# Patient Record
Sex: Female | Born: 1960 | Race: White | Hispanic: No | Smoking: Former smoker
Health system: Southern US, Community
[De-identification: ages and names within clinical notes are randomized; demographics above are authoritative.]

## PROBLEM LIST (undated history)

## (undated) DIAGNOSIS — J219 Acute bronchiolitis, unspecified: Secondary | ICD-10-CM

## (undated) DIAGNOSIS — R87619 Unspecified abnormal cytological findings in specimens from cervix uteri: Secondary | ICD-10-CM

## (undated) DIAGNOSIS — C801 Malignant (primary) neoplasm, unspecified: Secondary | ICD-10-CM

## (undated) DIAGNOSIS — F329 Major depressive disorder, single episode, unspecified: Secondary | ICD-10-CM

## (undated) DIAGNOSIS — A64 Unspecified sexually transmitted disease: Secondary | ICD-10-CM

## (undated) DIAGNOSIS — L0390 Cellulitis, unspecified: Secondary | ICD-10-CM

## (undated) DIAGNOSIS — D219 Benign neoplasm of connective and other soft tissue, unspecified: Secondary | ICD-10-CM

## (undated) DIAGNOSIS — F32A Depression, unspecified: Secondary | ICD-10-CM

## (undated) DIAGNOSIS — F418 Other specified anxiety disorders: Secondary | ICD-10-CM

## (undated) DIAGNOSIS — F1011 Alcohol abuse, in remission: Secondary | ICD-10-CM

## (undated) DIAGNOSIS — D693 Immune thrombocytopenic purpura: Secondary | ICD-10-CM

## (undated) DIAGNOSIS — N939 Abnormal uterine and vaginal bleeding, unspecified: Secondary | ICD-10-CM

## (undated) HISTORY — DX: Abnormal uterine and vaginal bleeding, unspecified: N93.9

## (undated) HISTORY — DX: Cellulitis, unspecified: L03.90

## (undated) HISTORY — DX: Malignant (primary) neoplasm, unspecified: C80.1

## (undated) HISTORY — DX: Major depressive disorder, single episode, unspecified: F32.9

## (undated) HISTORY — DX: Unspecified sexually transmitted disease: A64

## (undated) HISTORY — DX: Depression, unspecified: F32.A

## (undated) HISTORY — DX: Other specified anxiety disorders: F41.8

## (undated) HISTORY — DX: Acute bronchiolitis, unspecified: J21.9

## (undated) HISTORY — DX: Alcohol abuse, in remission: F10.11

## (undated) HISTORY — PX: OTHER SURGICAL HISTORY: SHX169

## (undated) HISTORY — DX: Immune thrombocytopenic purpura: D69.3

## (undated) HISTORY — DX: Benign neoplasm of connective and other soft tissue, unspecified: D21.9

## (undated) HISTORY — DX: Unspecified abnormal cytological findings in specimens from cervix uteri: R87.619

## (undated) HISTORY — PX: ENDOMETRIAL BIOPSY: SHX622

---

## 1990-01-04 DIAGNOSIS — D219 Benign neoplasm of connective and other soft tissue, unspecified: Secondary | ICD-10-CM

## 1990-01-04 HISTORY — DX: Benign neoplasm of connective and other soft tissue, unspecified: D21.9

## 2000-01-05 HISTORY — PX: BREAST SURGERY: SHX581

## 2000-09-04 HISTORY — PX: BONE MARROW BIOPSY: SHX199

## 2005-11-24 ENCOUNTER — Ambulatory Visit: Payer: Self-pay | Admitting: Oncology

## 2005-12-08 LAB — COMPREHENSIVE METABOLIC PANEL
ALT: 14 U/L (ref 0–35)
AST: 13 U/L (ref 0–37)
Albumin: 4 g/dL (ref 3.5–5.2)
Alkaline Phosphatase: 37 U/L — ABNORMAL LOW (ref 39–117)
BUN: 9 mg/dL (ref 6–23)
CO2: 30 mEq/L (ref 19–32)
Calcium: 9.4 mg/dL (ref 8.4–10.5)
Chloride: 106 mEq/L (ref 96–112)
Creatinine, Ser: 0.66 mg/dL (ref 0.40–1.20)
Glucose, Bld: 77 mg/dL (ref 70–99)
Potassium: 4.4 mEq/L (ref 3.5–5.3)
Sodium: 143 mEq/L (ref 135–145)
Total Bilirubin: 0.6 mg/dL (ref 0.3–1.2)
Total Protein: 6.2 g/dL (ref 6.0–8.3)

## 2005-12-08 LAB — MORPHOLOGY
PLT EST: DECREASED
RBC Comments: NORMAL

## 2005-12-08 LAB — CBC & DIFF AND RETIC
BASO%: 0.4 % (ref 0.0–2.0)
Basophils Absolute: 0.1 10*3/uL (ref 0.0–0.1)
EOS%: 0.7 % (ref 0.0–7.0)
Eosinophils Absolute: 0.1 10*3/uL (ref 0.0–0.5)
HCT: 40.7 % (ref 34.8–46.6)
HGB: 13.6 g/dL (ref 11.6–15.9)
IRF: 0.32 (ref 0.130–0.330)
LYMPH%: 22.2 % (ref 14.0–48.0)
MCH: 30.6 pg (ref 26.0–34.0)
MCHC: 33.5 g/dL (ref 32.0–36.0)
MCV: 91.3 fL (ref 81.0–101.0)
MONO#: 1.4 10*3/uL — ABNORMAL HIGH (ref 0.1–0.9)
MONO%: 10.1 % (ref 0.0–13.0)
NEUT#: 8.9 10*3/uL — ABNORMAL HIGH (ref 1.5–6.5)
NEUT%: 66.6 % (ref 39.6–76.8)
Platelets: 34 10*3/uL — ABNORMAL LOW (ref 145–400)
RBC: 4.46 10*6/uL (ref 3.70–5.32)
RDW: 13.9 % (ref 11.3–14.5)
RETIC #: 38.4 10*3/uL (ref 19.7–115.1)
Retic %: 0.9 % (ref 0.4–2.3)
WBC: 13.4 10*3/uL — ABNORMAL HIGH (ref 3.9–10.0)
lymph#: 3 10*3/uL (ref 0.9–3.3)

## 2005-12-08 LAB — LACTATE DEHYDROGENASE: LDH: 169 U/L (ref 94–250)

## 2005-12-08 LAB — CHCC SMEAR

## 2005-12-08 LAB — ERYTHROCYTE SEDIMENTATION RATE: Sed Rate: 4 mm/hr (ref 0–30)

## 2006-03-15 ENCOUNTER — Emergency Department (HOSPITAL_COMMUNITY): Admission: EM | Admit: 2006-03-15 | Discharge: 2006-03-15 | Payer: Self-pay | Admitting: Emergency Medicine

## 2007-01-09 ENCOUNTER — Other Ambulatory Visit: Admission: RE | Admit: 2007-01-09 | Discharge: 2007-01-09 | Payer: Self-pay | Admitting: Obstetrics & Gynecology

## 2007-02-17 ENCOUNTER — Encounter: Admission: RE | Admit: 2007-02-17 | Discharge: 2007-02-17 | Payer: Self-pay | Admitting: General Surgery

## 2007-03-20 ENCOUNTER — Ambulatory Visit (HOSPITAL_COMMUNITY): Admission: RE | Admit: 2007-03-20 | Discharge: 2007-03-20 | Payer: Self-pay | Admitting: General Surgery

## 2007-03-20 ENCOUNTER — Encounter (INDEPENDENT_AMBULATORY_CARE_PROVIDER_SITE_OTHER): Payer: Self-pay | Admitting: General Surgery

## 2007-04-10 ENCOUNTER — Ambulatory Visit: Payer: Self-pay | Admitting: Oncology

## 2007-04-21 LAB — CBC WITH DIFFERENTIAL/PLATELET
BASO%: 0.5 % (ref 0.0–2.0)
Basophils Absolute: 0 10*3/uL (ref 0.0–0.1)
EOS%: 1.7 % (ref 0.0–7.0)
Eosinophils Absolute: 0.1 10*3/uL (ref 0.0–0.5)
HCT: 38.4 % (ref 34.8–46.6)
HGB: 13.3 g/dL (ref 11.6–15.9)
LYMPH%: 21.4 % (ref 14.0–48.0)
MCH: 29.5 pg (ref 26.0–34.0)
MCHC: 34.6 g/dL (ref 32.0–36.0)
MCV: 85.3 fL (ref 81.0–101.0)
MONO#: 0.6 10*3/uL (ref 0.1–0.9)
MONO%: 11 % (ref 0.0–13.0)
NEUT#: 3.4 10*3/uL (ref 1.5–6.5)
NEUT%: 65.4 % (ref 39.6–76.8)
Platelets: 11 10*3/uL — ABNORMAL LOW (ref 145–400)
RBC: 4.5 10*6/uL (ref 3.70–5.32)
RDW: 13.8 % (ref 11.3–14.5)
WBC: 5.1 10*3/uL (ref 3.9–10.0)
lymph#: 1.1 10*3/uL (ref 0.9–3.3)

## 2007-04-21 LAB — COMPREHENSIVE METABOLIC PANEL
ALT: 12 U/L (ref 0–35)
AST: 16 U/L (ref 0–37)
Albumin: 4.1 g/dL (ref 3.5–5.2)
Alkaline Phosphatase: 60 U/L (ref 39–117)
BUN: 6 mg/dL (ref 6–23)
CO2: 26 mEq/L (ref 19–32)
Calcium: 9 mg/dL (ref 8.4–10.5)
Chloride: 106 mEq/L (ref 96–112)
Creatinine, Ser: 0.68 mg/dL (ref 0.40–1.20)
Glucose, Bld: 60 mg/dL — ABNORMAL LOW (ref 70–99)
Potassium: 3.7 mEq/L (ref 3.5–5.3)
Sodium: 142 mEq/L (ref 135–145)
Total Bilirubin: 0.6 mg/dL (ref 0.3–1.2)
Total Protein: 6.6 g/dL (ref 6.0–8.3)

## 2007-04-21 LAB — LACTATE DEHYDROGENASE: LDH: 144 U/L (ref 94–250)

## 2007-04-26 LAB — CBC & DIFF AND RETIC
BASO%: 1 % (ref 0.0–2.0)
Basophils Absolute: 0.1 10*3/uL (ref 0.0–0.1)
EOS%: 1 % (ref 0.0–7.0)
Eosinophils Absolute: 0.1 10*3/uL (ref 0.0–0.5)
HCT: 36.3 % (ref 34.8–46.6)
HGB: 12.7 g/dL (ref 11.6–15.9)
IRF: 0.42 — ABNORMAL HIGH (ref 0.130–0.330)
LYMPH%: 22.1 % (ref 14.0–48.0)
MCH: 29.8 pg (ref 26.0–34.0)
MCHC: 35.1 g/dL (ref 32.0–36.0)
MCV: 84.7 fL (ref 81.0–101.0)
MONO#: 0.5 10*3/uL (ref 0.1–0.9)
MONO%: 7.8 % (ref 0.0–13.0)
NEUT#: 4.4 10*3/uL (ref 1.5–6.5)
NEUT%: 68.1 % (ref 39.6–76.8)
Platelets: 39 10*3/uL — ABNORMAL LOW (ref 145–400)
RBC: 4.28 10*6/uL (ref 3.70–5.32)
RDW: 14 % (ref 11.3–14.5)
RETIC #: 64.6 10*3/uL (ref 19.7–115.1)
Retic %: 1.5 % (ref 0.4–2.3)
WBC: 6.5 10*3/uL (ref 3.9–10.0)
lymph#: 1.4 10*3/uL (ref 0.9–3.3)

## 2007-04-26 LAB — MORPHOLOGY
PLT EST: DECREASED
RBC Comments: NORMAL

## 2007-04-26 LAB — CHCC SMEAR

## 2007-05-02 LAB — CBC WITH DIFFERENTIAL/PLATELET
BASO%: 0.4 % (ref 0.0–2.0)
Basophils Absolute: 0 10*3/uL (ref 0.0–0.1)
EOS%: 1.9 % (ref 0.0–7.0)
Eosinophils Absolute: 0.1 10*3/uL (ref 0.0–0.5)
HCT: 35.1 % (ref 34.8–46.6)
HGB: 12.2 g/dL (ref 11.6–15.9)
LYMPH%: 19.8 % (ref 14.0–48.0)
MCH: 29.8 pg (ref 26.0–34.0)
MCHC: 34.8 g/dL (ref 32.0–36.0)
MCV: 85.7 fL (ref 81.0–101.0)
MONO#: 0.7 10*3/uL (ref 0.1–0.9)
MONO%: 11.2 % (ref 0.0–13.0)
NEUT#: 4 10*3/uL (ref 1.5–6.5)
NEUT%: 66.7 % (ref 39.6–76.8)
Platelets: 24 10*3/uL — ABNORMAL LOW (ref 145–400)
RBC: 4.09 10*6/uL (ref 3.70–5.32)
RDW: 14.8 % — ABNORMAL HIGH (ref 11.3–14.5)
WBC: 5.9 10*3/uL (ref 3.9–10.0)
lymph#: 1.2 10*3/uL (ref 0.9–3.3)

## 2007-05-16 LAB — CBC WITH DIFFERENTIAL/PLATELET
BASO%: 0 % (ref 0.0–2.0)
Basophils Absolute: 0 10*3/uL (ref 0.0–0.1)
EOS%: 4 % (ref 0.0–7.0)
Eosinophils Absolute: 0.2 10*3/uL (ref 0.0–0.5)
HCT: 36.6 % (ref 34.8–46.6)
HGB: 12.6 g/dL (ref 11.6–15.9)
LYMPH%: 21.1 % (ref 14.0–48.0)
MCH: 29.9 pg (ref 26.0–34.0)
MCHC: 34.4 g/dL (ref 32.0–36.0)
MCV: 87.1 fL (ref 81.0–101.0)
MONO#: 0.6 10*3/uL (ref 0.1–0.9)
MONO%: 10.5 % (ref 0.0–13.0)
NEUT#: 3.8 10*3/uL (ref 1.5–6.5)
NEUT%: 64.4 % (ref 39.6–76.8)
Platelets: 17 10*3/uL — ABNORMAL LOW (ref 145–400)
RBC: 4.2 10*6/uL (ref 3.70–5.32)
RDW: 15 % — ABNORMAL HIGH (ref 11.3–14.5)
WBC: 5.9 10*3/uL (ref 3.9–10.0)
lymph#: 1.2 10*3/uL (ref 0.9–3.3)

## 2007-05-24 ENCOUNTER — Ambulatory Visit: Payer: Self-pay | Admitting: Oncology

## 2007-06-06 LAB — CBC WITH DIFFERENTIAL/PLATELET
BASO%: 1.2 % (ref 0.0–2.0)
Basophils Absolute: 0.1 10*3/uL (ref 0.0–0.1)
EOS%: 1.7 % (ref 0.0–7.0)
Eosinophils Absolute: 0.1 10*3/uL (ref 0.0–0.5)
HCT: 35.1 % (ref 34.8–46.6)
HGB: 12.1 g/dL (ref 11.6–15.9)
LYMPH%: 19.5 % (ref 14.0–48.0)
MCH: 29.9 pg (ref 26.0–34.0)
MCHC: 34.5 g/dL (ref 32.0–36.0)
MCV: 86.4 fL (ref 81.0–101.0)
MONO#: 0.5 10*3/uL (ref 0.1–0.9)
MONO%: 9.6 % (ref 0.0–13.0)
NEUT#: 3.9 10*3/uL (ref 1.5–6.5)
NEUT%: 68 % (ref 39.6–76.8)
Platelets: 57 10*3/uL — ABNORMAL LOW (ref 145–400)
RBC: 4.07 10*6/uL (ref 3.70–5.32)
RDW: 14.1 % (ref 11.3–14.5)
WBC: 5.7 10*3/uL (ref 3.9–10.0)
lymph#: 1.1 10*3/uL (ref 0.9–3.3)

## 2007-06-13 LAB — CBC WITH DIFFERENTIAL/PLATELET
BASO%: 0.7 % (ref 0.0–2.0)
Basophils Absolute: 0 10*3/uL (ref 0.0–0.1)
EOS%: 1.7 % (ref 0.0–7.0)
Eosinophils Absolute: 0.1 10*3/uL (ref 0.0–0.5)
HCT: 38 % (ref 34.8–46.6)
HGB: 12.8 g/dL (ref 11.6–15.9)
LYMPH%: 24.9 % (ref 14.0–48.0)
MCH: 29.1 pg (ref 26.0–34.0)
MCHC: 33.7 g/dL (ref 32.0–36.0)
MCV: 86.2 fL (ref 81.0–101.0)
MONO#: 0.6 10*3/uL (ref 0.1–0.9)
MONO%: 11.9 % (ref 0.0–13.0)
NEUT#: 3.2 10*3/uL (ref 1.5–6.5)
NEUT%: 60.8 % (ref 39.6–76.8)
Platelets: 113 10*3/uL — ABNORMAL LOW (ref 145–400)
RBC: 4.4 10*6/uL (ref 3.70–5.32)
RDW: 13.9 % (ref 11.3–14.5)
WBC: 5.2 10*3/uL (ref 3.9–10.0)
lymph#: 1.3 10*3/uL (ref 0.9–3.3)

## 2007-06-19 ENCOUNTER — Ambulatory Visit: Admission: RE | Admit: 2007-06-19 | Discharge: 2007-08-27 | Payer: Self-pay | Admitting: Radiation Oncology

## 2007-07-05 DIAGNOSIS — C801 Malignant (primary) neoplasm, unspecified: Secondary | ICD-10-CM

## 2007-07-05 HISTORY — DX: Malignant (primary) neoplasm, unspecified: C80.1

## 2007-07-13 ENCOUNTER — Ambulatory Visit: Payer: Self-pay | Admitting: Oncology

## 2007-07-18 LAB — COMPREHENSIVE METABOLIC PANEL
ALT: 13 U/L (ref 0–35)
AST: 16 U/L (ref 0–37)
Albumin: 4 g/dL (ref 3.5–5.2)
Alkaline Phosphatase: 61 U/L (ref 39–117)
BUN: 7 mg/dL (ref 6–23)
CO2: 25 mEq/L (ref 19–32)
Calcium: 8.9 mg/dL (ref 8.4–10.5)
Chloride: 107 mEq/L (ref 96–112)
Creatinine, Ser: 0.7 mg/dL (ref 0.40–1.20)
Glucose, Bld: 76 mg/dL (ref 70–99)
Potassium: 4.2 mEq/L (ref 3.5–5.3)
Sodium: 139 mEq/L (ref 135–145)
Total Bilirubin: 0.6 mg/dL (ref 0.3–1.2)
Total Protein: 6.2 g/dL (ref 6.0–8.3)

## 2007-07-18 LAB — CBC WITH DIFFERENTIAL/PLATELET
BASO%: 1.5 % (ref 0.0–2.0)
Basophils Absolute: 0.1 10*3/uL (ref 0.0–0.1)
EOS%: 5 % (ref 0.0–7.0)
Eosinophils Absolute: 0.3 10*3/uL (ref 0.0–0.5)
HCT: 36.3 % (ref 34.8–46.6)
HGB: 12.4 g/dL (ref 11.6–15.9)
LYMPH%: 19 % (ref 14.0–48.0)
MCH: 28.8 pg (ref 26.0–34.0)
MCHC: 34.2 g/dL (ref 32.0–36.0)
MCV: 84.2 fL (ref 81.0–101.0)
MONO#: 0.6 10*3/uL (ref 0.1–0.9)
MONO%: 11.4 % (ref 0.0–13.0)
NEUT#: 3.4 10*3/uL (ref 1.5–6.5)
NEUT%: 63.1 % (ref 39.6–76.8)
Platelets: 23 10*3/uL — ABNORMAL LOW (ref 145–400)
RBC: 4.32 10*6/uL (ref 3.70–5.32)
RDW: 13.6 % (ref 11.3–14.5)
WBC: 5.4 10*3/uL (ref 3.9–10.0)
lymph#: 1 10*3/uL (ref 0.9–3.3)

## 2007-07-18 LAB — LACTATE DEHYDROGENASE: LDH: 159 U/L (ref 94–250)

## 2007-08-03 ENCOUNTER — Ambulatory Visit (HOSPITAL_COMMUNITY): Admission: RE | Admit: 2007-08-03 | Discharge: 2007-08-03 | Payer: Self-pay | Admitting: Oncology

## 2007-08-03 LAB — CBC WITH DIFFERENTIAL/PLATELET
BASO%: 1.2 % (ref 0.0–2.0)
Basophils Absolute: 0.1 10*3/uL (ref 0.0–0.1)
EOS%: 6.3 % (ref 0.0–7.0)
Eosinophils Absolute: 0.4 10*3/uL (ref 0.0–0.5)
HCT: 38.3 % (ref 34.8–46.6)
HGB: 13.1 g/dL (ref 11.6–15.9)
LYMPH%: 15.3 % (ref 14.0–48.0)
MCH: 29.4 pg (ref 26.0–34.0)
MCHC: 34.2 g/dL (ref 32.0–36.0)
MCV: 86 fL (ref 81.0–101.0)
MONO#: 0.7 10*3/uL (ref 0.1–0.9)
MONO%: 12.2 % (ref 0.0–13.0)
NEUT#: 3.6 10*3/uL (ref 1.5–6.5)
NEUT%: 65 % (ref 39.6–76.8)
Platelets: 8 10*3/uL — CL (ref 145–400)
RBC: 4.45 10*6/uL (ref 3.70–5.32)
RDW: 14.3 % (ref 11.3–14.5)
WBC: 5.6 10*3/uL (ref 3.9–10.0)
lymph#: 0.9 10*3/uL (ref 0.9–3.3)

## 2007-08-10 LAB — CBC WITH DIFFERENTIAL/PLATELET
BASO%: 1.6 % (ref 0.0–2.0)
Basophils Absolute: 0.1 10*3/uL (ref 0.0–0.1)
EOS%: 6 % (ref 0.0–7.0)
Eosinophils Absolute: 0.3 10*3/uL (ref 0.0–0.5)
HCT: 36.4 % (ref 34.8–46.6)
HGB: 12.4 g/dL (ref 11.6–15.9)
LYMPH%: 14 % (ref 14.0–48.0)
MCH: 29.3 pg (ref 26.0–34.0)
MCHC: 34 g/dL (ref 32.0–36.0)
MCV: 86.4 fL (ref 81.0–101.0)
MONO#: 0.7 10*3/uL (ref 0.1–0.9)
MONO%: 12.7 % (ref 0.0–13.0)
NEUT#: 3.6 10*3/uL (ref 1.5–6.5)
NEUT%: 65.7 % (ref 39.6–76.8)
Platelets: 165 10*3/uL (ref 145–400)
RBC: 4.21 10*6/uL (ref 3.70–5.32)
RDW: 15.6 % — ABNORMAL HIGH (ref 11.3–14.5)
WBC: 5.4 10*3/uL (ref 3.9–10.0)
lymph#: 0.8 10*3/uL — ABNORMAL LOW (ref 0.9–3.3)

## 2007-08-24 LAB — CBC WITH DIFFERENTIAL/PLATELET
BASO%: 1.6 % (ref 0.0–2.0)
Basophils Absolute: 0.1 10*3/uL (ref 0.0–0.1)
EOS%: 6.7 % (ref 0.0–7.0)
Eosinophils Absolute: 0.4 10*3/uL (ref 0.0–0.5)
HCT: 37.4 % (ref 34.8–46.6)
HGB: 12.8 g/dL (ref 11.6–15.9)
LYMPH%: 12.9 % — ABNORMAL LOW (ref 14.0–48.0)
MCH: 30.3 pg (ref 26.0–34.0)
MCHC: 34.3 g/dL (ref 32.0–36.0)
MCV: 88.3 fL (ref 81.0–101.0)
MONO#: 0.6 10*3/uL (ref 0.1–0.9)
MONO%: 11.6 % (ref 0.0–13.0)
NEUT#: 3.7 10*3/uL (ref 1.5–6.5)
NEUT%: 67.2 % (ref 39.6–76.8)
Platelets: 65 10*3/uL — ABNORMAL LOW (ref 145–400)
RBC: 4.24 10*6/uL (ref 3.70–5.32)
RDW: 16.7 % — ABNORMAL HIGH (ref 11.3–14.5)
WBC: 5.6 10*3/uL (ref 3.9–10.0)
lymph#: 0.7 10*3/uL — ABNORMAL LOW (ref 0.9–3.3)

## 2007-08-29 ENCOUNTER — Ambulatory Visit: Payer: Self-pay | Admitting: Oncology

## 2007-09-07 LAB — CBC WITH DIFFERENTIAL/PLATELET
BASO%: 1 % (ref 0.0–2.0)
Basophils Absolute: 0.1 10*3/uL (ref 0.0–0.1)
EOS%: 6 % (ref 0.0–7.0)
Eosinophils Absolute: 0.4 10*3/uL (ref 0.0–0.5)
HCT: 36.2 % (ref 34.8–46.6)
HGB: 12.4 g/dL (ref 11.6–15.9)
LYMPH%: 17.3 % (ref 14.0–48.0)
MCH: 30.3 pg (ref 26.0–34.0)
MCHC: 34.3 g/dL (ref 32.0–36.0)
MCV: 88.3 fL (ref 81.0–101.0)
MONO#: 0.6 10*3/uL (ref 0.1–0.9)
MONO%: 9.4 % (ref 0.0–13.0)
NEUT#: 4.5 10*3/uL (ref 1.5–6.5)
NEUT%: 66.3 % (ref 39.6–76.8)
Platelets: 26 10*3/uL — ABNORMAL LOW (ref 145–400)
RBC: 4.1 10*6/uL (ref 3.70–5.32)
RDW: 16.1 % — ABNORMAL HIGH (ref 11.3–14.5)
WBC: 6.8 10*3/uL (ref 3.9–10.0)
lymph#: 1.2 10*3/uL (ref 0.9–3.3)

## 2007-09-13 LAB — CBC WITH DIFFERENTIAL/PLATELET
BASO%: 0.7 % (ref 0.0–2.0)
Basophils Absolute: 0 10*3/uL (ref 0.0–0.1)
EOS%: 5.6 % (ref 0.0–7.0)
Eosinophils Absolute: 0.3 10*3/uL (ref 0.0–0.5)
HCT: 36.6 % (ref 34.8–46.6)
HGB: 12.6 g/dL (ref 11.6–15.9)
LYMPH%: 17.3 % (ref 14.0–48.0)
MCH: 30.7 pg (ref 26.0–34.0)
MCHC: 34.5 g/dL (ref 32.0–36.0)
MCV: 89.2 fL (ref 81.0–101.0)
MONO#: 0.5 10*3/uL (ref 0.1–0.9)
MONO%: 9.5 % (ref 0.0–13.0)
NEUT#: 3.7 10*3/uL (ref 1.5–6.5)
NEUT%: 66.9 % (ref 39.6–76.8)
Platelets: 43 10*3/uL — ABNORMAL LOW (ref 145–400)
RBC: 4.11 10*6/uL (ref 3.70–5.32)
RDW: 15.5 % — ABNORMAL HIGH (ref 11.3–14.5)
WBC: 5.5 10*3/uL (ref 3.9–10.0)
lymph#: 1 10*3/uL (ref 0.9–3.3)

## 2007-09-21 LAB — CBC WITH DIFFERENTIAL/PLATELET
BASO%: 1.6 % (ref 0.0–2.0)
Basophils Absolute: 0.1 10*3/uL (ref 0.0–0.1)
EOS%: 5.5 % (ref 0.0–7.0)
Eosinophils Absolute: 0.3 10*3/uL (ref 0.0–0.5)
HCT: 38.9 % (ref 34.8–46.6)
HGB: 13.1 g/dL (ref 11.6–15.9)
LYMPH%: 16.9 % (ref 14.0–48.0)
MCH: 30.1 pg (ref 26.0–34.0)
MCHC: 33.8 g/dL (ref 32.0–36.0)
MCV: 89.2 fL (ref 81.0–101.0)
MONO#: 0.7 10*3/uL (ref 0.1–0.9)
MONO%: 13.1 % — ABNORMAL HIGH (ref 0.0–13.0)
NEUT#: 3.1 10*3/uL (ref 1.5–6.5)
NEUT%: 62.9 % (ref 39.6–76.8)
Platelets: 196 10*3/uL (ref 145–400)
RBC: 4.36 10*6/uL (ref 3.70–5.32)
RDW: 15 % — ABNORMAL HIGH (ref 11.3–14.5)
WBC: 5 10*3/uL (ref 3.9–10.0)
lymph#: 0.8 10*3/uL — ABNORMAL LOW (ref 0.9–3.3)

## 2007-09-28 LAB — CBC WITH DIFFERENTIAL/PLATELET
BASO%: 1.6 % (ref 0.0–2.0)
Basophils Absolute: 0.1 10*3/uL (ref 0.0–0.1)
EOS%: 2.5 % (ref 0.0–7.0)
Eosinophils Absolute: 0.1 10*3/uL (ref 0.0–0.5)
HCT: 38.7 % (ref 34.8–46.6)
HGB: 13.2 g/dL (ref 11.6–15.9)
LYMPH%: 18.5 % (ref 14.0–48.0)
MCH: 29.9 pg (ref 26.0–34.0)
MCHC: 34 g/dL (ref 32.0–36.0)
MCV: 87.9 fL (ref 81.0–101.0)
MONO#: 0.5 10*3/uL (ref 0.1–0.9)
MONO%: 10.6 % (ref 0.0–13.0)
NEUT#: 2.9 10*3/uL (ref 1.5–6.5)
NEUT%: 66.8 % (ref 39.6–76.8)
Platelets: 156 10*3/uL (ref 145–400)
RBC: 4.4 10*6/uL (ref 3.70–5.32)
RDW: 14.2 % (ref 11.3–14.5)
WBC: 4.3 10*3/uL (ref 3.9–10.0)
lymph#: 0.8 10*3/uL — ABNORMAL LOW (ref 0.9–3.3)

## 2007-10-05 LAB — CBC WITH DIFFERENTIAL/PLATELET
BASO%: 1.1 % (ref 0.0–2.0)
Basophils Absolute: 0 10*3/uL (ref 0.0–0.1)
EOS%: 2.7 % (ref 0.0–7.0)
Eosinophils Absolute: 0.1 10*3/uL (ref 0.0–0.5)
HCT: 40.5 % (ref 34.8–46.6)
HGB: 13.7 g/dL (ref 11.6–15.9)
LYMPH%: 16.4 % (ref 14.0–48.0)
MCH: 29.9 pg (ref 26.0–34.0)
MCHC: 33.9 g/dL (ref 32.0–36.0)
MCV: 88.3 fL (ref 81.0–101.0)
MONO#: 0.6 10*3/uL (ref 0.1–0.9)
MONO%: 12.4 % (ref 0.0–13.0)
NEUT#: 3.1 10*3/uL (ref 1.5–6.5)
NEUT%: 67.4 % (ref 39.6–76.8)
Platelets: 88 10*3/uL — ABNORMAL LOW (ref 145–400)
RBC: 4.58 10*6/uL (ref 3.70–5.32)
RDW: 14.5 % (ref 11.3–14.5)
WBC: 4.5 10*3/uL (ref 3.9–10.0)
lymph#: 0.7 10*3/uL — ABNORMAL LOW (ref 0.9–3.3)

## 2007-10-30 ENCOUNTER — Ambulatory Visit: Payer: Self-pay | Admitting: Oncology

## 2007-11-01 LAB — CBC WITH DIFFERENTIAL/PLATELET
BASO%: 0.6 % (ref 0.0–2.0)
Basophils Absolute: 0 10*3/uL (ref 0.0–0.1)
EOS%: 2.1 % (ref 0.0–7.0)
Eosinophils Absolute: 0.1 10*3/uL (ref 0.0–0.5)
HCT: 40.4 % (ref 34.8–46.6)
HGB: 13.7 g/dL (ref 11.6–15.9)
LYMPH%: 18.1 % (ref 14.0–48.0)
MCH: 29.8 pg (ref 26.0–34.0)
MCHC: 34 g/dL (ref 32.0–36.0)
MCV: 87.7 fL (ref 81.0–101.0)
MONO#: 0.5 10*3/uL (ref 0.1–0.9)
MONO%: 9.3 % (ref 0.0–13.0)
NEUT#: 3.7 10*3/uL (ref 1.5–6.5)
NEUT%: 69.9 % (ref 39.6–76.8)
Platelets: 11 10*3/uL — ABNORMAL LOW (ref 145–400)
RBC: 4.61 10*6/uL (ref 3.70–5.32)
RDW: 13.8 % (ref 11.3–14.5)
WBC: 5.4 10*3/uL (ref 3.9–10.0)
lymph#: 1 10*3/uL (ref 0.9–3.3)

## 2007-11-01 LAB — COMPREHENSIVE METABOLIC PANEL
ALT: 12 U/L (ref 0–35)
AST: 18 U/L (ref 0–37)
Albumin: 4 g/dL (ref 3.5–5.2)
Alkaline Phosphatase: 62 U/L (ref 39–117)
BUN: 8 mg/dL (ref 6–23)
CO2: 28 mEq/L (ref 19–32)
Calcium: 9.2 mg/dL (ref 8.4–10.5)
Chloride: 107 mEq/L (ref 96–112)
Creatinine, Ser: 0.7 mg/dL (ref 0.40–1.20)
Glucose, Bld: 79 mg/dL (ref 70–99)
Potassium: 4.2 mEq/L (ref 3.5–5.3)
Sodium: 144 mEq/L (ref 135–145)
Total Bilirubin: 0.6 mg/dL (ref 0.3–1.2)
Total Protein: 6.3 g/dL (ref 6.0–8.3)

## 2007-11-02 ENCOUNTER — Ambulatory Visit (HOSPITAL_COMMUNITY): Admission: RE | Admit: 2007-11-02 | Discharge: 2007-11-02 | Payer: Self-pay | Admitting: Oncology

## 2007-11-09 LAB — CBC WITH DIFFERENTIAL/PLATELET
BASO%: 0.7 % (ref 0.0–2.0)
Basophils Absolute: 0 10*3/uL (ref 0.0–0.1)
EOS%: 1.6 % (ref 0.0–7.0)
Eosinophils Absolute: 0.1 10*3/uL (ref 0.0–0.5)
HCT: 37.7 % (ref 34.8–46.6)
HGB: 12.9 g/dL (ref 11.6–15.9)
LYMPH%: 18.6 % (ref 14.0–48.0)
MCH: 29.9 pg (ref 26.0–34.0)
MCHC: 34.1 g/dL (ref 32.0–36.0)
MCV: 87.5 fL (ref 81.0–101.0)
MONO#: 0.7 10*3/uL (ref 0.1–0.9)
MONO%: 11.5 % (ref 0.0–13.0)
NEUT#: 4.2 10*3/uL (ref 1.5–6.5)
NEUT%: 67.6 % (ref 39.6–76.8)
Platelets: 105 10*3/uL — ABNORMAL LOW (ref 145–400)
RBC: 4.31 10*6/uL (ref 3.70–5.32)
RDW: 14.2 % (ref 11.3–14.5)
WBC: 6.2 10*3/uL (ref 3.9–10.0)
lymph#: 1.1 10*3/uL (ref 0.9–3.3)

## 2007-11-15 LAB — CBC WITH DIFFERENTIAL/PLATELET
BASO%: 0.7 % (ref 0.0–2.0)
Basophils Absolute: 0 10*3/uL (ref 0.0–0.1)
EOS%: 1.4 % (ref 0.0–7.0)
Eosinophils Absolute: 0.1 10*3/uL (ref 0.0–0.5)
HCT: 39.5 % (ref 34.8–46.6)
HGB: 13.4 g/dL (ref 11.6–15.9)
LYMPH%: 18.4 % (ref 14.0–48.0)
MCH: 29.7 pg (ref 26.0–34.0)
MCHC: 34 g/dL (ref 32.0–36.0)
MCV: 87.4 fL (ref 81.0–101.0)
MONO#: 0.8 10*3/uL (ref 0.1–0.9)
MONO%: 12.1 % (ref 0.0–13.0)
NEUT#: 4.3 10*3/uL (ref 1.5–6.5)
NEUT%: 67.4 % (ref 39.6–76.8)
Platelets: 181 10*3/uL (ref 145–400)
RBC: 4.51 10*6/uL (ref 3.70–5.32)
RDW: 13.8 % (ref 11.3–14.5)
WBC: 6.4 10*3/uL (ref 3.9–10.0)
lymph#: 1.2 10*3/uL (ref 0.9–3.3)

## 2007-11-22 LAB — CBC WITH DIFFERENTIAL/PLATELET
BASO%: 0.7 % (ref 0.0–2.0)
Basophils Absolute: 0 10*3/uL (ref 0.0–0.1)
EOS%: 1.7 % (ref 0.0–7.0)
Eosinophils Absolute: 0.1 10*3/uL (ref 0.0–0.5)
HCT: 36.3 % (ref 34.8–46.6)
HGB: 12.3 g/dL (ref 11.6–15.9)
LYMPH%: 17.3 % (ref 14.0–48.0)
MCH: 29.6 pg (ref 26.0–34.0)
MCHC: 34 g/dL (ref 32.0–36.0)
MCV: 87.3 fL (ref 81.0–101.0)
MONO#: 0.5 10*3/uL (ref 0.1–0.9)
MONO%: 8.7 % (ref 0.0–13.0)
NEUT#: 4.5 10*3/uL (ref 1.5–6.5)
NEUT%: 71.6 % (ref 39.6–76.8)
Platelets: 214 10*3/uL (ref 145–400)
RBC: 4.16 10*6/uL (ref 3.70–5.32)
RDW: 13.7 % (ref 11.3–14.5)
WBC: 6.3 10*3/uL (ref 3.9–10.0)
lymph#: 1.1 10*3/uL (ref 0.9–3.3)

## 2007-11-29 LAB — CBC WITH DIFFERENTIAL/PLATELET
BASO%: 0.5 % (ref 0.0–2.0)
Basophils Absolute: 0 10*3/uL (ref 0.0–0.1)
EOS%: 0.8 % (ref 0.0–7.0)
Eosinophils Absolute: 0.1 10*3/uL (ref 0.0–0.5)
HCT: 38.3 % (ref 34.8–46.6)
HGB: 13.1 g/dL (ref 11.6–15.9)
LYMPH%: 17.6 % (ref 14.0–48.0)
MCH: 29.6 pg (ref 26.0–34.0)
MCHC: 34.2 g/dL (ref 32.0–36.0)
MCV: 86.6 fL (ref 81.0–101.0)
MONO#: 0.7 10*3/uL (ref 0.1–0.9)
MONO%: 10 % (ref 0.0–13.0)
NEUT#: 5 10*3/uL (ref 1.5–6.5)
NEUT%: 71.1 % (ref 39.6–76.8)
Platelets: 76 10*3/uL — ABNORMAL LOW (ref 145–400)
RBC: 4.42 10*6/uL (ref 3.70–5.32)
RDW: 14.2 % (ref 11.3–14.5)
WBC: 7 10*3/uL (ref 3.9–10.0)
lymph#: 1.2 10*3/uL (ref 0.9–3.3)

## 2007-12-06 ENCOUNTER — Ambulatory Visit: Payer: Self-pay | Admitting: Psychiatry

## 2007-12-06 LAB — CBC WITH DIFFERENTIAL/PLATELET
BASO%: 1 % (ref 0.0–2.0)
Basophils Absolute: 0.1 10*3/uL (ref 0.0–0.1)
EOS%: 1.5 % (ref 0.0–7.0)
Eosinophils Absolute: 0.1 10*3/uL (ref 0.0–0.5)
HCT: 38.6 % (ref 34.8–46.6)
HGB: 13 g/dL (ref 11.6–15.9)
LYMPH%: 18.6 % (ref 14.0–48.0)
MCH: 29.3 pg (ref 26.0–34.0)
MCHC: 33.7 g/dL (ref 32.0–36.0)
MCV: 86.9 fL (ref 81.0–101.0)
MONO#: 0.8 10*3/uL (ref 0.1–0.9)
MONO%: 11.8 % (ref 0.0–13.0)
NEUT#: 4.4 10*3/uL (ref 1.5–6.5)
NEUT%: 67.1 % (ref 39.6–76.8)
Platelets: 134 10*3/uL — ABNORMAL LOW (ref 145–400)
RBC: 4.44 10*6/uL (ref 3.70–5.32)
RDW: 14.9 % — ABNORMAL HIGH (ref 11.3–14.5)
WBC: 6.5 10*3/uL (ref 3.9–10.0)
lymph#: 1.2 10*3/uL (ref 0.9–3.3)

## 2007-12-13 ENCOUNTER — Ambulatory Visit: Payer: Self-pay | Admitting: Psychiatry

## 2007-12-18 ENCOUNTER — Ambulatory Visit: Payer: Self-pay | Admitting: Oncology

## 2007-12-20 ENCOUNTER — Ambulatory Visit: Payer: Self-pay | Admitting: Psychiatry

## 2007-12-20 LAB — CBC WITH DIFFERENTIAL/PLATELET
BASO%: 0.7 % (ref 0.0–2.0)
Basophils Absolute: 0 10*3/uL (ref 0.0–0.1)
EOS%: 2.3 % (ref 0.0–7.0)
Eosinophils Absolute: 0.1 10*3/uL (ref 0.0–0.5)
HCT: 36.5 % (ref 34.8–46.6)
HGB: 12.4 g/dL (ref 11.6–15.9)
LYMPH%: 19 % (ref 14.0–48.0)
MCH: 29.9 pg (ref 26.0–34.0)
MCHC: 34 g/dL (ref 32.0–36.0)
MCV: 87.9 fL (ref 81.0–101.0)
MONO#: 0.6 10*3/uL (ref 0.1–0.9)
MONO%: 10.2 % (ref 0.0–13.0)
NEUT#: 3.9 10*3/uL (ref 1.5–6.5)
NEUT%: 67.8 % (ref 39.6–76.8)
Platelets: 58 10*3/uL — ABNORMAL LOW (ref 145–400)
RBC: 4.15 10*6/uL (ref 3.70–5.32)
RDW: 15.9 % — ABNORMAL HIGH (ref 11.3–14.5)
WBC: 5.8 10*3/uL (ref 3.9–10.0)
lymph#: 1.1 10*3/uL (ref 0.9–3.3)

## 2007-12-27 LAB — CBC WITH DIFFERENTIAL/PLATELET
BASO%: 0.8 % (ref 0.0–2.0)
Basophils Absolute: 0.1 10*3/uL (ref 0.0–0.1)
EOS%: 1.1 % (ref 0.0–7.0)
Eosinophils Absolute: 0.1 10*3/uL (ref 0.0–0.5)
HCT: 40 % (ref 34.8–46.6)
HGB: 13.4 g/dL (ref 11.6–15.9)
LYMPH%: 18.7 % (ref 14.0–48.0)
MCH: 29.3 pg (ref 26.0–34.0)
MCHC: 33.4 g/dL (ref 32.0–36.0)
MCV: 87.8 fL (ref 81.0–101.0)
MONO#: 0.6 10*3/uL (ref 0.1–0.9)
MONO%: 9.6 % (ref 0.0–13.0)
NEUT#: 4.5 10*3/uL (ref 1.5–6.5)
NEUT%: 69.8 % (ref 39.6–76.8)
Platelets: 185 10*3/uL (ref 145–400)
RBC: 4.55 10*6/uL (ref 3.70–5.32)
RDW: 15.3 % — ABNORMAL HIGH (ref 11.3–14.5)
WBC: 6.4 10*3/uL (ref 3.9–10.0)
lymph#: 1.2 10*3/uL (ref 0.9–3.3)

## 2008-01-03 LAB — CBC WITH DIFFERENTIAL/PLATELET
BASO%: 0.8 % (ref 0.0–2.0)
Basophils Absolute: 0.1 10*3/uL (ref 0.0–0.1)
EOS%: 2.3 % (ref 0.0–7.0)
Eosinophils Absolute: 0.2 10*3/uL (ref 0.0–0.5)
HCT: 37.5 % (ref 34.8–46.6)
HGB: 12.7 g/dL (ref 11.6–15.9)
LYMPH%: 13.1 % — ABNORMAL LOW (ref 14.0–48.0)
MCH: 29.5 pg (ref 26.0–34.0)
MCHC: 33.8 g/dL (ref 32.0–36.0)
MCV: 87.3 fL (ref 81.0–101.0)
MONO#: 0.8 10*3/uL (ref 0.1–0.9)
MONO%: 9.3 % (ref 0.0–13.0)
NEUT#: 6.5 10*3/uL (ref 1.5–6.5)
NEUT%: 74.5 % (ref 39.6–76.8)
Platelets: 112 10*3/uL — ABNORMAL LOW (ref 145–400)
RBC: 4.3 10*6/uL (ref 3.70–5.32)
RDW: 15.4 % — ABNORMAL HIGH (ref 11.3–14.5)
WBC: 8.8 10*3/uL (ref 3.9–10.0)
lymph#: 1.1 10*3/uL (ref 0.9–3.3)

## 2008-01-10 ENCOUNTER — Ambulatory Visit: Payer: Self-pay | Admitting: Oncology

## 2008-01-10 ENCOUNTER — Ambulatory Visit: Payer: Self-pay | Admitting: Psychiatry

## 2008-01-10 LAB — CBC WITH DIFFERENTIAL/PLATELET
BASO%: 0.3 % (ref 0.0–2.0)
Basophils Absolute: 0 10*3/uL (ref 0.0–0.1)
EOS%: 1.6 % (ref 0.0–7.0)
Eosinophils Absolute: 0.1 10*3/uL (ref 0.0–0.5)
HCT: 38.8 % (ref 34.8–46.6)
HGB: 13.1 g/dL (ref 11.6–15.9)
LYMPH%: 13.8 % — ABNORMAL LOW (ref 14.0–48.0)
MCH: 29.4 pg (ref 26.0–34.0)
MCHC: 33.8 g/dL (ref 32.0–36.0)
MCV: 86.9 fL (ref 81.0–101.0)
MONO#: 0.6 10*3/uL (ref 0.1–0.9)
MONO%: 8.4 % (ref 0.0–13.0)
NEUT#: 5.8 10*3/uL (ref 1.5–6.5)
NEUT%: 75.9 % (ref 39.6–76.8)
Platelets: 59 10*3/uL — ABNORMAL LOW (ref 145–400)
RBC: 4.46 10*6/uL (ref 3.70–5.32)
RDW: 15.4 % — ABNORMAL HIGH (ref 11.3–14.5)
WBC: 7.6 10*3/uL (ref 3.9–10.0)
lymph#: 1 10*3/uL (ref 0.9–3.3)

## 2008-01-17 ENCOUNTER — Ambulatory Visit: Payer: Self-pay | Admitting: Psychiatry

## 2008-01-17 LAB — CBC WITH DIFFERENTIAL/PLATELET
BASO%: 0.6 % (ref 0.0–2.0)
Basophils Absolute: 0.1 10*3/uL (ref 0.0–0.1)
EOS%: 1.2 % (ref 0.0–7.0)
Eosinophils Absolute: 0.1 10*3/uL (ref 0.0–0.5)
HCT: 39.3 % (ref 34.8–46.6)
HGB: 13.3 g/dL (ref 11.6–15.9)
LYMPH%: 14.5 % (ref 14.0–48.0)
MCH: 29.4 pg (ref 26.0–34.0)
MCHC: 33.8 g/dL (ref 32.0–36.0)
MCV: 86.8 fL (ref 81.0–101.0)
MONO#: 0.6 10*3/uL (ref 0.1–0.9)
MONO%: 6.6 % (ref 0.0–13.0)
NEUT#: 7.2 10*3/uL — ABNORMAL HIGH (ref 1.5–6.5)
NEUT%: 77.1 % — ABNORMAL HIGH (ref 39.6–76.8)
Platelets: 286 10*3/uL (ref 145–400)
RBC: 4.52 10*6/uL (ref 3.70–5.32)
RDW: 15.5 % — ABNORMAL HIGH (ref 11.3–14.5)
WBC: 9.3 10*3/uL (ref 3.9–10.0)
lymph#: 1.3 10*3/uL (ref 0.9–3.3)

## 2008-01-18 ENCOUNTER — Other Ambulatory Visit: Admission: RE | Admit: 2008-01-18 | Discharge: 2008-01-18 | Payer: Self-pay | Admitting: Obstetrics and Gynecology

## 2008-01-24 ENCOUNTER — Ambulatory Visit: Payer: Self-pay | Admitting: Psychiatry

## 2008-01-31 LAB — CBC WITH DIFFERENTIAL/PLATELET
BASO%: 0.9 % (ref 0.0–2.0)
Basophils Absolute: 0.1 10*3/uL (ref 0.0–0.1)
EOS%: 1.9 % (ref 0.0–7.0)
Eosinophils Absolute: 0.1 10*3/uL (ref 0.0–0.5)
HCT: 39.6 % (ref 34.8–46.6)
HGB: 13.3 g/dL (ref 11.6–15.9)
LYMPH%: 23.2 % (ref 14.0–48.0)
MCH: 29.4 pg (ref 26.0–34.0)
MCHC: 33.5 g/dL (ref 32.0–36.0)
MCV: 87.6 fL (ref 81.0–101.0)
MONO#: 0.7 10*3/uL (ref 0.1–0.9)
MONO%: 9.5 % (ref 0.0–13.0)
NEUT#: 4.7 10*3/uL (ref 1.5–6.5)
NEUT%: 64.5 % (ref 39.6–76.8)
Platelets: 95 10*3/uL — ABNORMAL LOW (ref 145–400)
RBC: 4.52 10*6/uL (ref 3.70–5.32)
RDW: 15.2 % — ABNORMAL HIGH (ref 11.3–14.5)
WBC: 7.2 10*3/uL (ref 3.9–10.0)
lymph#: 1.7 10*3/uL (ref 0.9–3.3)

## 2008-02-07 LAB — CBC WITH DIFFERENTIAL/PLATELET
BASO%: 1 % (ref 0.0–2.0)
Basophils Absolute: 0 10*3/uL (ref 0.0–0.1)
EOS%: 1.7 % (ref 0.0–7.0)
Eosinophils Absolute: 0.1 10*3/uL (ref 0.0–0.5)
HCT: 38.5 % (ref 34.8–46.6)
HGB: 12.9 g/dL (ref 11.6–15.9)
LYMPH%: 22.7 % (ref 14.0–48.0)
MCH: 29.5 pg (ref 26.0–34.0)
MCHC: 33.6 g/dL (ref 32.0–36.0)
MCV: 87.7 fL (ref 81.0–101.0)
MONO#: 0.5 10*3/uL (ref 0.1–0.9)
MONO%: 10.4 % (ref 0.0–13.0)
NEUT#: 3.1 10*3/uL (ref 1.5–6.5)
NEUT%: 64.2 % (ref 39.6–76.8)
Platelets: 81 10*3/uL — ABNORMAL LOW (ref 145–400)
RBC: 4.4 10*6/uL (ref 3.70–5.32)
RDW: 15.4 % — ABNORMAL HIGH (ref 11.3–14.5)
WBC: 4.8 10*3/uL (ref 3.9–10.0)
lymph#: 1.1 10*3/uL (ref 0.9–3.3)

## 2008-02-21 LAB — CBC WITH DIFFERENTIAL/PLATELET
BASO%: 0.6 % (ref 0.0–2.0)
Basophils Absolute: 0 10*3/uL (ref 0.0–0.1)
EOS%: 1.6 % (ref 0.0–7.0)
Eosinophils Absolute: 0.1 10*3/uL (ref 0.0–0.5)
HCT: 36.7 % (ref 34.8–46.6)
HGB: 12.5 g/dL (ref 11.6–15.9)
LYMPH%: 14.6 % (ref 14.0–48.0)
MCH: 29.8 pg (ref 26.0–34.0)
MCHC: 34.1 g/dL (ref 32.0–36.0)
MCV: 87.3 fL (ref 81.0–101.0)
MONO#: 0.7 10*3/uL (ref 0.1–0.9)
MONO%: 8.8 % (ref 0.0–13.0)
NEUT#: 6.3 10*3/uL (ref 1.5–6.5)
NEUT%: 74.4 % (ref 39.6–76.8)
Platelets: 60 10*3/uL — ABNORMAL LOW (ref 145–400)
RBC: 4.2 10*6/uL (ref 3.70–5.32)
RDW: 14.9 % — ABNORMAL HIGH (ref 11.3–14.5)
WBC: 8.5 10*3/uL (ref 3.9–10.0)
lymph#: 1.2 10*3/uL (ref 0.9–3.3)

## 2008-02-26 ENCOUNTER — Ambulatory Visit: Payer: Self-pay | Admitting: Oncology

## 2008-02-28 LAB — CBC WITH DIFFERENTIAL/PLATELET
BASO%: 0.6 % (ref 0.0–2.0)
Basophils Absolute: 0 10*3/uL (ref 0.0–0.1)
EOS%: 1.7 % (ref 0.0–7.0)
Eosinophils Absolute: 0.1 10*3/uL (ref 0.0–0.5)
HCT: 33.9 % — ABNORMAL LOW (ref 34.8–46.6)
HGB: 11.5 g/dL — ABNORMAL LOW (ref 11.6–15.9)
LYMPH%: 15.4 % (ref 14.0–49.7)
MCH: 29.6 pg (ref 25.1–34.0)
MCHC: 33.8 g/dL (ref 31.5–36.0)
MCV: 87.4 fL (ref 79.5–101.0)
MONO#: 0.6 10*3/uL (ref 0.1–0.9)
MONO%: 8.7 % (ref 0.0–14.0)
NEUT#: 5.1 10*3/uL (ref 1.5–6.5)
NEUT%: 73.6 % (ref 38.4–76.8)
Platelets: 74 10*3/uL — ABNORMAL LOW (ref 145–400)
RBC: 3.88 10*6/uL (ref 3.70–5.45)
RDW: 14.8 % — ABNORMAL HIGH (ref 11.2–14.5)
WBC: 7 10*3/uL (ref 3.9–10.3)
lymph#: 1.1 10*3/uL (ref 0.9–3.3)

## 2008-03-06 LAB — CBC WITH DIFFERENTIAL/PLATELET
BASO%: 0.7 % (ref 0.0–2.0)
Basophils Absolute: 0.1 10*3/uL (ref 0.0–0.1)
EOS%: 1.1 % (ref 0.0–7.0)
Eosinophils Absolute: 0.1 10*3/uL (ref 0.0–0.5)
HCT: 37.8 % (ref 34.8–46.6)
HGB: 12.7 g/dL (ref 11.6–15.9)
LYMPH%: 18 % (ref 14.0–49.7)
MCH: 29.3 pg (ref 25.1–34.0)
MCHC: 33.7 g/dL (ref 31.5–36.0)
MCV: 87.2 fL (ref 79.5–101.0)
MONO#: 0.6 10*3/uL (ref 0.1–0.9)
MONO%: 7 % (ref 0.0–14.0)
NEUT#: 6 10*3/uL (ref 1.5–6.5)
NEUT%: 73.2 % (ref 38.4–76.8)
Platelets: 140 10*3/uL — ABNORMAL LOW (ref 145–400)
RBC: 4.34 10*6/uL (ref 3.70–5.45)
RDW: 14.7 % — ABNORMAL HIGH (ref 11.2–14.5)
WBC: 8.3 10*3/uL (ref 3.9–10.3)
lymph#: 1.5 10*3/uL (ref 0.9–3.3)

## 2008-03-13 LAB — CBC WITH DIFFERENTIAL/PLATELET
BASO%: 0.8 % (ref 0.0–2.0)
Basophils Absolute: 0.1 10*3/uL (ref 0.0–0.1)
EOS%: 2.6 % (ref 0.0–7.0)
Eosinophils Absolute: 0.2 10*3/uL (ref 0.0–0.5)
HCT: 40.6 % (ref 34.8–46.6)
HGB: 13.3 g/dL (ref 11.6–15.9)
LYMPH%: 21.7 % (ref 14.0–49.7)
MCH: 28.1 pg (ref 25.1–34.0)
MCHC: 32.8 g/dL (ref 31.5–36.0)
MCV: 85.7 fL (ref 79.5–101.0)
MONO#: 0.6 10*3/uL (ref 0.1–0.9)
MONO%: 8.1 % (ref 0.0–14.0)
NEUT#: 4.9 10*3/uL (ref 1.5–6.5)
NEUT%: 66.8 % (ref 38.4–76.8)
Platelets: 47 10*3/uL — ABNORMAL LOW (ref 145–400)
RBC: 4.74 10*6/uL (ref 3.70–5.45)
RDW: 13.8 % (ref 11.2–14.5)
WBC: 7.3 10*3/uL (ref 3.9–10.3)
lymph#: 1.6 10*3/uL (ref 0.9–3.3)
nRBC: 0 % (ref 0–0)

## 2008-03-20 LAB — CBC WITH DIFFERENTIAL/PLATELET
BASO%: 1.1 % (ref 0.0–2.0)
Basophils Absolute: 0.1 10*3/uL (ref 0.0–0.1)
EOS%: 1.3 % (ref 0.0–7.0)
Eosinophils Absolute: 0.1 10*3/uL (ref 0.0–0.5)
HCT: 38.5 % (ref 34.8–46.6)
HGB: 13.1 g/dL (ref 11.6–15.9)
LYMPH%: 22 % (ref 14.0–49.7)
MCH: 29.4 pg (ref 25.1–34.0)
MCHC: 33.9 g/dL (ref 31.5–36.0)
MCV: 86.6 fL (ref 79.5–101.0)
MONO#: 0.6 10*3/uL (ref 0.1–0.9)
MONO%: 9.2 % (ref 0.0–14.0)
NEUT#: 4.2 10*3/uL (ref 1.5–6.5)
NEUT%: 66.4 % (ref 38.4–76.8)
Platelets: 41 10*3/uL — ABNORMAL LOW (ref 145–400)
RBC: 4.45 10*6/uL (ref 3.70–5.45)
RDW: 14.4 % (ref 11.2–14.5)
WBC: 6.4 10*3/uL (ref 3.9–10.3)
lymph#: 1.4 10*3/uL (ref 0.9–3.3)

## 2008-03-27 LAB — CBC WITH DIFFERENTIAL/PLATELET
BASO%: 0.6 % (ref 0.0–2.0)
Basophils Absolute: 0 10*3/uL (ref 0.0–0.1)
EOS%: 1.2 % (ref 0.0–7.0)
Eosinophils Absolute: 0.1 10*3/uL (ref 0.0–0.5)
HCT: 38.9 % (ref 34.8–46.6)
HGB: 13 g/dL (ref 11.6–15.9)
LYMPH%: 19.8 % (ref 14.0–49.7)
MCH: 29.1 pg (ref 25.1–34.0)
MCHC: 33.4 g/dL (ref 31.5–36.0)
MCV: 87.3 fL (ref 79.5–101.0)
MONO#: 0.6 10*3/uL (ref 0.1–0.9)
MONO%: 8.9 % (ref 0.0–14.0)
NEUT#: 5 10*3/uL (ref 1.5–6.5)
NEUT%: 69.5 % (ref 38.4–76.8)
Platelets: 168 10*3/uL (ref 145–400)
RBC: 4.46 10*6/uL (ref 3.70–5.45)
RDW: 14.7 % — ABNORMAL HIGH (ref 11.2–14.5)
WBC: 7.2 10*3/uL (ref 3.9–10.3)
lymph#: 1.4 10*3/uL (ref 0.9–3.3)

## 2008-04-03 LAB — CBC WITH DIFFERENTIAL/PLATELET
BASO%: 0.6 % (ref 0.0–2.0)
Basophils Absolute: 0.1 10*3/uL (ref 0.0–0.1)
EOS%: 1.3 % (ref 0.0–7.0)
Eosinophils Absolute: 0.1 10*3/uL (ref 0.0–0.5)
HCT: 38.2 % (ref 34.8–46.6)
HGB: 12.6 g/dL (ref 11.6–15.9)
LYMPH%: 17.9 % (ref 14.0–49.7)
MCH: 29.3 pg (ref 25.1–34.0)
MCHC: 33.1 g/dL (ref 31.5–36.0)
MCV: 88.6 fL (ref 79.5–101.0)
MONO#: 0.6 10*3/uL (ref 0.1–0.9)
MONO%: 7.7 % (ref 0.0–14.0)
NEUT#: 5.8 10*3/uL (ref 1.5–6.5)
NEUT%: 72.5 % (ref 38.4–76.8)
Platelets: 167 10*3/uL (ref 145–400)
RBC: 4.31 10*6/uL (ref 3.70–5.45)
RDW: 14.7 % — ABNORMAL HIGH (ref 11.2–14.5)
WBC: 8 10*3/uL (ref 3.9–10.3)
lymph#: 1.4 10*3/uL (ref 0.9–3.3)

## 2008-04-10 LAB — CBC WITH DIFFERENTIAL/PLATELET
BASO%: 0.8 % (ref 0.0–2.0)
Basophils Absolute: 0.1 10*3/uL (ref 0.0–0.1)
EOS%: 1.4 % (ref 0.0–7.0)
Eosinophils Absolute: 0.1 10*3/uL (ref 0.0–0.5)
HCT: 39 % (ref 34.8–46.6)
HGB: 13.1 g/dL (ref 11.6–15.9)
LYMPH%: 21.6 % (ref 14.0–49.7)
MCH: 29.6 pg (ref 25.1–34.0)
MCHC: 33.6 g/dL (ref 31.5–36.0)
MCV: 88.2 fL (ref 79.5–101.0)
MONO#: 0.6 10*3/uL (ref 0.1–0.9)
MONO%: 9.1 % (ref 0.0–14.0)
NEUT#: 4.3 10*3/uL (ref 1.5–6.5)
NEUT%: 67.1 % (ref 38.4–76.8)
Platelets: 77 10*3/uL — ABNORMAL LOW (ref 145–400)
RBC: 4.43 10*6/uL (ref 3.70–5.45)
RDW: 14.6 % — ABNORMAL HIGH (ref 11.2–14.5)
WBC: 6.4 10*3/uL (ref 3.9–10.3)
lymph#: 1.4 10*3/uL (ref 0.9–3.3)

## 2008-04-15 ENCOUNTER — Ambulatory Visit: Payer: Self-pay | Admitting: Oncology

## 2008-04-17 LAB — CBC WITH DIFFERENTIAL/PLATELET
BASO%: 0.9 % (ref 0.0–2.0)
Basophils Absolute: 0.1 10*3/uL (ref 0.0–0.1)
EOS%: 1.9 % (ref 0.0–7.0)
Eosinophils Absolute: 0.1 10*3/uL (ref 0.0–0.5)
HCT: 40 % (ref 34.8–46.6)
HGB: 13.3 g/dL (ref 11.6–15.9)
LYMPH%: 22.9 % (ref 14.0–49.7)
MCH: 29.4 pg (ref 25.1–34.0)
MCHC: 33.2 g/dL (ref 31.5–36.0)
MCV: 88.5 fL (ref 79.5–101.0)
MONO#: 0.6 10*3/uL (ref 0.1–0.9)
MONO%: 9 % (ref 0.0–14.0)
NEUT#: 4.4 10*3/uL (ref 1.5–6.5)
NEUT%: 65.3 % (ref 38.4–76.8)
Platelets: 68 10*3/uL — ABNORMAL LOW (ref 145–400)
RBC: 4.52 10*6/uL (ref 3.70–5.45)
RDW: 14.9 % — ABNORMAL HIGH (ref 11.2–14.5)
WBC: 6.8 10*3/uL (ref 3.9–10.3)
lymph#: 1.6 10*3/uL (ref 0.9–3.3)

## 2008-04-24 LAB — CBC WITH DIFFERENTIAL/PLATELET
BASO%: 0.8 % (ref 0.0–2.0)
Basophils Absolute: 0.1 10*3/uL (ref 0.0–0.1)
EOS%: 1.4 % (ref 0.0–7.0)
Eosinophils Absolute: 0.1 10*3/uL (ref 0.0–0.5)
HCT: 37 % (ref 34.8–46.6)
HGB: 12.4 g/dL (ref 11.6–15.9)
LYMPH%: 20 % (ref 14.0–49.7)
MCH: 29.4 pg (ref 25.1–34.0)
MCHC: 33.4 g/dL (ref 31.5–36.0)
MCV: 88.2 fL (ref 79.5–101.0)
MONO#: 0.6 10*3/uL (ref 0.1–0.9)
MONO%: 8.7 % (ref 0.0–14.0)
NEUT#: 4.5 10*3/uL (ref 1.5–6.5)
NEUT%: 69.1 % (ref 38.4–76.8)
Platelets: 77 10*3/uL — ABNORMAL LOW (ref 145–400)
RBC: 4.2 10*6/uL (ref 3.70–5.45)
RDW: 14.6 % — ABNORMAL HIGH (ref 11.2–14.5)
WBC: 6.5 10*3/uL (ref 3.9–10.3)
lymph#: 1.3 10*3/uL (ref 0.9–3.3)

## 2008-05-01 LAB — CBC WITH DIFFERENTIAL/PLATELET
BASO%: 1 % (ref 0.0–2.0)
Basophils Absolute: 0.1 10*3/uL (ref 0.0–0.1)
EOS%: 1.7 % (ref 0.0–7.0)
Eosinophils Absolute: 0.1 10*3/uL (ref 0.0–0.5)
HCT: 37.1 % (ref 34.8–46.6)
HGB: 12.6 g/dL (ref 11.6–15.9)
LYMPH%: 21.6 % (ref 14.0–49.7)
MCH: 29.7 pg (ref 25.1–34.0)
MCHC: 34 g/dL (ref 31.5–36.0)
MCV: 87.2 fL (ref 79.5–101.0)
MONO#: 0.5 10*3/uL (ref 0.1–0.9)
MONO%: 8.6 % (ref 0.0–14.0)
NEUT#: 4.1 10*3/uL (ref 1.5–6.5)
NEUT%: 67.1 % (ref 38.4–76.8)
Platelets: 120 10*3/uL — ABNORMAL LOW (ref 145–400)
RBC: 4.25 10*6/uL (ref 3.70–5.45)
RDW: 14.8 % — ABNORMAL HIGH (ref 11.2–14.5)
WBC: 6.2 10*3/uL (ref 3.9–10.3)
lymph#: 1.3 10*3/uL (ref 0.9–3.3)

## 2008-05-15 LAB — CBC WITH DIFFERENTIAL/PLATELET
BASO%: 0.4 % (ref 0.0–2.0)
Basophils Absolute: 0 10*3/uL (ref 0.0–0.1)
EOS%: 0.9 % (ref 0.0–7.0)
Eosinophils Absolute: 0.1 10*3/uL (ref 0.0–0.5)
HCT: 39.5 % (ref 34.8–46.6)
HGB: 13.1 g/dL (ref 11.6–15.9)
LYMPH%: 19.1 % (ref 14.0–49.7)
MCH: 29.2 pg (ref 25.1–34.0)
MCHC: 33.2 g/dL (ref 31.5–36.0)
MCV: 87.9 fL (ref 79.5–101.0)
MONO#: 0.7 10*3/uL (ref 0.1–0.9)
MONO%: 9.9 % (ref 0.0–14.0)
NEUT#: 4.9 10*3/uL (ref 1.5–6.5)
NEUT%: 69.7 % (ref 38.4–76.8)
Platelets: 273 10*3/uL (ref 145–400)
RBC: 4.49 10*6/uL (ref 3.70–5.45)
RDW: 14.7 % — ABNORMAL HIGH (ref 11.2–14.5)
WBC: 7 10*3/uL (ref 3.9–10.3)
lymph#: 1.3 10*3/uL (ref 0.9–3.3)

## 2008-05-22 LAB — CBC WITH DIFFERENTIAL/PLATELET
BASO%: 0.7 % (ref 0.0–2.0)
Basophils Absolute: 0.1 10*3/uL (ref 0.0–0.1)
EOS%: 2.1 % (ref 0.0–7.0)
Eosinophils Absolute: 0.2 10*3/uL (ref 0.0–0.5)
HCT: 38 % (ref 34.8–46.6)
HGB: 12.6 g/dL (ref 11.6–15.9)
LYMPH%: 14.3 % (ref 14.0–49.7)
MCH: 29.5 pg (ref 25.1–34.0)
MCHC: 33.3 g/dL (ref 31.5–36.0)
MCV: 88.3 fL (ref 79.5–101.0)
MONO#: 0.7 10*3/uL (ref 0.1–0.9)
MONO%: 8.3 % (ref 0.0–14.0)
NEUT#: 6.2 10*3/uL (ref 1.5–6.5)
NEUT%: 74.6 % (ref 38.4–76.8)
Platelets: 152 10*3/uL (ref 145–400)
RBC: 4.3 10*6/uL (ref 3.70–5.45)
RDW: 14.9 % — ABNORMAL HIGH (ref 11.2–14.5)
WBC: 8.3 10*3/uL (ref 3.9–10.3)
lymph#: 1.2 10*3/uL (ref 0.9–3.3)

## 2008-05-29 LAB — CBC WITH DIFFERENTIAL/PLATELET
BASO%: 1 % (ref 0.0–2.0)
Basophils Absolute: 0.1 10*3/uL (ref 0.0–0.1)
EOS%: 2.1 % (ref 0.0–7.0)
Eosinophils Absolute: 0.2 10*3/uL (ref 0.0–0.5)
HCT: 39.7 % (ref 34.8–46.6)
HGB: 13.2 g/dL (ref 11.6–15.9)
LYMPH%: 23.8 % (ref 14.0–49.7)
MCH: 28.7 pg (ref 25.1–34.0)
MCHC: 33.2 g/dL (ref 31.5–36.0)
MCV: 86.3 fL (ref 79.5–101.0)
MONO#: 0.6 10*3/uL (ref 0.1–0.9)
MONO%: 8.1 % (ref 0.0–14.0)
NEUT#: 4.7 10*3/uL (ref 1.5–6.5)
NEUT%: 65 % (ref 38.4–76.8)
Platelets: 251 10*3/uL (ref 145–400)
RBC: 4.6 10*6/uL (ref 3.70–5.45)
RDW: 14.3 % (ref 11.2–14.5)
WBC: 7.3 10*3/uL (ref 3.9–10.3)
lymph#: 1.7 10*3/uL (ref 0.9–3.3)

## 2008-05-31 ENCOUNTER — Ambulatory Visit: Payer: Self-pay | Admitting: Oncology

## 2008-06-05 LAB — CBC WITH DIFFERENTIAL/PLATELET
BASO%: 1.2 % (ref 0.0–2.0)
Basophils Absolute: 0.1 10*3/uL (ref 0.0–0.1)
EOS%: 2.6 % (ref 0.0–7.0)
Eosinophils Absolute: 0.2 10*3/uL (ref 0.0–0.5)
HCT: 36.2 % (ref 34.8–46.6)
HGB: 12.2 g/dL (ref 11.6–15.9)
LYMPH%: 18.3 % (ref 14.0–49.7)
MCH: 29.7 pg (ref 25.1–34.0)
MCHC: 33.8 g/dL (ref 31.5–36.0)
MCV: 87.9 fL (ref 79.5–101.0)
MONO#: 0.7 10*3/uL (ref 0.1–0.9)
MONO%: 9.6 % (ref 0.0–14.0)
NEUT#: 5.1 10*3/uL (ref 1.5–6.5)
NEUT%: 68.3 % (ref 38.4–76.8)
Platelets: 241 10*3/uL (ref 145–400)
RBC: 4.12 10*6/uL (ref 3.70–5.45)
RDW: 14.8 % — ABNORMAL HIGH (ref 11.2–14.5)
WBC: 7.5 10*3/uL (ref 3.9–10.3)
lymph#: 1.4 10*3/uL (ref 0.9–3.3)

## 2008-06-12 LAB — CBC WITH DIFFERENTIAL/PLATELET
BASO%: 0.4 % (ref 0.0–2.0)
Basophils Absolute: 0 10*3/uL (ref 0.0–0.1)
EOS%: 0.7 % (ref 0.0–7.0)
Eosinophils Absolute: 0.1 10*3/uL (ref 0.0–0.5)
HCT: 37.9 % (ref 34.8–46.6)
HGB: 13.1 g/dL (ref 11.6–15.9)
LYMPH%: 15.6 % (ref 14.0–49.7)
MCH: 30.2 pg (ref 25.1–34.0)
MCHC: 34.6 g/dL (ref 31.5–36.0)
MCV: 87.2 fL (ref 79.5–101.0)
MONO#: 0.8 10*3/uL (ref 0.1–0.9)
MONO%: 8.7 % (ref 0.0–14.0)
NEUT#: 6.9 10*3/uL — ABNORMAL HIGH (ref 1.5–6.5)
NEUT%: 74.6 % (ref 38.4–76.8)
Platelets: 115 10*3/uL — ABNORMAL LOW (ref 145–400)
RBC: 4.35 10*6/uL (ref 3.70–5.45)
RDW: 14.5 % (ref 11.2–14.5)
WBC: 9.2 10*3/uL (ref 3.9–10.3)
lymph#: 1.4 10*3/uL (ref 0.9–3.3)

## 2008-06-19 LAB — CBC WITH DIFFERENTIAL/PLATELET
BASO%: 0.4 % (ref 0.0–2.0)
Basophils Absolute: 0 10*3/uL (ref 0.0–0.1)
EOS%: 0.9 % (ref 0.0–7.0)
Eosinophils Absolute: 0.1 10*3/uL (ref 0.0–0.5)
HCT: 38 % (ref 34.8–46.6)
HGB: 13 g/dL (ref 11.6–15.9)
LYMPH%: 16.1 % (ref 14.0–49.7)
MCH: 30.1 pg (ref 25.1–34.0)
MCHC: 34.3 g/dL (ref 31.5–36.0)
MCV: 87.7 fL (ref 79.5–101.0)
MONO#: 0.6 10*3/uL (ref 0.1–0.9)
MONO%: 7.4 % (ref 0.0–14.0)
NEUT#: 6.4 10*3/uL (ref 1.5–6.5)
NEUT%: 75.2 % (ref 38.4–76.8)
Platelets: 147 10*3/uL (ref 145–400)
RBC: 4.33 10*6/uL (ref 3.70–5.45)
RDW: 14.3 % (ref 11.2–14.5)
WBC: 8.6 10*3/uL (ref 3.9–10.3)
lymph#: 1.4 10*3/uL (ref 0.9–3.3)

## 2008-06-22 ENCOUNTER — Emergency Department (HOSPITAL_COMMUNITY): Admission: EM | Admit: 2008-06-22 | Discharge: 2008-06-22 | Payer: Self-pay | Admitting: Emergency Medicine

## 2008-06-26 LAB — CBC WITH DIFFERENTIAL/PLATELET
BASO%: 0.9 % (ref 0.0–2.0)
Basophils Absolute: 0 10*3/uL (ref 0.0–0.1)
EOS%: 1.5 % (ref 0.0–7.0)
Eosinophils Absolute: 0.1 10*3/uL (ref 0.0–0.5)
HCT: 38.4 % (ref 34.8–46.6)
HGB: 13.2 g/dL (ref 11.6–15.9)
LYMPH%: 26 % (ref 14.0–49.7)
MCH: 29.9 pg (ref 25.1–34.0)
MCHC: 34.5 g/dL (ref 31.5–36.0)
MCV: 86.8 fL (ref 79.5–101.0)
MONO#: 0.5 10*3/uL (ref 0.1–0.9)
MONO%: 9.7 % (ref 0.0–14.0)
NEUT#: 3.4 10*3/uL (ref 1.5–6.5)
NEUT%: 61.9 % (ref 38.4–76.8)
Platelets: 80 10*3/uL — ABNORMAL LOW (ref 145–400)
RBC: 4.42 10*6/uL (ref 3.70–5.45)
RDW: 14.3 % (ref 11.2–14.5)
WBC: 5.5 10*3/uL (ref 3.9–10.3)
lymph#: 1.4 10*3/uL (ref 0.9–3.3)

## 2008-07-03 ENCOUNTER — Ambulatory Visit: Payer: Self-pay | Admitting: Oncology

## 2008-07-03 LAB — CBC WITH DIFFERENTIAL/PLATELET
BASO%: 1 % (ref 0.0–2.0)
Basophils Absolute: 0.1 10*3/uL (ref 0.0–0.1)
EOS%: 1.6 % (ref 0.0–7.0)
Eosinophils Absolute: 0.1 10*3/uL (ref 0.0–0.5)
HCT: 36.8 % (ref 34.8–46.6)
HGB: 12.5 g/dL (ref 11.6–15.9)
LYMPH%: 20.2 % (ref 14.0–49.7)
MCH: 29.5 pg (ref 25.1–34.0)
MCHC: 33.9 g/dL (ref 31.5–36.0)
MCV: 87.1 fL (ref 79.5–101.0)
MONO#: 0.6 10*3/uL (ref 0.1–0.9)
MONO%: 7.8 % (ref 0.0–14.0)
NEUT#: 5.6 10*3/uL (ref 1.5–6.5)
NEUT%: 69.4 % (ref 38.4–76.8)
Platelets: 110 10*3/uL — ABNORMAL LOW (ref 145–400)
RBC: 4.23 10*6/uL (ref 3.70–5.45)
RDW: 14.8 % — ABNORMAL HIGH (ref 11.2–14.5)
WBC: 8 10*3/uL (ref 3.9–10.3)
lymph#: 1.6 10*3/uL (ref 0.9–3.3)

## 2008-07-10 LAB — CBC WITH DIFFERENTIAL/PLATELET
BASO%: 1.4 % (ref 0.0–2.0)
Basophils Absolute: 0.1 10*3/uL (ref 0.0–0.1)
EOS%: 1.7 % (ref 0.0–7.0)
Eosinophils Absolute: 0.1 10*3/uL (ref 0.0–0.5)
HCT: 36.5 % (ref 34.8–46.6)
HGB: 12.3 g/dL (ref 11.6–15.9)
LYMPH%: 24.9 % (ref 14.0–49.7)
MCH: 29.7 pg (ref 25.1–34.0)
MCHC: 33.8 g/dL (ref 31.5–36.0)
MCV: 87.8 fL (ref 79.5–101.0)
MONO#: 0.6 10*3/uL (ref 0.1–0.9)
MONO%: 8.5 % (ref 0.0–14.0)
NEUT#: 4.2 10*3/uL (ref 1.5–6.5)
NEUT%: 63.5 % (ref 38.4–76.8)
Platelets: 67 10*3/uL — ABNORMAL LOW (ref 145–400)
RBC: 4.16 10*6/uL (ref 3.70–5.45)
RDW: 15.1 % — ABNORMAL HIGH (ref 11.2–14.5)
WBC: 6.6 10*3/uL (ref 3.9–10.3)
lymph#: 1.6 10*3/uL (ref 0.9–3.3)

## 2008-07-17 LAB — CBC WITH DIFFERENTIAL/PLATELET
BASO%: 1 % (ref 0.0–2.0)
Basophils Absolute: 0.1 10*3/uL (ref 0.0–0.1)
EOS%: 1.5 % (ref 0.0–7.0)
Eosinophils Absolute: 0.1 10*3/uL (ref 0.0–0.5)
HCT: 37.7 % (ref 34.8–46.6)
HGB: 12.7 g/dL (ref 11.6–15.9)
LYMPH%: 26.9 % (ref 14.0–49.7)
MCH: 29.3 pg (ref 25.1–34.0)
MCHC: 33.8 g/dL (ref 31.5–36.0)
MCV: 86.7 fL (ref 79.5–101.0)
MONO#: 0.6 10*3/uL (ref 0.1–0.9)
MONO%: 8 % (ref 0.0–14.0)
NEUT#: 4.7 10*3/uL (ref 1.5–6.5)
NEUT%: 62.6 % (ref 38.4–76.8)
Platelets: 83 10*3/uL — ABNORMAL LOW (ref 145–400)
RBC: 4.35 10*6/uL (ref 3.70–5.45)
RDW: 14.9 % — ABNORMAL HIGH (ref 11.2–14.5)
WBC: 7.4 10*3/uL (ref 3.9–10.3)
lymph#: 2 10*3/uL (ref 0.9–3.3)

## 2008-07-24 LAB — CBC WITH DIFFERENTIAL/PLATELET
BASO%: 0.7 % (ref 0.0–2.0)
Basophils Absolute: 0.1 10*3/uL (ref 0.0–0.1)
EOS%: 2.2 % (ref 0.0–7.0)
Eosinophils Absolute: 0.2 10*3/uL (ref 0.0–0.5)
HCT: 36.1 % (ref 34.8–46.6)
HGB: 12.1 g/dL (ref 11.6–15.9)
LYMPH%: 17.8 % (ref 14.0–49.7)
MCH: 29.5 pg (ref 25.1–34.0)
MCHC: 33.7 g/dL (ref 31.5–36.0)
MCV: 87.7 fL (ref 79.5–101.0)
MONO#: 0.6 10*3/uL (ref 0.1–0.9)
MONO%: 7.8 % (ref 0.0–14.0)
NEUT#: 5.9 10*3/uL (ref 1.5–6.5)
NEUT%: 71.5 % (ref 38.4–76.8)
Platelets: 131 10*3/uL — ABNORMAL LOW (ref 145–400)
RBC: 4.12 10*6/uL (ref 3.70–5.45)
RDW: 15.4 % — ABNORMAL HIGH (ref 11.2–14.5)
WBC: 8.2 10*3/uL (ref 3.9–10.3)
lymph#: 1.5 10*3/uL (ref 0.9–3.3)

## 2008-07-26 ENCOUNTER — Ambulatory Visit: Payer: Self-pay | Admitting: Oncology

## 2008-07-31 LAB — CBC WITH DIFFERENTIAL/PLATELET
BASO%: 1 % (ref 0.0–2.0)
Basophils Absolute: 0.1 10e3/uL (ref 0.0–0.1)
EOS%: 2 % (ref 0.0–7.0)
Eosinophils Absolute: 0.1 10e3/uL (ref 0.0–0.5)
HCT: 36.9 % (ref 34.8–46.6)
HGB: 12.4 g/dL (ref 11.6–15.9)
LYMPH%: 20.1 % (ref 14.0–49.7)
MCH: 29.5 pg (ref 25.1–34.0)
MCHC: 33.7 g/dL (ref 31.5–36.0)
MCV: 87.6 fL (ref 79.5–101.0)
MONO#: 0.6 10e3/uL (ref 0.1–0.9)
MONO%: 8.9 % (ref 0.0–14.0)
NEUT#: 4.5 10e3/uL (ref 1.5–6.5)
NEUT%: 68 % (ref 38.4–76.8)
Platelets: 118 10e3/uL — ABNORMAL LOW (ref 145–400)
RBC: 4.22 10e6/uL (ref 3.70–5.45)
RDW: 15.5 % — ABNORMAL HIGH (ref 11.2–14.5)
WBC: 6.7 10e3/uL (ref 3.9–10.3)
lymph#: 1.3 10e3/uL (ref 0.9–3.3)

## 2008-08-08 LAB — CBC WITH DIFFERENTIAL/PLATELET
BASO%: 0.3 % (ref 0.0–2.0)
Basophils Absolute: 0 10*3/uL (ref 0.0–0.1)
EOS%: 1.6 % (ref 0.0–7.0)
Eosinophils Absolute: 0.1 10*3/uL (ref 0.0–0.5)
HCT: 40 % (ref 34.8–46.6)
HGB: 13.5 g/dL (ref 11.6–15.9)
LYMPH%: 19.4 % (ref 14.0–49.7)
MCH: 29.6 pg (ref 25.1–34.0)
MCHC: 33.7 g/dL (ref 31.5–36.0)
MCV: 87.9 fL (ref 79.5–101.0)
MONO#: 0.5 10*3/uL (ref 0.1–0.9)
MONO%: 7.4 % (ref 0.0–14.0)
NEUT#: 4.4 10*3/uL (ref 1.5–6.5)
NEUT%: 71.3 % (ref 38.4–76.8)
Platelets: 97 10*3/uL — ABNORMAL LOW (ref 145–400)
RBC: 4.55 10*6/uL (ref 3.70–5.45)
RDW: 15.4 % — ABNORMAL HIGH (ref 11.2–14.5)
WBC: 6.2 10*3/uL (ref 3.9–10.3)
lymph#: 1.2 10*3/uL (ref 0.9–3.3)

## 2008-08-14 LAB — CBC WITH DIFFERENTIAL/PLATELET
BASO%: 0.9 % (ref 0.0–2.0)
Basophils Absolute: 0.1 10*3/uL (ref 0.0–0.1)
EOS%: 1.4 % (ref 0.0–7.0)
Eosinophils Absolute: 0.1 10*3/uL (ref 0.0–0.5)
HCT: 37.9 % (ref 34.8–46.6)
HGB: 12.6 g/dL (ref 11.6–15.9)
LYMPH%: 19.1 % (ref 14.0–49.7)
MCH: 29.5 pg (ref 25.1–34.0)
MCHC: 33.4 g/dL (ref 31.5–36.0)
MCV: 88.3 fL (ref 79.5–101.0)
MONO#: 0.6 10*3/uL (ref 0.1–0.9)
MONO%: 8.5 % (ref 0.0–14.0)
NEUT#: 4.8 10*3/uL (ref 1.5–6.5)
NEUT%: 70.1 % (ref 38.4–76.8)
Platelets: 167 10*3/uL (ref 145–400)
RBC: 4.29 10*6/uL (ref 3.70–5.45)
RDW: 15.6 % — ABNORMAL HIGH (ref 11.2–14.5)
WBC: 6.8 10*3/uL (ref 3.9–10.3)
lymph#: 1.3 10*3/uL (ref 0.9–3.3)

## 2008-08-21 LAB — CBC WITH DIFFERENTIAL/PLATELET
BASO%: 1.4 % (ref 0.0–2.0)
Basophils Absolute: 0.1 10*3/uL (ref 0.0–0.1)
EOS%: 2.6 % (ref 0.0–7.0)
Eosinophils Absolute: 0.1 10*3/uL (ref 0.0–0.5)
HCT: 37.5 % (ref 34.8–46.6)
HGB: 12.8 g/dL (ref 11.6–15.9)
LYMPH%: 30.9 % (ref 14.0–49.7)
MCH: 29.9 pg (ref 25.1–34.0)
MCHC: 34 g/dL (ref 31.5–36.0)
MCV: 88.1 fL (ref 79.5–101.0)
MONO#: 0.6 10*3/uL (ref 0.1–0.9)
MONO%: 12 % (ref 0.0–14.0)
NEUT#: 2.6 10*3/uL (ref 1.5–6.5)
NEUT%: 53.1 % (ref 38.4–76.8)
Platelets: 55 10*3/uL — ABNORMAL LOW (ref 145–400)
RBC: 4.26 10*6/uL (ref 3.70–5.45)
RDW: 15.7 % — ABNORMAL HIGH (ref 11.2–14.5)
WBC: 5 10*3/uL (ref 3.9–10.3)
lymph#: 1.5 10*3/uL (ref 0.9–3.3)

## 2008-08-26 ENCOUNTER — Ambulatory Visit: Payer: Self-pay | Admitting: Oncology

## 2008-08-28 LAB — CBC WITH DIFFERENTIAL/PLATELET
BASO%: 0.7 % (ref 0.0–2.0)
Basophils Absolute: 0 10*3/uL (ref 0.0–0.1)
EOS%: 1.4 % (ref 0.0–7.0)
Eosinophils Absolute: 0.1 10*3/uL (ref 0.0–0.5)
HCT: 36 % (ref 34.8–46.6)
HGB: 12.1 g/dL (ref 11.6–15.9)
LYMPH%: 23.9 % (ref 14.0–49.7)
MCH: 29.7 pg (ref 25.1–34.0)
MCHC: 33.6 g/dL (ref 31.5–36.0)
MCV: 88.3 fL (ref 79.5–101.0)
MONO#: 0.5 10*3/uL (ref 0.1–0.9)
MONO%: 8.3 % (ref 0.0–14.0)
NEUT#: 4.2 10*3/uL (ref 1.5–6.5)
NEUT%: 65.7 % (ref 38.4–76.8)
Platelets: 95 10*3/uL — ABNORMAL LOW (ref 145–400)
RBC: 4.08 10*6/uL (ref 3.70–5.45)
RDW: 15.2 % — ABNORMAL HIGH (ref 11.2–14.5)
WBC: 6.3 10*3/uL (ref 3.9–10.3)
lymph#: 1.5 10*3/uL (ref 0.9–3.3)

## 2008-09-04 LAB — CBC WITH DIFFERENTIAL/PLATELET
BASO%: 0.5 % (ref 0.0–2.0)
Basophils Absolute: 0 10*3/uL (ref 0.0–0.1)
EOS%: 0.9 % (ref 0.0–7.0)
Eosinophils Absolute: 0.1 10*3/uL (ref 0.0–0.5)
HCT: 38.2 % (ref 34.8–46.6)
HGB: 12.7 g/dL (ref 11.6–15.9)
LYMPH%: 18.5 % (ref 14.0–49.7)
MCH: 29.4 pg (ref 25.1–34.0)
MCHC: 33.2 g/dL (ref 31.5–36.0)
MCV: 88.5 fL (ref 79.5–101.0)
MONO#: 0.7 10*3/uL (ref 0.1–0.9)
MONO%: 8 % (ref 0.0–14.0)
NEUT#: 5.9 10*3/uL (ref 1.5–6.5)
NEUT%: 72.1 % (ref 38.4–76.8)
Platelets: 116 10*3/uL — ABNORMAL LOW (ref 145–400)
RBC: 4.32 10*6/uL (ref 3.70–5.45)
RDW: 15.3 % — ABNORMAL HIGH (ref 11.2–14.5)
WBC: 8.2 10*3/uL (ref 3.9–10.3)
lymph#: 1.5 10*3/uL (ref 0.9–3.3)

## 2008-09-11 LAB — CBC WITH DIFFERENTIAL/PLATELET
BASO%: 0.8 % (ref 0.0–2.0)
Basophils Absolute: 0.1 10*3/uL (ref 0.0–0.1)
EOS%: 1.5 % (ref 0.0–7.0)
Eosinophils Absolute: 0.1 10*3/uL (ref 0.0–0.5)
HCT: 37.2 % (ref 34.8–46.6)
HGB: 12.5 g/dL (ref 11.6–15.9)
LYMPH%: 19.1 % (ref 14.0–49.7)
MCH: 29.9 pg (ref 25.1–34.0)
MCHC: 33.7 g/dL (ref 31.5–36.0)
MCV: 88.6 fL (ref 79.5–101.0)
MONO#: 0.6 10*3/uL (ref 0.1–0.9)
MONO%: 6.6 % (ref 0.0–14.0)
NEUT#: 6.4 10*3/uL (ref 1.5–6.5)
NEUT%: 72 % (ref 38.4–76.8)
Platelets: 139 10*3/uL — ABNORMAL LOW (ref 145–400)
RBC: 4.2 10*6/uL (ref 3.70–5.45)
RDW: 15.4 % — ABNORMAL HIGH (ref 11.2–14.5)
WBC: 8.9 10*3/uL (ref 3.9–10.3)
lymph#: 1.7 10*3/uL (ref 0.9–3.3)

## 2008-09-18 LAB — CBC WITH DIFFERENTIAL/PLATELET
BASO%: 0.8 % (ref 0.0–2.0)
Basophils Absolute: 0.1 10*3/uL (ref 0.0–0.1)
EOS%: 1.2 % (ref 0.0–7.0)
Eosinophils Absolute: 0.1 10*3/uL (ref 0.0–0.5)
HCT: 37.3 % (ref 34.8–46.6)
HGB: 12.7 g/dL (ref 11.6–15.9)
LYMPH%: 18.7 % (ref 14.0–49.7)
MCH: 30.2 pg (ref 25.1–34.0)
MCHC: 33.9 g/dL (ref 31.5–36.0)
MCV: 89.1 fL (ref 79.5–101.0)
MONO#: 0.5 10*3/uL (ref 0.1–0.9)
MONO%: 6.7 % (ref 0.0–14.0)
NEUT#: 5.7 10*3/uL (ref 1.5–6.5)
NEUT%: 72.6 % (ref 38.4–76.8)
Platelets: 87 10*3/uL — ABNORMAL LOW (ref 145–400)
RBC: 4.19 10*6/uL (ref 3.70–5.45)
RDW: 15.2 % — ABNORMAL HIGH (ref 11.2–14.5)
WBC: 7.8 10*3/uL (ref 3.9–10.3)
lymph#: 1.5 10*3/uL (ref 0.9–3.3)

## 2008-09-25 ENCOUNTER — Ambulatory Visit: Payer: Self-pay | Admitting: Oncology

## 2008-09-25 LAB — CBC WITH DIFFERENTIAL/PLATELET
BASO%: 0.6 % (ref 0.0–2.0)
Basophils Absolute: 0 10*3/uL (ref 0.0–0.1)
EOS%: 1.4 % (ref 0.0–7.0)
Eosinophils Absolute: 0.1 10*3/uL (ref 0.0–0.5)
HCT: 36.4 % (ref 34.8–46.6)
HGB: 12.2 g/dL (ref 11.6–15.9)
LYMPH%: 22.2 % (ref 14.0–49.7)
MCH: 29.6 pg (ref 25.1–34.0)
MCHC: 33.6 g/dL (ref 31.5–36.0)
MCV: 88.2 fL (ref 79.5–101.0)
MONO#: 0.7 10*3/uL (ref 0.1–0.9)
MONO%: 9.5 % (ref 0.0–14.0)
NEUT#: 4.6 10*3/uL (ref 1.5–6.5)
NEUT%: 66.3 % (ref 38.4–76.8)
Platelets: 68 10*3/uL — ABNORMAL LOW (ref 145–400)
RBC: 4.13 10*6/uL (ref 3.70–5.45)
RDW: 15 % — ABNORMAL HIGH (ref 11.2–14.5)
WBC: 6.9 10*3/uL (ref 3.9–10.3)
lymph#: 1.5 10*3/uL (ref 0.9–3.3)

## 2008-10-02 LAB — CBC WITH DIFFERENTIAL/PLATELET
BASO%: 1 % (ref 0.0–2.0)
Basophils Absolute: 0.1 10*3/uL (ref 0.0–0.1)
EOS%: 1.7 % (ref 0.0–7.0)
Eosinophils Absolute: 0.1 10*3/uL (ref 0.0–0.5)
HCT: 37.2 % (ref 34.8–46.6)
HGB: 12.5 g/dL (ref 11.6–15.9)
LYMPH%: 21.7 % (ref 14.0–49.7)
MCH: 29.6 pg (ref 25.1–34.0)
MCHC: 33.5 g/dL (ref 31.5–36.0)
MCV: 88.1 fL (ref 79.5–101.0)
MONO#: 0.7 10*3/uL (ref 0.1–0.9)
MONO%: 9.2 % (ref 0.0–14.0)
NEUT#: 4.8 10*3/uL (ref 1.5–6.5)
NEUT%: 66.4 % (ref 38.4–76.8)
Platelets: 130 10*3/uL — ABNORMAL LOW (ref 145–400)
RBC: 4.23 10*6/uL (ref 3.70–5.45)
RDW: 14.8 % — ABNORMAL HIGH (ref 11.2–14.5)
WBC: 7.2 10*3/uL (ref 3.9–10.3)
lymph#: 1.6 10*3/uL (ref 0.9–3.3)

## 2008-10-09 LAB — CBC WITH DIFFERENTIAL/PLATELET
BASO%: 0.5 % (ref 0.0–2.0)
Basophils Absolute: 0 10*3/uL (ref 0.0–0.1)
EOS%: 2.4 % (ref 0.0–7.0)
Eosinophils Absolute: 0.2 10*3/uL (ref 0.0–0.5)
HCT: 37.7 % (ref 34.8–46.6)
HGB: 12.5 g/dL (ref 11.6–15.9)
LYMPH%: 18.5 % (ref 14.0–49.7)
MCH: 29.6 pg (ref 25.1–34.0)
MCHC: 33.1 g/dL (ref 31.5–36.0)
MCV: 89.5 fL (ref 79.5–101.0)
MONO#: 0.6 10*3/uL (ref 0.1–0.9)
MONO%: 7.8 % (ref 0.0–14.0)
NEUT#: 5 10*3/uL (ref 1.5–6.5)
NEUT%: 70.8 % (ref 38.4–76.8)
Platelets: 126 10*3/uL — ABNORMAL LOW (ref 145–400)
RBC: 4.22 10*6/uL (ref 3.70–5.45)
RDW: 14.7 % — ABNORMAL HIGH (ref 11.2–14.5)
WBC: 7.1 10*3/uL (ref 3.9–10.3)
lymph#: 1.3 10*3/uL (ref 0.9–3.3)

## 2008-10-16 LAB — CBC WITH DIFFERENTIAL/PLATELET
BASO%: 0.7 % (ref 0.0–2.0)
Basophils Absolute: 0.1 10*3/uL (ref 0.0–0.1)
EOS%: 2 % (ref 0.0–7.0)
Eosinophils Absolute: 0.2 10*3/uL (ref 0.0–0.5)
HCT: 36 % (ref 34.8–46.6)
HGB: 12.2 g/dL (ref 11.6–15.9)
LYMPH%: 18.3 % (ref 14.0–49.7)
MCH: 30.1 pg (ref 25.1–34.0)
MCHC: 34 g/dL (ref 31.5–36.0)
MCV: 88.6 fL (ref 79.5–101.0)
MONO#: 0.7 10*3/uL (ref 0.1–0.9)
MONO%: 9.2 % (ref 0.0–14.0)
NEUT#: 5.5 10*3/uL (ref 1.5–6.5)
NEUT%: 69.8 % (ref 38.4–76.8)
Platelets: 125 10*3/uL — ABNORMAL LOW (ref 145–400)
RBC: 4.06 10*6/uL (ref 3.70–5.45)
RDW: 14.9 % — ABNORMAL HIGH (ref 11.2–14.5)
WBC: 7.9 10*3/uL (ref 3.9–10.3)
lymph#: 1.4 10*3/uL (ref 0.9–3.3)

## 2008-10-21 ENCOUNTER — Ambulatory Visit: Payer: Self-pay | Admitting: Oncology

## 2008-10-30 LAB — CBC WITH DIFFERENTIAL/PLATELET
BASO%: 1 % (ref 0.0–2.0)
Basophils Absolute: 0.1 10*3/uL (ref 0.0–0.1)
EOS%: 1.6 % (ref 0.0–7.0)
Eosinophils Absolute: 0.1 10*3/uL (ref 0.0–0.5)
HCT: 37.4 % (ref 34.8–46.6)
HGB: 12.3 g/dL (ref 11.6–15.9)
LYMPH%: 27.1 % (ref 14.0–49.7)
MCH: 29.1 pg (ref 25.1–34.0)
MCHC: 32.9 g/dL (ref 31.5–36.0)
MCV: 88.6 fL (ref 79.5–101.0)
MONO#: 0.5 10*3/uL (ref 0.1–0.9)
MONO%: 7.1 % (ref 0.0–14.0)
NEUT#: 4.5 10*3/uL (ref 1.5–6.5)
NEUT%: 63.2 % (ref 38.4–76.8)
Platelets: 8 10*3/uL — CL (ref 145–400)
RBC: 4.22 10*6/uL (ref 3.70–5.45)
RDW: 14.2 % (ref 11.2–14.5)
WBC: 7.1 10*3/uL (ref 3.9–10.3)
lymph#: 1.9 10*3/uL (ref 0.9–3.3)
nRBC: 0 % (ref 0–0)

## 2008-11-06 LAB — CBC WITH DIFFERENTIAL/PLATELET
BASO%: 0.7 % (ref 0.0–2.0)
Basophils Absolute: 0 10*3/uL (ref 0.0–0.1)
EOS%: 1.9 % (ref 0.0–7.0)
Eosinophils Absolute: 0.1 10*3/uL (ref 0.0–0.5)
HCT: 37.7 % (ref 34.8–46.6)
HGB: 12.7 g/dL (ref 11.6–15.9)
LYMPH%: 23.6 % (ref 14.0–49.7)
MCH: 30.6 pg (ref 25.1–34.0)
MCHC: 33.7 g/dL (ref 31.5–36.0)
MCV: 90.7 fL (ref 79.5–101.0)
MONO#: 0.7 10*3/uL (ref 0.1–0.9)
MONO%: 10.3 % (ref 0.0–14.0)
NEUT#: 4.1 10*3/uL (ref 1.5–6.5)
NEUT%: 63.5 % (ref 38.4–76.8)
Platelets: 61 10*3/uL — ABNORMAL LOW (ref 145–400)
RBC: 4.16 10*6/uL (ref 3.70–5.45)
RDW: 14.9 % — ABNORMAL HIGH (ref 11.2–14.5)
WBC: 6.4 10*3/uL (ref 3.9–10.3)
lymph#: 1.5 10*3/uL (ref 0.9–3.3)

## 2008-11-06 LAB — LACTATE DEHYDROGENASE: LDH: 154 U/L (ref 94–250)

## 2008-11-06 LAB — COMPREHENSIVE METABOLIC PANEL
ALT: 14 U/L (ref 0–35)
AST: 22 U/L (ref 0–37)
Albumin: 3.4 g/dL — ABNORMAL LOW (ref 3.5–5.2)
Alkaline Phosphatase: 34 U/L — ABNORMAL LOW (ref 39–117)
BUN: 7 mg/dL (ref 6–23)
CO2: 30 mEq/L (ref 19–32)
Calcium: 8.7 mg/dL (ref 8.4–10.5)
Chloride: 105 mEq/L (ref 96–112)
Creatinine, Ser: 0.65 mg/dL (ref 0.40–1.20)
Glucose, Bld: 71 mg/dL (ref 70–99)
Potassium: 3.9 mEq/L (ref 3.5–5.3)
Sodium: 138 mEq/L (ref 135–145)
Total Bilirubin: 0.6 mg/dL (ref 0.3–1.2)
Total Protein: 6.1 g/dL (ref 6.0–8.3)

## 2008-11-13 LAB — CBC WITH DIFFERENTIAL/PLATELET
BASO%: 1.2 % (ref 0.0–2.0)
Basophils Absolute: 0.1 10*3/uL (ref 0.0–0.1)
EOS%: 2 % (ref 0.0–7.0)
Eosinophils Absolute: 0.1 10*3/uL (ref 0.0–0.5)
HCT: 38.5 % (ref 34.8–46.6)
HGB: 12.7 g/dL (ref 11.6–15.9)
LYMPH%: 23.8 % (ref 14.0–49.7)
MCH: 30 pg (ref 25.1–34.0)
MCHC: 33.1 g/dL (ref 31.5–36.0)
MCV: 90.8 fL (ref 79.5–101.0)
MONO#: 0.7 10*3/uL (ref 0.1–0.9)
MONO%: 9.2 % (ref 0.0–14.0)
NEUT#: 4.7 10*3/uL (ref 1.5–6.5)
NEUT%: 63.8 % (ref 38.4–76.8)
Platelets: 112 10*3/uL — ABNORMAL LOW (ref 145–400)
RBC: 4.23 10*6/uL (ref 3.70–5.45)
RDW: 14.9 % — ABNORMAL HIGH (ref 11.2–14.5)
WBC: 7.3 10*3/uL (ref 3.9–10.3)
lymph#: 1.7 10*3/uL (ref 0.9–3.3)

## 2008-11-18 ENCOUNTER — Ambulatory Visit: Payer: Self-pay | Admitting: Oncology

## 2008-11-20 LAB — CBC WITH DIFFERENTIAL/PLATELET
BASO%: 0.9 % (ref 0.0–2.0)
Basophils Absolute: 0.1 10*3/uL (ref 0.0–0.1)
EOS%: 1.2 % (ref 0.0–7.0)
Eosinophils Absolute: 0.1 10*3/uL (ref 0.0–0.5)
HCT: 38.2 % (ref 34.8–46.6)
HGB: 12.7 g/dL (ref 11.6–15.9)
LYMPH%: 27.4 % (ref 14.0–49.7)
MCH: 29.9 pg (ref 25.1–34.0)
MCHC: 33.2 g/dL (ref 31.5–36.0)
MCV: 89.9 fL (ref 79.5–101.0)
MONO#: 0.7 10*3/uL (ref 0.1–0.9)
MONO%: 9.9 % (ref 0.0–14.0)
NEUT#: 4.2 10*3/uL (ref 1.5–6.5)
NEUT%: 60.6 % (ref 38.4–76.8)
Platelets: 121 10*3/uL — ABNORMAL LOW (ref 145–400)
RBC: 4.25 10*6/uL (ref 3.70–5.45)
RDW: 14.3 % (ref 11.2–14.5)
WBC: 6.9 10*3/uL (ref 3.9–10.3)
lymph#: 1.9 10*3/uL (ref 0.9–3.3)

## 2008-11-27 LAB — CBC WITH DIFFERENTIAL/PLATELET
BASO%: 0.8 % (ref 0.0–2.0)
Basophils Absolute: 0.1 10*3/uL (ref 0.0–0.1)
EOS%: 2.5 % (ref 0.0–7.0)
Eosinophils Absolute: 0.2 10*3/uL (ref 0.0–0.5)
HCT: 36 % (ref 34.8–46.6)
HGB: 11.6 g/dL (ref 11.6–15.9)
LYMPH%: 22.6 % (ref 14.0–49.7)
MCH: 28.9 pg (ref 25.1–34.0)
MCHC: 32.2 g/dL (ref 31.5–36.0)
MCV: 89.8 fL (ref 79.5–101.0)
MONO#: 0.5 10*3/uL (ref 0.1–0.9)
MONO%: 7 % (ref 0.0–14.0)
NEUT#: 4.8 10*3/uL (ref 1.5–6.5)
NEUT%: 67.1 % (ref 38.4–76.8)
Platelets: 51 10*3/uL — ABNORMAL LOW (ref 145–400)
RBC: 4.01 10*6/uL (ref 3.70–5.45)
RDW: 14.3 % (ref 11.2–14.5)
WBC: 7.1 10*3/uL (ref 3.9–10.3)
lymph#: 1.6 10*3/uL (ref 0.9–3.3)
nRBC: 0 % (ref 0–0)

## 2008-12-04 LAB — CBC WITH DIFFERENTIAL/PLATELET
BASO%: 0.6 % (ref 0.0–2.0)
Basophils Absolute: 0 10*3/uL (ref 0.0–0.1)
EOS%: 1.8 % (ref 0.0–7.0)
Eosinophils Absolute: 0.1 10*3/uL (ref 0.0–0.5)
HCT: 38.9 % (ref 34.8–46.6)
HGB: 13 g/dL (ref 11.6–15.9)
LYMPH%: 24.7 % (ref 14.0–49.7)
MCH: 30.3 pg (ref 25.1–34.0)
MCHC: 33.4 g/dL (ref 31.5–36.0)
MCV: 90.6 fL (ref 79.5–101.0)
MONO#: 0.6 10*3/uL (ref 0.1–0.9)
MONO%: 8.2 % (ref 0.0–14.0)
NEUT#: 4.6 10*3/uL (ref 1.5–6.5)
NEUT%: 64.7 % (ref 38.4–76.8)
Platelets: 78 10*3/uL — ABNORMAL LOW (ref 145–400)
RBC: 4.29 10*6/uL (ref 3.70–5.45)
RDW: 14.6 % — ABNORMAL HIGH (ref 11.2–14.5)
WBC: 7.1 10*3/uL (ref 3.9–10.3)
lymph#: 1.7 10*3/uL (ref 0.9–3.3)

## 2008-12-11 LAB — CBC WITH DIFFERENTIAL/PLATELET
BASO%: 1.2 % (ref 0.0–2.0)
Basophils Absolute: 0.1 10*3/uL (ref 0.0–0.1)
EOS%: 2.5 % (ref 0.0–7.0)
Eosinophils Absolute: 0.2 10*3/uL (ref 0.0–0.5)
HCT: 38.2 % (ref 34.8–46.6)
HGB: 12.8 g/dL (ref 11.6–15.9)
LYMPH%: 20.1 % (ref 14.0–49.7)
MCH: 30.1 pg (ref 25.1–34.0)
MCHC: 33.5 g/dL (ref 31.5–36.0)
MCV: 89.8 fL (ref 79.5–101.0)
MONO#: 0.6 10*3/uL (ref 0.1–0.9)
MONO%: 10.4 % (ref 0.0–14.0)
NEUT#: 4 10*3/uL (ref 1.5–6.5)
NEUT%: 65.8 % (ref 38.4–76.8)
Platelets: 52 10*3/uL — ABNORMAL LOW (ref 145–400)
RBC: 4.26 10*6/uL (ref 3.70–5.45)
RDW: 14.8 % — ABNORMAL HIGH (ref 11.2–14.5)
WBC: 6.1 10*3/uL (ref 3.9–10.3)
lymph#: 1.2 10*3/uL (ref 0.9–3.3)

## 2008-12-18 ENCOUNTER — Ambulatory Visit: Payer: Self-pay | Admitting: Oncology

## 2008-12-18 LAB — CBC WITH DIFFERENTIAL/PLATELET
BASO%: 0.6 % (ref 0.0–2.0)
Basophils Absolute: 0 10*3/uL (ref 0.0–0.1)
EOS%: 2.4 % (ref 0.0–7.0)
Eosinophils Absolute: 0.2 10*3/uL (ref 0.0–0.5)
HCT: 39.1 % (ref 34.8–46.6)
HGB: 13 g/dL (ref 11.6–15.9)
LYMPH%: 21.4 % (ref 14.0–49.7)
MCH: 30.2 pg (ref 25.1–34.0)
MCHC: 33.4 g/dL (ref 31.5–36.0)
MCV: 90.5 fL (ref 79.5–101.0)
MONO#: 0.6 10*3/uL (ref 0.1–0.9)
MONO%: 7.8 % (ref 0.0–14.0)
NEUT#: 4.8 10*3/uL (ref 1.5–6.5)
NEUT%: 67.8 % (ref 38.4–76.8)
Platelets: 159 10*3/uL (ref 145–400)
RBC: 4.32 10*6/uL (ref 3.70–5.45)
RDW: 15 % — ABNORMAL HIGH (ref 11.2–14.5)
WBC: 7.1 10*3/uL (ref 3.9–10.3)
lymph#: 1.5 10*3/uL (ref 0.9–3.3)

## 2008-12-25 LAB — CBC WITH DIFFERENTIAL/PLATELET
BASO%: 1.7 % (ref 0.0–2.0)
Basophils Absolute: 0.1 10*3/uL (ref 0.0–0.1)
EOS%: 2.5 % (ref 0.0–7.0)
Eosinophils Absolute: 0.2 10*3/uL (ref 0.0–0.5)
HCT: 39 % (ref 34.8–46.6)
HGB: 12.8 g/dL (ref 11.6–15.9)
LYMPH%: 22.2 % (ref 14.0–49.7)
MCH: 29.7 pg (ref 25.1–34.0)
MCHC: 32.7 g/dL (ref 31.5–36.0)
MCV: 90.8 fL (ref 79.5–101.0)
MONO#: 0.7 10*3/uL (ref 0.1–0.9)
MONO%: 10.5 % (ref 0.0–14.0)
NEUT#: 4 10*3/uL (ref 1.5–6.5)
NEUT%: 63.1 % (ref 38.4–76.8)
Platelets: 145 10*3/uL (ref 145–400)
RBC: 4.29 10*6/uL (ref 3.70–5.45)
RDW: 14.8 % — ABNORMAL HIGH (ref 11.2–14.5)
WBC: 6.4 10*3/uL (ref 3.9–10.3)
lymph#: 1.4 10*3/uL (ref 0.9–3.3)

## 2009-01-01 LAB — CBC WITH DIFFERENTIAL/PLATELET
BASO%: 1.4 % (ref 0.0–2.0)
Basophils Absolute: 0.1 10*3/uL (ref 0.0–0.1)
EOS%: 2.9 % (ref 0.0–7.0)
Eosinophils Absolute: 0.2 10*3/uL (ref 0.0–0.5)
HCT: 36.5 % (ref 34.8–46.6)
HGB: 12.2 g/dL (ref 11.6–15.9)
LYMPH%: 23.9 % (ref 14.0–49.7)
MCH: 30 pg (ref 25.1–34.0)
MCHC: 33.4 g/dL (ref 31.5–36.0)
MCV: 89.8 fL (ref 79.5–101.0)
MONO#: 0.6 10*3/uL (ref 0.1–0.9)
MONO%: 10.8 % (ref 0.0–14.0)
NEUT#: 3.5 10*3/uL (ref 1.5–6.5)
NEUT%: 61 % (ref 38.4–76.8)
Platelets: 82 10*3/uL — ABNORMAL LOW (ref 145–400)
RBC: 4.07 10*6/uL (ref 3.70–5.45)
RDW: 14.5 % (ref 11.2–14.5)
WBC: 5.7 10*3/uL (ref 3.9–10.3)
lymph#: 1.4 10*3/uL (ref 0.9–3.3)

## 2009-01-08 LAB — CBC WITH DIFFERENTIAL/PLATELET
BASO%: 1.2 % (ref 0.0–2.0)
Basophils Absolute: 0.1 10*3/uL (ref 0.0–0.1)
EOS%: 3.2 % (ref 0.0–7.0)
Eosinophils Absolute: 0.2 10*3/uL (ref 0.0–0.5)
HCT: 39.6 % (ref 34.8–46.6)
HGB: 13.2 g/dL (ref 11.6–15.9)
LYMPH%: 24.4 % (ref 14.0–49.7)
MCH: 29.7 pg (ref 25.1–34.0)
MCHC: 33.3 g/dL (ref 31.5–36.0)
MCV: 89.3 fL (ref 79.5–101.0)
MONO#: 0.7 10*3/uL (ref 0.1–0.9)
MONO%: 11.7 % (ref 0.0–14.0)
NEUT#: 3.5 10*3/uL (ref 1.5–6.5)
NEUT%: 59.5 % (ref 38.4–76.8)
Platelets: 26 10*3/uL — ABNORMAL LOW (ref 145–400)
RBC: 4.44 10*6/uL (ref 3.70–5.45)
RDW: 14.1 % (ref 11.2–14.5)
WBC: 5.9 10*3/uL (ref 3.9–10.3)
lymph#: 1.4 10*3/uL (ref 0.9–3.3)

## 2009-01-13 ENCOUNTER — Ambulatory Visit: Payer: Self-pay | Admitting: Oncology

## 2009-01-15 LAB — CBC WITH DIFFERENTIAL/PLATELET
BASO%: 0.8 % (ref 0.0–2.0)
Basophils Absolute: 0.1 10*3/uL (ref 0.0–0.1)
EOS%: 2.4 % (ref 0.0–7.0)
Eosinophils Absolute: 0.2 10*3/uL (ref 0.0–0.5)
HCT: 39.3 % (ref 34.8–46.6)
HGB: 13 g/dL (ref 11.6–15.9)
LYMPH%: 13.4 % — ABNORMAL LOW (ref 14.0–49.7)
MCH: 29.5 pg (ref 25.1–34.0)
MCHC: 33.2 g/dL (ref 31.5–36.0)
MCV: 88.9 fL (ref 79.5–101.0)
MONO#: 0.7 10*3/uL (ref 0.1–0.9)
MONO%: 8.4 % (ref 0.0–14.0)
NEUT#: 6.6 10*3/uL — ABNORMAL HIGH (ref 1.5–6.5)
NEUT%: 75 % (ref 38.4–76.8)
Platelets: 32 10*3/uL — ABNORMAL LOW (ref 145–400)
RBC: 4.42 10*6/uL (ref 3.70–5.45)
RDW: 13.8 % (ref 11.2–14.5)
WBC: 8.8 10*3/uL (ref 3.9–10.3)
lymph#: 1.2 10*3/uL (ref 0.9–3.3)

## 2009-01-22 LAB — CBC WITH DIFFERENTIAL/PLATELET
BASO%: 1 % (ref 0.0–2.0)
Basophils Absolute: 0.1 10*3/uL (ref 0.0–0.1)
EOS%: 1.9 % (ref 0.0–7.0)
Eosinophils Absolute: 0.1 10*3/uL (ref 0.0–0.5)
HCT: 38.3 % (ref 34.8–46.6)
HGB: 12.6 g/dL (ref 11.6–15.9)
LYMPH%: 20.2 % (ref 14.0–49.7)
MCH: 29.3 pg (ref 25.1–34.0)
MCHC: 32.9 g/dL (ref 31.5–36.0)
MCV: 89 fL (ref 79.5–101.0)
MONO#: 0.7 10*3/uL (ref 0.1–0.9)
MONO%: 9.2 % (ref 0.0–14.0)
NEUT#: 5.1 10*3/uL (ref 1.5–6.5)
NEUT%: 67.7 % (ref 38.4–76.8)
Platelets: 168 10*3/uL (ref 145–400)
RBC: 4.3 10*6/uL (ref 3.70–5.45)
RDW: 13.9 % (ref 11.2–14.5)
WBC: 7.6 10*3/uL (ref 3.9–10.3)
lymph#: 1.5 10*3/uL (ref 0.9–3.3)

## 2009-01-29 LAB — CBC WITH DIFFERENTIAL/PLATELET
BASO%: 0.5 % (ref 0.0–2.0)
Basophils Absolute: 0 10*3/uL (ref 0.0–0.1)
EOS%: 1.2 % (ref 0.0–7.0)
Eosinophils Absolute: 0.1 10*3/uL (ref 0.0–0.5)
HCT: 35.2 % (ref 34.8–46.6)
HGB: 11.8 g/dL (ref 11.6–15.9)
LYMPH%: 19.1 % (ref 14.0–49.7)
MCH: 29.4 pg (ref 25.1–34.0)
MCHC: 33.4 g/dL (ref 31.5–36.0)
MCV: 88 fL (ref 79.5–101.0)
MONO#: 0.6 10*3/uL (ref 0.1–0.9)
MONO%: 7.7 % (ref 0.0–14.0)
NEUT#: 5.7 10*3/uL (ref 1.5–6.5)
NEUT%: 71.5 % (ref 38.4–76.8)
Platelets: 246 10*3/uL (ref 145–400)
RBC: 4.01 10*6/uL (ref 3.70–5.45)
RDW: 14.3 % (ref 11.2–14.5)
WBC: 8 10*3/uL (ref 3.9–10.3)
lymph#: 1.5 10*3/uL (ref 0.9–3.3)

## 2009-02-05 LAB — CBC WITH DIFFERENTIAL/PLATELET
BASO%: 1.2 % (ref 0.0–2.0)
Basophils Absolute: 0.1 10*3/uL (ref 0.0–0.1)
EOS%: 3.1 % (ref 0.0–7.0)
Eosinophils Absolute: 0.2 10*3/uL (ref 0.0–0.5)
HCT: 36 % (ref 34.8–46.6)
HGB: 11.9 g/dL (ref 11.6–15.9)
LYMPH%: 23.1 % (ref 14.0–49.7)
MCH: 29.4 pg (ref 25.1–34.0)
MCHC: 33.1 g/dL (ref 31.5–36.0)
MCV: 88.7 fL (ref 79.5–101.0)
MONO#: 0.6 10*3/uL (ref 0.1–0.9)
MONO%: 10.3 % (ref 0.0–14.0)
NEUT#: 3.7 10*3/uL (ref 1.5–6.5)
NEUT%: 62.3 % (ref 38.4–76.8)
Platelets: 117 10*3/uL — ABNORMAL LOW (ref 145–400)
RBC: 4.06 10*6/uL (ref 3.70–5.45)
RDW: 14.4 % (ref 11.2–14.5)
WBC: 5.9 10*3/uL (ref 3.9–10.3)
lymph#: 1.4 10*3/uL (ref 0.9–3.3)

## 2009-02-12 ENCOUNTER — Ambulatory Visit: Payer: Self-pay | Admitting: Oncology

## 2009-02-12 LAB — CBC WITH DIFFERENTIAL/PLATELET
BASO%: 0.7 % (ref 0.0–2.0)
Basophils Absolute: 0 10*3/uL (ref 0.0–0.1)
EOS%: 2.8 % (ref 0.0–7.0)
Eosinophils Absolute: 0.2 10*3/uL (ref 0.0–0.5)
HCT: 38 % (ref 34.8–46.6)
HGB: 12.5 g/dL (ref 11.6–15.9)
LYMPH%: 21.3 % (ref 14.0–49.7)
MCH: 29 pg (ref 25.1–34.0)
MCHC: 32.9 g/dL (ref 31.5–36.0)
MCV: 88.2 fL (ref 79.5–101.0)
MONO#: 0.6 10*3/uL (ref 0.1–0.9)
MONO%: 10.1 % (ref 0.0–14.0)
NEUT#: 4 10*3/uL (ref 1.5–6.5)
NEUT%: 65.1 % (ref 38.4–76.8)
Platelets: 186 10*3/uL (ref 145–400)
RBC: 4.3 10*6/uL (ref 3.70–5.45)
RDW: 14.4 % (ref 11.2–14.5)
WBC: 6.1 10*3/uL (ref 3.9–10.3)
lymph#: 1.3 10*3/uL (ref 0.9–3.3)

## 2009-02-19 LAB — CBC WITH DIFFERENTIAL/PLATELET
BASO%: 1.2 % (ref 0.0–2.0)
Basophils Absolute: 0.1 10*3/uL (ref 0.0–0.1)
EOS%: 2.4 % (ref 0.0–7.0)
Eosinophils Absolute: 0.2 10*3/uL (ref 0.0–0.5)
HCT: 37.4 % (ref 34.8–46.6)
HGB: 12.4 g/dL (ref 11.6–15.9)
LYMPH%: 19 % (ref 14.0–49.7)
MCH: 29.1 pg (ref 25.1–34.0)
MCHC: 33 g/dL (ref 31.5–36.0)
MCV: 88.2 fL (ref 79.5–101.0)
MONO#: 0.7 10*3/uL (ref 0.1–0.9)
MONO%: 9.9 % (ref 0.0–14.0)
NEUT#: 4.8 10*3/uL (ref 1.5–6.5)
NEUT%: 67.5 % (ref 38.4–76.8)
Platelets: 245 10*3/uL (ref 145–400)
RBC: 4.24 10*6/uL (ref 3.70–5.45)
RDW: 14.3 % (ref 11.2–14.5)
WBC: 7.1 10*3/uL (ref 3.9–10.3)
lymph#: 1.3 10*3/uL (ref 0.9–3.3)

## 2009-02-26 LAB — CBC WITH DIFFERENTIAL/PLATELET
BASO%: 1.3 % (ref 0.0–2.0)
Basophils Absolute: 0.1 10*3/uL (ref 0.0–0.1)
EOS%: 1.9 % (ref 0.0–7.0)
Eosinophils Absolute: 0.2 10*3/uL (ref 0.0–0.5)
HCT: 36.7 % (ref 34.8–46.6)
HGB: 12 g/dL (ref 11.6–15.9)
LYMPH%: 20.9 % (ref 14.0–49.7)
MCH: 29 pg (ref 25.1–34.0)
MCHC: 32.8 g/dL (ref 31.5–36.0)
MCV: 88.3 fL (ref 79.5–101.0)
MONO#: 0.8 10*3/uL (ref 0.1–0.9)
MONO%: 9.9 % (ref 0.0–14.0)
NEUT#: 5.4 10*3/uL (ref 1.5–6.5)
NEUT%: 66 % (ref 38.4–76.8)
Platelets: 165 10*3/uL (ref 145–400)
RBC: 4.16 10*6/uL (ref 3.70–5.45)
RDW: 14.5 % (ref 11.2–14.5)
WBC: 8.2 10*3/uL (ref 3.9–10.3)
lymph#: 1.7 10*3/uL (ref 0.9–3.3)

## 2009-03-06 LAB — COMPREHENSIVE METABOLIC PANEL
ALT: 13 U/L (ref 0–35)
AST: 20 U/L (ref 0–37)
Albumin: 3.6 g/dL (ref 3.5–5.2)
Alkaline Phosphatase: 48 U/L (ref 39–117)
BUN: 11 mg/dL (ref 6–23)
CO2: 28 mEq/L (ref 19–32)
Calcium: 8.3 mg/dL — ABNORMAL LOW (ref 8.4–10.5)
Chloride: 106 mEq/L (ref 96–112)
Creatinine, Ser: 1.02 mg/dL (ref 0.40–1.20)
Glucose, Bld: 88 mg/dL (ref 70–99)
Potassium: 4 mEq/L (ref 3.5–5.3)
Sodium: 140 mEq/L (ref 135–145)
Total Bilirubin: 0.4 mg/dL (ref 0.3–1.2)
Total Protein: 5.9 g/dL — ABNORMAL LOW (ref 6.0–8.3)

## 2009-03-06 LAB — CBC WITH DIFFERENTIAL/PLATELET
BASO%: 0.7 % (ref 0.0–2.0)
Basophils Absolute: 0 10*3/uL (ref 0.0–0.1)
EOS%: 2.1 % (ref 0.0–7.0)
Eosinophils Absolute: 0.1 10*3/uL (ref 0.0–0.5)
HCT: 37.3 % (ref 34.8–46.6)
HGB: 12.5 g/dL (ref 11.6–15.9)
LYMPH%: 20.5 % (ref 14.0–49.7)
MCH: 29.5 pg (ref 25.1–34.0)
MCHC: 33.5 g/dL (ref 31.5–36.0)
MCV: 87.9 fL (ref 79.5–101.0)
MONO#: 0.6 10*3/uL (ref 0.1–0.9)
MONO%: 10.8 % (ref 0.0–14.0)
NEUT#: 3.8 10*3/uL (ref 1.5–6.5)
NEUT%: 65.9 % (ref 38.4–76.8)
Platelets: 91 10*3/uL — ABNORMAL LOW (ref 145–400)
RBC: 4.24 10*6/uL (ref 3.70–5.45)
RDW: 14.9 % — ABNORMAL HIGH (ref 11.2–14.5)
WBC: 5.8 10*3/uL (ref 3.9–10.3)
lymph#: 1.2 10*3/uL (ref 0.9–3.3)

## 2009-03-06 LAB — PROTIME-INR
INR: 1 — ABNORMAL LOW (ref 2.00–3.50)
Protime: 12 Seconds (ref 10.6–13.4)

## 2009-03-06 LAB — APTT: aPTT: 26 seconds (ref 24–37)

## 2009-03-10 ENCOUNTER — Ambulatory Visit: Payer: Self-pay | Admitting: Oncology

## 2009-03-10 LAB — COMPREHENSIVE METABOLIC PANEL
ALT: 12 U/L (ref 0–35)
AST: 19 U/L (ref 0–37)
Albumin: 3.7 g/dL (ref 3.5–5.2)
Alkaline Phosphatase: 54 U/L (ref 39–117)
BUN: 11 mg/dL (ref 6–23)
CO2: 26 mEq/L (ref 19–32)
Calcium: 8 mg/dL — ABNORMAL LOW (ref 8.4–10.5)
Chloride: 107 mEq/L (ref 96–112)
Creatinine, Ser: 0.67 mg/dL (ref 0.40–1.20)
Glucose, Bld: 69 mg/dL — ABNORMAL LOW (ref 70–99)
Potassium: 4.3 mEq/L (ref 3.5–5.3)
Sodium: 139 mEq/L (ref 135–145)
Total Bilirubin: 0.3 mg/dL (ref 0.3–1.2)
Total Protein: 6.2 g/dL (ref 6.0–8.3)

## 2009-03-10 LAB — LACTATE DEHYDROGENASE: LDH: 167 U/L (ref 94–250)

## 2009-03-12 LAB — CBC WITH DIFFERENTIAL/PLATELET
BASO%: 0.9 % (ref 0.0–2.0)
Basophils Absolute: 0.1 10*3/uL (ref 0.0–0.1)
EOS%: 1.6 % (ref 0.0–7.0)
Eosinophils Absolute: 0.1 10*3/uL (ref 0.0–0.5)
HCT: 39.1 % (ref 34.8–46.6)
HGB: 13 g/dL (ref 11.6–15.9)
LYMPH%: 19.3 % (ref 14.0–49.7)
MCH: 29.3 pg (ref 25.1–34.0)
MCHC: 33.2 g/dL (ref 31.5–36.0)
MCV: 88.3 fL (ref 79.5–101.0)
MONO#: 0.6 10*3/uL (ref 0.1–0.9)
MONO%: 7.3 % (ref 0.0–14.0)
NEUT#: 6 10*3/uL (ref 1.5–6.5)
NEUT%: 70.9 % (ref 38.4–76.8)
Platelets: 114 10*3/uL — ABNORMAL LOW (ref 145–400)
RBC: 4.43 10*6/uL (ref 3.70–5.45)
RDW: 15.1 % — ABNORMAL HIGH (ref 11.2–14.5)
WBC: 8.4 10*3/uL (ref 3.9–10.3)
lymph#: 1.6 10*3/uL (ref 0.9–3.3)

## 2009-03-19 LAB — CBC WITH DIFFERENTIAL/PLATELET
BASO%: 1.1 % (ref 0.0–2.0)
Basophils Absolute: 0.1 10*3/uL (ref 0.0–0.1)
EOS%: 1.2 % (ref 0.0–7.0)
Eosinophils Absolute: 0.1 10*3/uL (ref 0.0–0.5)
HCT: 37.6 % (ref 34.8–46.6)
HGB: 12.5 g/dL (ref 11.6–15.9)
LYMPH%: 22.3 % (ref 14.0–49.7)
MCH: 29.4 pg (ref 25.1–34.0)
MCHC: 33.2 g/dL (ref 31.5–36.0)
MCV: 88.4 fL (ref 79.5–101.0)
MONO#: 0.6 10*3/uL (ref 0.1–0.9)
MONO%: 7.9 % (ref 0.0–14.0)
NEUT#: 5.3 10*3/uL (ref 1.5–6.5)
NEUT%: 67.5 % (ref 38.4–76.8)
Platelets: 243 10*3/uL (ref 145–400)
RBC: 4.26 10*6/uL (ref 3.70–5.45)
RDW: 15.3 % — ABNORMAL HIGH (ref 11.2–14.5)
WBC: 7.9 10*3/uL (ref 3.9–10.3)
lymph#: 1.8 10*3/uL (ref 0.9–3.3)

## 2009-03-26 LAB — CBC WITH DIFFERENTIAL/PLATELET
BASO%: 1.4 % (ref 0.0–2.0)
Basophils Absolute: 0.1 10*3/uL (ref 0.0–0.1)
EOS%: 1 % (ref 0.0–7.0)
Eosinophils Absolute: 0.1 10*3/uL (ref 0.0–0.5)
HCT: 38.1 % (ref 34.8–46.6)
HGB: 12.6 g/dL (ref 11.6–15.9)
LYMPH%: 24.5 % (ref 14.0–49.7)
MCH: 29 pg (ref 25.1–34.0)
MCHC: 33 g/dL (ref 31.5–36.0)
MCV: 87.8 fL (ref 79.5–101.0)
MONO#: 0.6 10*3/uL (ref 0.1–0.9)
MONO%: 9.2 % (ref 0.0–14.0)
NEUT#: 4.5 10*3/uL (ref 1.5–6.5)
NEUT%: 63.9 % (ref 38.4–76.8)
Platelets: 509 10*3/uL — ABNORMAL HIGH (ref 145–400)
RBC: 4.34 10*6/uL (ref 3.70–5.45)
RDW: 15.3 % — ABNORMAL HIGH (ref 11.2–14.5)
WBC: 7.1 10*3/uL (ref 3.9–10.3)
lymph#: 1.7 10*3/uL (ref 0.9–3.3)

## 2009-04-02 LAB — CBC WITH DIFFERENTIAL/PLATELET
BASO%: 0.6 % (ref 0.0–2.0)
Basophils Absolute: 0.1 10*3/uL (ref 0.0–0.1)
EOS%: 1.2 % (ref 0.0–7.0)
Eosinophils Absolute: 0.1 10*3/uL (ref 0.0–0.5)
HCT: 36.4 % (ref 34.8–46.6)
HGB: 12.1 g/dL (ref 11.6–15.9)
LYMPH%: 14.6 % (ref 14.0–49.7)
MCH: 29.3 pg (ref 25.1–34.0)
MCHC: 33.1 g/dL (ref 31.5–36.0)
MCV: 88.5 fL (ref 79.5–101.0)
MONO#: 0.9 10*3/uL (ref 0.1–0.9)
MONO%: 8.9 % (ref 0.0–14.0)
NEUT#: 7.8 10*3/uL — ABNORMAL HIGH (ref 1.5–6.5)
NEUT%: 74.7 % (ref 38.4–76.8)
Platelets: 131 10*3/uL — ABNORMAL LOW (ref 145–400)
RBC: 4.11 10*6/uL (ref 3.70–5.45)
RDW: 15.4 % — ABNORMAL HIGH (ref 11.2–14.5)
WBC: 10.4 10*3/uL — ABNORMAL HIGH (ref 3.9–10.3)
lymph#: 1.5 10*3/uL (ref 0.9–3.3)

## 2009-04-09 ENCOUNTER — Ambulatory Visit: Payer: Self-pay | Admitting: Oncology

## 2009-04-09 LAB — CBC WITH DIFFERENTIAL/PLATELET
BASO%: 1.1 % (ref 0.0–2.0)
Basophils Absolute: 0.1 10*3/uL (ref 0.0–0.1)
EOS%: 1.8 % (ref 0.0–7.0)
Eosinophils Absolute: 0.1 10*3/uL (ref 0.0–0.5)
HCT: 38 % (ref 34.8–46.6)
HGB: 12.5 g/dL (ref 11.6–15.9)
LYMPH%: 24.7 % (ref 14.0–49.7)
MCH: 28.9 pg (ref 25.1–34.0)
MCHC: 32.8 g/dL (ref 31.5–36.0)
MCV: 88 fL (ref 79.5–101.0)
MONO#: 0.5 10*3/uL (ref 0.1–0.9)
MONO%: 8 % (ref 0.0–14.0)
NEUT#: 4.2 10*3/uL (ref 1.5–6.5)
NEUT%: 64.4 % (ref 38.4–76.8)
Platelets: 82 10*3/uL — ABNORMAL LOW (ref 145–400)
RBC: 4.32 10*6/uL (ref 3.70–5.45)
RDW: 15.9 % — ABNORMAL HIGH (ref 11.2–14.5)
WBC: 6.5 10*3/uL (ref 3.9–10.3)
lymph#: 1.6 10*3/uL (ref 0.9–3.3)

## 2009-04-16 LAB — CBC WITH DIFFERENTIAL/PLATELET
BASO%: 1 % (ref 0.0–2.0)
Basophils Absolute: 0.1 10*3/uL (ref 0.0–0.1)
EOS%: 1.4 % (ref 0.0–7.0)
Eosinophils Absolute: 0.1 10*3/uL (ref 0.0–0.5)
HCT: 37.8 % (ref 34.8–46.6)
HGB: 12.4 g/dL (ref 11.6–15.9)
LYMPH%: 25.1 % (ref 14.0–49.7)
MCH: 28.9 pg (ref 25.1–34.0)
MCHC: 32.8 g/dL (ref 31.5–36.0)
MCV: 88.1 fL (ref 79.5–101.0)
MONO#: 0.6 10*3/uL (ref 0.1–0.9)
MONO%: 9.4 % (ref 0.0–14.0)
NEUT#: 4.1 10*3/uL (ref 1.5–6.5)
NEUT%: 63.1 % (ref 38.4–76.8)
Platelets: 94 10*3/uL — ABNORMAL LOW (ref 145–400)
RBC: 4.3 10*6/uL (ref 3.70–5.45)
RDW: 15.7 % — ABNORMAL HIGH (ref 11.2–14.5)
WBC: 6.5 10*3/uL (ref 3.9–10.3)
lymph#: 1.6 10*3/uL (ref 0.9–3.3)

## 2009-04-23 LAB — CBC WITH DIFFERENTIAL/PLATELET
BASO%: 0.4 % (ref 0.0–2.0)
Basophils Absolute: 0 10*3/uL (ref 0.0–0.1)
EOS%: 1.1 % (ref 0.0–7.0)
Eosinophils Absolute: 0.1 10*3/uL (ref 0.0–0.5)
HCT: 37.3 % (ref 34.8–46.6)
HGB: 12.4 g/dL (ref 11.6–15.9)
LYMPH%: 23.5 % (ref 14.0–49.7)
MCH: 29.2 pg (ref 25.1–34.0)
MCHC: 33.1 g/dL (ref 31.5–36.0)
MCV: 88.1 fL (ref 79.5–101.0)
MONO#: 0.6 10*3/uL (ref 0.1–0.9)
MONO%: 7.7 % (ref 0.0–14.0)
NEUT#: 5.1 10*3/uL (ref 1.5–6.5)
NEUT%: 67.3 % (ref 38.4–76.8)
Platelets: 212 10*3/uL (ref 145–400)
RBC: 4.24 10*6/uL (ref 3.70–5.45)
RDW: 15.1 % — ABNORMAL HIGH (ref 11.2–14.5)
WBC: 7.6 10*3/uL (ref 3.9–10.3)
lymph#: 1.8 10*3/uL (ref 0.9–3.3)

## 2009-04-30 LAB — CBC WITH DIFFERENTIAL/PLATELET
BASO%: 0.8 % (ref 0.0–2.0)
Basophils Absolute: 0.1 10*3/uL (ref 0.0–0.1)
EOS%: 1.3 % (ref 0.0–7.0)
Eosinophils Absolute: 0.1 10*3/uL (ref 0.0–0.5)
HCT: 33.8 % — ABNORMAL LOW (ref 34.8–46.6)
HGB: 11.1 g/dL — ABNORMAL LOW (ref 11.6–15.9)
LYMPH%: 22.7 % (ref 14.0–49.7)
MCH: 29.1 pg (ref 25.1–34.0)
MCHC: 33 g/dL (ref 31.5–36.0)
MCV: 88.4 fL (ref 79.5–101.0)
MONO#: 0.6 10*3/uL (ref 0.1–0.9)
MONO%: 10 % (ref 0.0–14.0)
NEUT#: 4 10*3/uL (ref 1.5–6.5)
NEUT%: 65.2 % (ref 38.4–76.8)
Platelets: 149 10*3/uL (ref 145–400)
RBC: 3.83 10*6/uL (ref 3.70–5.45)
RDW: 15.4 % — ABNORMAL HIGH (ref 11.2–14.5)
WBC: 6.1 10*3/uL (ref 3.9–10.3)
lymph#: 1.4 10*3/uL (ref 0.9–3.3)

## 2009-05-07 LAB — CBC WITH DIFFERENTIAL/PLATELET
BASO%: 1 % (ref 0.0–2.0)
Basophils Absolute: 0.1 10*3/uL (ref 0.0–0.1)
EOS%: 0.9 % (ref 0.0–7.0)
Eosinophils Absolute: 0.1 10*3/uL (ref 0.0–0.5)
HCT: 35.2 % (ref 34.8–46.6)
HGB: 11.7 g/dL (ref 11.6–15.9)
LYMPH%: 25.6 % (ref 14.0–49.7)
MCH: 29.2 pg (ref 25.1–34.0)
MCHC: 33.2 g/dL (ref 31.5–36.0)
MCV: 87.8 fL (ref 79.5–101.0)
MONO#: 0.6 10*3/uL (ref 0.1–0.9)
MONO%: 9.1 % (ref 0.0–14.0)
NEUT#: 4.4 10*3/uL (ref 1.5–6.5)
NEUT%: 63.4 % (ref 38.4–76.8)
Platelets: 131 10*3/uL — ABNORMAL LOW (ref 145–400)
RBC: 4.01 10*6/uL (ref 3.70–5.45)
RDW: 16 % — ABNORMAL HIGH (ref 11.2–14.5)
WBC: 7 10*3/uL (ref 3.9–10.3)
lymph#: 1.8 10*3/uL (ref 0.9–3.3)

## 2009-05-14 ENCOUNTER — Ambulatory Visit: Payer: Self-pay | Admitting: Oncology

## 2009-05-14 LAB — CBC WITH DIFFERENTIAL/PLATELET
BASO%: 1.1 % (ref 0.0–2.0)
Basophils Absolute: 0.1 10*3/uL (ref 0.0–0.1)
EOS%: 1.9 % (ref 0.0–7.0)
Eosinophils Absolute: 0.1 10*3/uL (ref 0.0–0.5)
HCT: 36.2 % (ref 34.8–46.6)
HGB: 11.7 g/dL (ref 11.6–15.9)
LYMPH%: 26.8 % (ref 14.0–49.7)
MCH: 28.1 pg (ref 25.1–34.0)
MCHC: 32.3 g/dL (ref 31.5–36.0)
MCV: 87.1 fL (ref 79.5–101.0)
MONO#: 0.6 10*3/uL (ref 0.1–0.9)
MONO%: 9.9 % (ref 0.0–14.0)
NEUT#: 3.7 10*3/uL (ref 1.5–6.5)
NEUT%: 60.3 % (ref 38.4–76.8)
Platelets: 25 10*3/uL — ABNORMAL LOW (ref 145–400)
RBC: 4.16 10*6/uL (ref 3.70–5.45)
RDW: 15.5 % — ABNORMAL HIGH (ref 11.2–14.5)
WBC: 6.1 10*3/uL (ref 3.9–10.3)
lymph#: 1.6 10*3/uL (ref 0.9–3.3)

## 2009-05-21 LAB — CBC WITH DIFFERENTIAL/PLATELET
BASO%: 0.5 % (ref 0.0–2.0)
Basophils Absolute: 0 10*3/uL (ref 0.0–0.1)
EOS%: 1 % (ref 0.0–7.0)
Eosinophils Absolute: 0.1 10*3/uL (ref 0.0–0.5)
HCT: 33.8 % — ABNORMAL LOW (ref 34.8–46.6)
HGB: 11.4 g/dL — ABNORMAL LOW (ref 11.6–15.9)
LYMPH%: 26.2 % (ref 14.0–49.7)
MCH: 28.7 pg (ref 25.1–34.0)
MCHC: 33.7 g/dL (ref 31.5–36.0)
MCV: 85.1 fL (ref 79.5–101.0)
MONO#: 0.7 10*3/uL (ref 0.1–0.9)
MONO%: 11.1 % (ref 0.0–14.0)
NEUT#: 4 10*3/uL (ref 1.5–6.5)
NEUT%: 61.2 % (ref 38.4–76.8)
Platelets: 86 10*3/uL — ABNORMAL LOW (ref 145–400)
RBC: 3.97 10*6/uL (ref 3.70–5.45)
RDW: 14.9 % — ABNORMAL HIGH (ref 11.2–14.5)
WBC: 6.5 10*3/uL (ref 3.9–10.3)
lymph#: 1.7 10*3/uL (ref 0.9–3.3)

## 2009-05-28 LAB — CBC WITH DIFFERENTIAL/PLATELET
BASO%: 0.9 % (ref 0.0–2.0)
Basophils Absolute: 0.1 10*3/uL (ref 0.0–0.1)
EOS%: 1.4 % (ref 0.0–7.0)
Eosinophils Absolute: 0.1 10*3/uL (ref 0.0–0.5)
HCT: 37 % (ref 34.8–46.6)
HGB: 12 g/dL (ref 11.6–15.9)
LYMPH%: 24.5 % (ref 14.0–49.7)
MCH: 27.7 pg (ref 25.1–34.0)
MCHC: 32.4 g/dL (ref 31.5–36.0)
MCV: 85.6 fL (ref 79.5–101.0)
MONO#: 0.8 10*3/uL (ref 0.1–0.9)
MONO%: 11.3 % (ref 0.0–14.0)
NEUT#: 4.2 10*3/uL (ref 1.5–6.5)
NEUT%: 61.9 % (ref 38.4–76.8)
Platelets: 285 10*3/uL (ref 145–400)
RBC: 4.32 10*6/uL (ref 3.70–5.45)
RDW: 15.1 % — ABNORMAL HIGH (ref 11.2–14.5)
WBC: 6.8 10*3/uL (ref 3.9–10.3)
lymph#: 1.7 10*3/uL (ref 0.9–3.3)

## 2009-06-04 LAB — CBC WITH DIFFERENTIAL/PLATELET
BASO%: 1 % (ref 0.0–2.0)
Basophils Absolute: 0.1 10*3/uL (ref 0.0–0.1)
EOS%: 1.6 % (ref 0.0–7.0)
Eosinophils Absolute: 0.1 10*3/uL (ref 0.0–0.5)
HCT: 34 % — ABNORMAL LOW (ref 34.8–46.6)
HGB: 11.3 g/dL — ABNORMAL LOW (ref 11.6–15.9)
LYMPH%: 22.5 % (ref 14.0–49.7)
MCH: 28.5 pg (ref 25.1–34.0)
MCHC: 33.4 g/dL (ref 31.5–36.0)
MCV: 85.3 fL (ref 79.5–101.0)
MONO#: 0.8 10*3/uL (ref 0.1–0.9)
MONO%: 12.3 % (ref 0.0–14.0)
NEUT#: 4.3 10*3/uL (ref 1.5–6.5)
NEUT%: 62.6 % (ref 38.4–76.8)
Platelets: 191 10*3/uL (ref 145–400)
RBC: 3.98 10*6/uL (ref 3.70–5.45)
RDW: 15.2 % — ABNORMAL HIGH (ref 11.2–14.5)
WBC: 6.9 10*3/uL (ref 3.9–10.3)
lymph#: 1.5 10*3/uL (ref 0.9–3.3)

## 2009-06-11 LAB — CBC WITH DIFFERENTIAL/PLATELET
BASO%: 0.9 % (ref 0.0–2.0)
Basophils Absolute: 0.1 10*3/uL (ref 0.0–0.1)
EOS%: 1.3 % (ref 0.0–7.0)
Eosinophils Absolute: 0.1 10*3/uL (ref 0.0–0.5)
HCT: 35.1 % (ref 34.8–46.6)
HGB: 11.5 g/dL — ABNORMAL LOW (ref 11.6–15.9)
LYMPH%: 23.1 % (ref 14.0–49.7)
MCH: 28.1 pg (ref 25.1–34.0)
MCHC: 32.9 g/dL (ref 31.5–36.0)
MCV: 85.6 fL (ref 79.5–101.0)
MONO#: 0.8 10*3/uL (ref 0.1–0.9)
MONO%: 10.6 % (ref 0.0–14.0)
NEUT#: 5 10*3/uL (ref 1.5–6.5)
NEUT%: 64.1 % (ref 38.4–76.8)
Platelets: 137 10*3/uL — ABNORMAL LOW (ref 145–400)
RBC: 4.1 10*6/uL (ref 3.70–5.45)
RDW: 15.1 % — ABNORMAL HIGH (ref 11.2–14.5)
WBC: 7.8 10*3/uL (ref 3.9–10.3)
lymph#: 1.8 10*3/uL (ref 0.9–3.3)

## 2009-06-16 ENCOUNTER — Ambulatory Visit: Payer: Self-pay | Admitting: Oncology

## 2009-06-18 LAB — CBC WITH DIFFERENTIAL/PLATELET
BASO%: 1.1 % (ref 0.0–2.0)
Basophils Absolute: 0.1 10*3/uL (ref 0.0–0.1)
EOS%: 2.2 % (ref 0.0–7.0)
Eosinophils Absolute: 0.2 10*3/uL (ref 0.0–0.5)
HCT: 34.5 % — ABNORMAL LOW (ref 34.8–46.6)
HGB: 11.6 g/dL (ref 11.6–15.9)
LYMPH%: 16.6 % (ref 14.0–49.7)
MCH: 28.8 pg (ref 25.1–34.0)
MCHC: 33.5 g/dL (ref 31.5–36.0)
MCV: 85.8 fL (ref 79.5–101.0)
MONO#: 0.8 10*3/uL (ref 0.1–0.9)
MONO%: 9.1 % (ref 0.0–14.0)
NEUT#: 6 10*3/uL (ref 1.5–6.5)
NEUT%: 71 % (ref 38.4–76.8)
Platelets: 103 10*3/uL — ABNORMAL LOW (ref 145–400)
RBC: 4.03 10*6/uL (ref 3.70–5.45)
RDW: 15 % — ABNORMAL HIGH (ref 11.2–14.5)
WBC: 8.4 10*3/uL (ref 3.9–10.3)
lymph#: 1.4 10*3/uL (ref 0.9–3.3)

## 2009-06-27 LAB — CBC WITH DIFFERENTIAL/PLATELET
BASO%: 0.9 % (ref 0.0–2.0)
Basophils Absolute: 0.1 10*3/uL (ref 0.0–0.1)
EOS%: 1.4 % (ref 0.0–7.0)
Eosinophils Absolute: 0.1 10*3/uL (ref 0.0–0.5)
HCT: 36 % (ref 34.8–46.6)
HGB: 12 g/dL (ref 11.6–15.9)
LYMPH%: 20.9 % (ref 14.0–49.7)
MCH: 28.4 pg (ref 25.1–34.0)
MCHC: 33.4 g/dL (ref 31.5–36.0)
MCV: 84.8 fL (ref 79.5–101.0)
MONO#: 0.5 10*3/uL (ref 0.1–0.9)
MONO%: 8.2 % (ref 0.0–14.0)
NEUT#: 4.3 10*3/uL (ref 1.5–6.5)
NEUT%: 68.6 % (ref 38.4–76.8)
Platelets: 204 10*3/uL (ref 145–400)
RBC: 4.24 10*6/uL (ref 3.70–5.45)
RDW: 14.6 % — ABNORMAL HIGH (ref 11.2–14.5)
WBC: 6.2 10*3/uL (ref 3.9–10.3)
lymph#: 1.3 10*3/uL (ref 0.9–3.3)

## 2009-07-02 LAB — CBC WITH DIFFERENTIAL/PLATELET
BASO%: 1.1 % (ref 0.0–2.0)
Basophils Absolute: 0.1 10*3/uL (ref 0.0–0.1)
EOS%: 3.8 % (ref 0.0–7.0)
Eosinophils Absolute: 0.3 10*3/uL (ref 0.0–0.5)
HCT: 33.3 % — ABNORMAL LOW (ref 34.8–46.6)
HGB: 11.3 g/dL — ABNORMAL LOW (ref 11.6–15.9)
LYMPH%: 16.7 % (ref 14.0–49.7)
MCH: 28.6 pg (ref 25.1–34.0)
MCHC: 34 g/dL (ref 31.5–36.0)
MCV: 84.3 fL (ref 79.5–101.0)
MONO#: 1.1 10*3/uL — ABNORMAL HIGH (ref 0.1–0.9)
MONO%: 13.5 % (ref 0.0–14.0)
NEUT#: 5.1 10*3/uL (ref 1.5–6.5)
NEUT%: 64.9 % (ref 38.4–76.8)
Platelets: 20 10*3/uL — ABNORMAL LOW (ref 145–400)
RBC: 3.95 10*6/uL (ref 3.70–5.45)
RDW: 14.6 % — ABNORMAL HIGH (ref 11.2–14.5)
WBC: 7.8 10*3/uL (ref 3.9–10.3)
lymph#: 1.3 10*3/uL (ref 0.9–3.3)

## 2009-07-04 LAB — CBC WITH DIFFERENTIAL/PLATELET
BASO%: 1 % (ref 0.0–2.0)
Basophils Absolute: 0.1 10*3/uL (ref 0.0–0.1)
EOS%: 3.1 % (ref 0.0–7.0)
Eosinophils Absolute: 0.2 10*3/uL (ref 0.0–0.5)
HCT: 36.7 % (ref 34.8–46.6)
HGB: 11.8 g/dL (ref 11.6–15.9)
LYMPH%: 27.9 % (ref 14.0–49.7)
MCH: 27.3 pg (ref 25.1–34.0)
MCHC: 32.2 g/dL (ref 31.5–36.0)
MCV: 85 fL (ref 79.5–101.0)
MONO#: 0.5 10*3/uL (ref 0.1–0.9)
MONO%: 9.2 % (ref 0.0–14.0)
NEUT#: 3.4 10*3/uL (ref 1.5–6.5)
NEUT%: 58.8 % (ref 38.4–76.8)
Platelets: 49 10*3/uL — ABNORMAL LOW (ref 145–400)
RBC: 4.32 10*6/uL (ref 3.70–5.45)
RDW: 14.4 % (ref 11.2–14.5)
WBC: 5.7 10*3/uL (ref 3.9–10.3)
lymph#: 1.6 10*3/uL (ref 0.9–3.3)
nRBC: 0 % (ref 0–0)

## 2009-07-11 LAB — CBC WITH DIFFERENTIAL/PLATELET
BASO%: 1 % (ref 0.0–2.0)
Basophils Absolute: 0.1 10*3/uL (ref 0.0–0.1)
EOS%: 1.8 % (ref 0.0–7.0)
Eosinophils Absolute: 0.1 10*3/uL (ref 0.0–0.5)
HCT: 36.6 % (ref 34.8–46.6)
HGB: 11.9 g/dL (ref 11.6–15.9)
LYMPH%: 20.3 % (ref 14.0–49.7)
MCH: 27.6 pg (ref 25.1–34.0)
MCHC: 32.5 g/dL (ref 31.5–36.0)
MCV: 84.9 fL (ref 79.5–101.0)
MONO#: 0.7 10*3/uL (ref 0.1–0.9)
MONO%: 9.8 % (ref 0.0–14.0)
NEUT#: 4.5 10*3/uL (ref 1.5–6.5)
NEUT%: 67.1 % (ref 38.4–76.8)
Platelets: 232 10*3/uL (ref 145–400)
RBC: 4.31 10*6/uL (ref 3.70–5.45)
RDW: 14.7 % — ABNORMAL HIGH (ref 11.2–14.5)
WBC: 6.8 10*3/uL (ref 3.9–10.3)
lymph#: 1.4 10*3/uL (ref 0.9–3.3)

## 2009-07-11 LAB — COMPREHENSIVE METABOLIC PANEL
ALT: 23 U/L (ref 0–35)
AST: 27 U/L (ref 0–37)
Albumin: 3.9 g/dL (ref 3.5–5.2)
Alkaline Phosphatase: 56 U/L (ref 39–117)
BUN: 9 mg/dL (ref 6–23)
CO2: 26 mEq/L (ref 19–32)
Calcium: 9.8 mg/dL (ref 8.4–10.5)
Chloride: 104 mEq/L (ref 96–112)
Creatinine, Ser: 0.67 mg/dL (ref 0.40–1.20)
Glucose, Bld: 74 mg/dL (ref 70–99)
Potassium: 4.3 mEq/L (ref 3.5–5.3)
Sodium: 141 mEq/L (ref 135–145)
Total Bilirubin: 0.5 mg/dL (ref 0.3–1.2)
Total Protein: 6.5 g/dL (ref 6.0–8.3)

## 2009-07-11 LAB — LACTATE DEHYDROGENASE: LDH: 164 U/L (ref 94–250)

## 2009-07-16 ENCOUNTER — Ambulatory Visit: Payer: Self-pay | Admitting: Oncology

## 2009-07-16 LAB — CBC WITH DIFFERENTIAL/PLATELET
BASO%: 1.4 % (ref 0.0–2.0)
Basophils Absolute: 0.1 10*3/uL (ref 0.0–0.1)
EOS%: 3.1 % (ref 0.0–7.0)
Eosinophils Absolute: 0.2 10*3/uL (ref 0.0–0.5)
HCT: 32.6 % — ABNORMAL LOW (ref 34.8–46.6)
HGB: 10.9 g/dL — ABNORMAL LOW (ref 11.6–15.9)
LYMPH%: 23.5 % (ref 14.0–49.7)
MCH: 28.2 pg (ref 25.1–34.0)
MCHC: 33.5 g/dL (ref 31.5–36.0)
MCV: 84.2 fL (ref 79.5–101.0)
MONO#: 0.7 10*3/uL (ref 0.1–0.9)
MONO%: 9.6 % (ref 0.0–14.0)
NEUT#: 4.3 10*3/uL (ref 1.5–6.5)
NEUT%: 62.4 % (ref 38.4–76.8)
Platelets: 59 10*3/uL — ABNORMAL LOW (ref 145–400)
RBC: 3.88 10*6/uL (ref 3.70–5.45)
RDW: 15.1 % — ABNORMAL HIGH (ref 11.2–14.5)
WBC: 7 10*3/uL (ref 3.9–10.3)
lymph#: 1.6 10*3/uL (ref 0.9–3.3)

## 2009-07-23 LAB — CBC WITH DIFFERENTIAL/PLATELET
BASO%: 0.8 % (ref 0.0–2.0)
Basophils Absolute: 0.1 10*3/uL (ref 0.0–0.1)
EOS%: 1.6 % (ref 0.0–7.0)
Eosinophils Absolute: 0.1 10*3/uL (ref 0.0–0.5)
HCT: 36.2 % (ref 34.8–46.6)
HGB: 11.8 g/dL (ref 11.6–15.9)
LYMPH%: 24.3 % (ref 14.0–49.7)
MCH: 27.4 pg (ref 25.1–34.0)
MCHC: 32.6 g/dL (ref 31.5–36.0)
MCV: 83.9 fL (ref 79.5–101.0)
MONO#: 0.7 10*3/uL (ref 0.1–0.9)
MONO%: 9.8 % (ref 0.0–14.0)
NEUT#: 4.6 10*3/uL (ref 1.5–6.5)
NEUT%: 63.5 % (ref 38.4–76.8)
Platelets: 126 10*3/uL — ABNORMAL LOW (ref 145–400)
RBC: 4.31 10*6/uL (ref 3.70–5.45)
RDW: 14.5 % (ref 11.2–14.5)
WBC: 7.2 10*3/uL (ref 3.9–10.3)
lymph#: 1.8 10*3/uL (ref 0.9–3.3)

## 2009-07-31 LAB — CBC WITH DIFFERENTIAL/PLATELET
BASO%: 1.6 % (ref 0.0–2.0)
Basophils Absolute: 0.1 10*3/uL (ref 0.0–0.1)
EOS%: 3.3 % (ref 0.0–7.0)
Eosinophils Absolute: 0.2 10*3/uL (ref 0.0–0.5)
HCT: 34.3 % — ABNORMAL LOW (ref 34.8–46.6)
HGB: 11.5 g/dL — ABNORMAL LOW (ref 11.6–15.9)
LYMPH%: 27.2 % (ref 14.0–49.7)
MCH: 28 pg (ref 25.1–34.0)
MCHC: 33.4 g/dL (ref 31.5–36.0)
MCV: 84.1 fL (ref 79.5–101.0)
MONO#: 0.6 10*3/uL (ref 0.1–0.9)
MONO%: 10.6 % (ref 0.0–14.0)
NEUT#: 3 10*3/uL (ref 1.5–6.5)
NEUT%: 57.3 % (ref 38.4–76.8)
Platelets: 178 10*3/uL (ref 145–400)
RBC: 4.08 10*6/uL (ref 3.70–5.45)
RDW: 15.1 % — ABNORMAL HIGH (ref 11.2–14.5)
WBC: 5.3 10*3/uL (ref 3.9–10.3)
lymph#: 1.4 10*3/uL (ref 0.9–3.3)

## 2009-08-06 LAB — CBC WITH DIFFERENTIAL/PLATELET
BASO%: 0.8 % (ref 0.0–2.0)
Basophils Absolute: 0.1 10*3/uL (ref 0.0–0.1)
EOS%: 2.2 % (ref 0.0–7.0)
Eosinophils Absolute: 0.1 10*3/uL (ref 0.0–0.5)
HCT: 34.3 % — ABNORMAL LOW (ref 34.8–46.6)
HGB: 11.2 g/dL — ABNORMAL LOW (ref 11.6–15.9)
LYMPH%: 26.1 % (ref 14.0–49.7)
MCH: 27.3 pg (ref 25.1–34.0)
MCHC: 32.8 g/dL (ref 31.5–36.0)
MCV: 83.4 fL (ref 79.5–101.0)
MONO#: 0.6 10*3/uL (ref 0.1–0.9)
MONO%: 10.1 % (ref 0.0–14.0)
NEUT#: 3.7 10*3/uL (ref 1.5–6.5)
NEUT%: 60.8 % (ref 38.4–76.8)
Platelets: 163 10*3/uL (ref 145–400)
RBC: 4.11 10*6/uL (ref 3.70–5.45)
RDW: 14.5 % (ref 11.2–14.5)
WBC: 6.1 10*3/uL (ref 3.9–10.3)
lymph#: 1.6 10*3/uL (ref 0.9–3.3)

## 2009-08-06 LAB — COMPREHENSIVE METABOLIC PANEL
ALT: 23 U/L (ref 0–35)
AST: 26 U/L (ref 0–37)
Albumin: 3.7 g/dL (ref 3.5–5.2)
Alkaline Phosphatase: 49 U/L (ref 39–117)
BUN: 9 mg/dL (ref 6–23)
CO2: 28 mEq/L (ref 19–32)
Calcium: 9.1 mg/dL (ref 8.4–10.5)
Chloride: 106 mEq/L (ref 96–112)
Creatinine, Ser: 0.77 mg/dL (ref 0.40–1.20)
Glucose, Bld: 94 mg/dL (ref 70–99)
Potassium: 4.2 mEq/L (ref 3.5–5.3)
Sodium: 140 mEq/L (ref 135–145)
Total Bilirubin: 0.9 mg/dL (ref 0.3–1.2)
Total Protein: 6.5 g/dL (ref 6.0–8.3)

## 2009-08-06 LAB — LACTATE DEHYDROGENASE: LDH: 174 U/L (ref 94–250)

## 2009-08-13 LAB — CBC WITH DIFFERENTIAL/PLATELET
BASO%: 1 % (ref 0.0–2.0)
Basophils Absolute: 0.1 10*3/uL (ref 0.0–0.1)
EOS%: 2.3 % (ref 0.0–7.0)
Eosinophils Absolute: 0.2 10*3/uL (ref 0.0–0.5)
HCT: 35.8 % (ref 34.8–46.6)
HGB: 11.7 g/dL (ref 11.6–15.9)
LYMPH%: 20.8 % (ref 14.0–49.7)
MCH: 27.3 pg (ref 25.1–34.0)
MCHC: 32.7 g/dL (ref 31.5–36.0)
MCV: 83.6 fL (ref 79.5–101.0)
MONO#: 0.8 10*3/uL (ref 0.1–0.9)
MONO%: 10.6 % (ref 0.0–14.0)
NEUT#: 5.1 10*3/uL (ref 1.5–6.5)
NEUT%: 65.3 % (ref 38.4–76.8)
Platelets: 112 10*3/uL — ABNORMAL LOW (ref 145–400)
RBC: 4.28 10*6/uL (ref 3.70–5.45)
RDW: 15.1 % — ABNORMAL HIGH (ref 11.2–14.5)
WBC: 7.8 10*3/uL (ref 3.9–10.3)
lymph#: 1.6 10*3/uL (ref 0.9–3.3)

## 2009-08-19 ENCOUNTER — Ambulatory Visit: Payer: Self-pay | Admitting: Oncology

## 2009-08-21 LAB — CBC WITH DIFFERENTIAL/PLATELET
BASO%: 1.3 % (ref 0.0–2.0)
Basophils Absolute: 0.1 10*3/uL (ref 0.0–0.1)
EOS%: 3.4 % (ref 0.0–7.0)
Eosinophils Absolute: 0.2 10*3/uL (ref 0.0–0.5)
HCT: 34.2 % — ABNORMAL LOW (ref 34.8–46.6)
HGB: 11.4 g/dL — ABNORMAL LOW (ref 11.6–15.9)
LYMPH%: 27.1 % (ref 14.0–49.7)
MCH: 27.7 pg (ref 25.1–34.0)
MCHC: 33.2 g/dL (ref 31.5–36.0)
MCV: 83.3 fL (ref 79.5–101.0)
MONO#: 0.8 10*3/uL (ref 0.1–0.9)
MONO%: 12.8 % (ref 0.0–14.0)
NEUT#: 3.4 10*3/uL (ref 1.5–6.5)
NEUT%: 55.4 % (ref 38.4–76.8)
Platelets: 116 10*3/uL — ABNORMAL LOW (ref 145–400)
RBC: 4.1 10*6/uL (ref 3.70–5.45)
RDW: 15.4 % — ABNORMAL HIGH (ref 11.2–14.5)
WBC: 6.1 10*3/uL (ref 3.9–10.3)
lymph#: 1.7 10*3/uL (ref 0.9–3.3)

## 2009-08-27 LAB — CBC WITH DIFFERENTIAL/PLATELET
BASO%: 0.9 % (ref 0.0–2.0)
Basophils Absolute: 0.1 10*3/uL (ref 0.0–0.1)
EOS%: 1.7 % (ref 0.0–7.0)
Eosinophils Absolute: 0.1 10*3/uL (ref 0.0–0.5)
HCT: 35.2 % (ref 34.8–46.6)
HGB: 11.3 g/dL — ABNORMAL LOW (ref 11.6–15.9)
LYMPH%: 24.4 % (ref 14.0–49.7)
MCH: 26.4 pg (ref 25.1–34.0)
MCHC: 32 g/dL (ref 31.5–36.0)
MCV: 82.5 fL (ref 79.5–101.0)
MONO#: 0.6 10*3/uL (ref 0.1–0.9)
MONO%: 7.7 % (ref 0.0–14.0)
NEUT#: 5.3 10*3/uL (ref 1.5–6.5)
NEUT%: 65.3 % (ref 38.4–76.8)
Platelets: 201 10*3/uL (ref 145–400)
RBC: 4.27 10*6/uL (ref 3.70–5.45)
RDW: 15.7 % — ABNORMAL HIGH (ref 11.2–14.5)
WBC: 8.2 10*3/uL (ref 3.9–10.3)
lymph#: 2 10*3/uL (ref 0.9–3.3)

## 2009-09-03 LAB — CBC WITH DIFFERENTIAL/PLATELET
BASO%: 1.5 % (ref 0.0–2.0)
Basophils Absolute: 0.1 10*3/uL (ref 0.0–0.1)
EOS%: 2.8 % (ref 0.0–7.0)
Eosinophils Absolute: 0.2 10*3/uL (ref 0.0–0.5)
HCT: 32.5 % — ABNORMAL LOW (ref 34.8–46.6)
HGB: 10.6 g/dL — ABNORMAL LOW (ref 11.6–15.9)
LYMPH%: 23.6 % (ref 14.0–49.7)
MCH: 26.9 pg (ref 25.1–34.0)
MCHC: 32.7 g/dL (ref 31.5–36.0)
MCV: 82.3 fL (ref 79.5–101.0)
MONO#: 0.6 10*3/uL (ref 0.1–0.9)
MONO%: 9.8 % (ref 0.0–14.0)
NEUT#: 3.7 10*3/uL (ref 1.5–6.5)
NEUT%: 62.3 % (ref 38.4–76.8)
Platelets: 285 10*3/uL (ref 145–400)
RBC: 3.95 10*6/uL (ref 3.70–5.45)
RDW: 15.5 % — ABNORMAL HIGH (ref 11.2–14.5)
WBC: 6 10*3/uL (ref 3.9–10.3)
lymph#: 1.4 10*3/uL (ref 0.9–3.3)

## 2009-09-18 ENCOUNTER — Ambulatory Visit: Payer: Self-pay | Admitting: Oncology

## 2009-09-18 ENCOUNTER — Inpatient Hospital Stay (HOSPITAL_COMMUNITY): Admission: EM | Admit: 2009-09-18 | Discharge: 2009-09-20 | Payer: Self-pay | Admitting: Oncology

## 2009-09-18 LAB — CBC WITH DIFFERENTIAL/PLATELET
BASO%: 1 % (ref 0.0–2.0)
Basophils Absolute: 0.1 10*3/uL (ref 0.0–0.1)
EOS%: 2.6 % (ref 0.0–7.0)
Eosinophils Absolute: 0.2 10*3/uL (ref 0.0–0.5)
HCT: 36.5 % (ref 34.8–46.6)
HGB: 11.7 g/dL (ref 11.6–15.9)
LYMPH%: 20.2 % (ref 14.0–49.7)
MCH: 26.5 pg (ref 25.1–34.0)
MCHC: 32.1 g/dL (ref 31.5–36.0)
MCV: 82.5 fL (ref 79.5–101.0)
MONO#: 0.8 10*3/uL (ref 0.1–0.9)
MONO%: 10 % (ref 0.0–14.0)
NEUT#: 5.6 10*3/uL (ref 1.5–6.5)
NEUT%: 66.2 % (ref 38.4–76.8)
Platelets: <6 10*3/uL — CL (ref 145–400)
RBC: 4.42 10*6/uL (ref 3.70–5.45)
RDW: 15.6 % — ABNORMAL HIGH (ref 11.2–14.5)
WBC: 8.4 10*3/uL (ref 3.9–10.3)
lymph#: 1.7 10*3/uL (ref 0.9–3.3)

## 2009-09-22 LAB — CBC WITH DIFFERENTIAL/PLATELET
BASO%: 0 % (ref 0.0–2.0)
Basophils Absolute: 0 10*3/uL (ref 0.0–0.1)
EOS%: 0 % (ref 0.0–7.0)
Eosinophils Absolute: 0 10*3/uL (ref 0.0–0.5)
HCT: 35.4 % (ref 34.8–46.6)
HGB: 11.8 g/dL (ref 11.6–15.9)
LYMPH%: 5.3 % — ABNORMAL LOW (ref 14.0–49.7)
MCH: 26.7 pg (ref 25.1–34.0)
MCHC: 33.2 g/dL (ref 31.5–36.0)
MCV: 80.3 fL (ref 79.5–101.0)
MONO#: 0.3 10*3/uL (ref 0.1–0.9)
MONO%: 2.2 % (ref 0.0–14.0)
NEUT#: 10.7 10*3/uL — ABNORMAL HIGH (ref 1.5–6.5)
NEUT%: 92.5 % — ABNORMAL HIGH (ref 38.4–76.8)
Platelets: 249 10*3/uL (ref 145–400)
RBC: 4.41 10*6/uL (ref 3.70–5.45)
RDW: 15.4 % — ABNORMAL HIGH (ref 11.2–14.5)
WBC: 11.6 10*3/uL — ABNORMAL HIGH (ref 3.9–10.3)
lymph#: 0.6 10*3/uL — ABNORMAL LOW (ref 0.9–3.3)

## 2009-09-25 LAB — IRON AND TIBC
%SAT: 36 % (ref 20–55)
Iron: 123 ug/dL (ref 42–145)
TIBC: 344 ug/dL (ref 250–470)
UIBC: 221 ug/dL

## 2009-09-25 LAB — CBC WITH DIFFERENTIAL/PLATELET
BASO%: 0.5 % (ref 0.0–2.0)
Basophils Absolute: 0 10*3/uL (ref 0.0–0.1)
EOS%: 1 % (ref 0.0–7.0)
Eosinophils Absolute: 0.1 10*3/uL (ref 0.0–0.5)
HCT: 33.6 % — ABNORMAL LOW (ref 34.8–46.6)
HGB: 11.3 g/dL — ABNORMAL LOW (ref 11.6–15.9)
LYMPH%: 27.1 % (ref 14.0–49.7)
MCH: 27.1 pg (ref 25.1–34.0)
MCHC: 33.5 g/dL (ref 31.5–36.0)
MCV: 80.7 fL (ref 79.5–101.0)
MONO#: 1.3 10*3/uL — ABNORMAL HIGH (ref 0.1–0.9)
MONO%: 11.6 % (ref 0.0–14.0)
NEUT#: 6.5 10*3/uL (ref 1.5–6.5)
NEUT%: 59.8 % (ref 38.4–76.8)
Platelets: 620 10*3/uL — ABNORMAL HIGH (ref 145–400)
RBC: 4.16 10*6/uL (ref 3.70–5.45)
RDW: 15.1 % — ABNORMAL HIGH (ref 11.2–14.5)
WBC: 10.9 10*3/uL — ABNORMAL HIGH (ref 3.9–10.3)
lymph#: 3 10*3/uL (ref 0.9–3.3)

## 2009-09-25 LAB — FERRITIN: Ferritin: 16 ng/mL (ref 10–291)

## 2009-09-25 LAB — VITAMIN D 25 HYDROXY (VIT D DEFICIENCY, FRACTURES): Vit D, 25-Hydroxy: 43 ng/mL (ref 30–89)

## 2009-09-29 LAB — CBC WITH DIFFERENTIAL/PLATELET
BASO%: 0.8 % (ref 0.0–2.0)
Basophils Absolute: 0.1 10*3/uL (ref 0.0–0.1)
EOS%: 0.7 % (ref 0.0–7.0)
Eosinophils Absolute: 0.1 10*3/uL (ref 0.0–0.5)
HCT: 33 % — ABNORMAL LOW (ref 34.8–46.6)
HGB: 10.9 g/dL — ABNORMAL LOW (ref 11.6–15.9)
LYMPH%: 25.6 % (ref 14.0–49.7)
MCH: 27.2 pg (ref 25.1–34.0)
MCHC: 32.8 g/dL (ref 31.5–36.0)
MCV: 82.9 fL (ref 79.5–101.0)
MONO#: 0.9 10*3/uL (ref 0.1–0.9)
MONO%: 8.8 % (ref 0.0–14.0)
NEUT#: 6.5 10*3/uL (ref 1.5–6.5)
NEUT%: 64.1 % (ref 38.4–76.8)
Platelets: 670 10*3/uL — ABNORMAL HIGH (ref 145–400)
RBC: 3.98 10*6/uL (ref 3.70–5.45)
RDW: 16.3 % — ABNORMAL HIGH (ref 11.2–14.5)
WBC: 10.2 10*3/uL (ref 3.9–10.3)
lymph#: 2.6 10*3/uL (ref 0.9–3.3)

## 2009-10-02 LAB — CBC WITH DIFFERENTIAL/PLATELET
BASO%: 0.4 % (ref 0.0–2.0)
Basophils Absolute: 0 10*3/uL (ref 0.0–0.1)
EOS%: 0.2 % (ref 0.0–7.0)
Eosinophils Absolute: 0 10*3/uL (ref 0.0–0.5)
HCT: 34.1 % — ABNORMAL LOW (ref 34.8–46.6)
HGB: 11.7 g/dL (ref 11.6–15.9)
LYMPH%: 7 % — ABNORMAL LOW (ref 14.0–49.7)
MCH: 28.4 pg (ref 25.1–34.0)
MCHC: 34.3 g/dL (ref 31.5–36.0)
MCV: 82.8 fL (ref 79.5–101.0)
MONO#: 0.3 10*3/uL (ref 0.1–0.9)
MONO%: 2.8 % (ref 0.0–14.0)
NEUT#: 9 10*3/uL — ABNORMAL HIGH (ref 1.5–6.5)
NEUT%: 89.6 % — ABNORMAL HIGH (ref 38.4–76.8)
Platelets: 418 10*3/uL — ABNORMAL HIGH (ref 145–400)
RBC: 4.12 10*6/uL (ref 3.70–5.45)
RDW: 18 % — ABNORMAL HIGH (ref 11.2–14.5)
WBC: 10 10*3/uL (ref 3.9–10.3)
lymph#: 0.7 10*3/uL — ABNORMAL LOW (ref 0.9–3.3)

## 2009-10-09 LAB — CBC WITH DIFFERENTIAL/PLATELET
BASO%: 1.2 % (ref 0.0–2.0)
Basophils Absolute: 0.1 10*3/uL (ref 0.0–0.1)
EOS%: 1.8 % (ref 0.0–7.0)
Eosinophils Absolute: 0.1 10*3/uL (ref 0.0–0.5)
HCT: 35 % (ref 34.8–46.6)
HGB: 11.2 g/dL — ABNORMAL LOW (ref 11.6–15.9)
LYMPH%: 39.5 % (ref 14.0–49.7)
MCH: 27.5 pg (ref 25.1–34.0)
MCHC: 32 g/dL (ref 31.5–36.0)
MCV: 85.8 fL (ref 79.5–101.0)
MONO#: 0.6 10*3/uL (ref 0.1–0.9)
MONO%: 13.1 % (ref 0.0–14.0)
NEUT#: 2.2 10*3/uL (ref 1.5–6.5)
NEUT%: 44.4 % (ref 38.4–76.8)
Platelets: 52 10*3/uL — ABNORMAL LOW (ref 145–400)
RBC: 4.08 10*6/uL (ref 3.70–5.45)
RDW: 18.3 % — ABNORMAL HIGH (ref 11.2–14.5)
WBC: 4.9 10*3/uL (ref 3.9–10.3)
lymph#: 1.9 10*3/uL (ref 0.9–3.3)

## 2009-10-16 LAB — CBC WITH DIFFERENTIAL/PLATELET
BASO%: 1.3 % (ref 0.0–2.0)
Basophils Absolute: 0.1 10*3/uL (ref 0.0–0.1)
EOS%: 1.9 % (ref 0.0–7.0)
Eosinophils Absolute: 0.1 10*3/uL (ref 0.0–0.5)
HCT: 35.2 % (ref 34.8–46.6)
HGB: 11.9 g/dL (ref 11.6–15.9)
LYMPH%: 24 % (ref 14.0–49.7)
MCH: 28.8 pg (ref 25.1–34.0)
MCHC: 34 g/dL (ref 31.5–36.0)
MCV: 84.6 fL (ref 79.5–101.0)
MONO#: 0.7 10*3/uL (ref 0.1–0.9)
MONO%: 13.9 % (ref 0.0–14.0)
NEUT#: 2.8 10*3/uL (ref 1.5–6.5)
NEUT%: 58.9 % (ref 38.4–76.8)
Platelets: 93 10*3/uL — ABNORMAL LOW (ref 145–400)
RBC: 4.16 10*6/uL (ref 3.70–5.45)
RDW: 19.7 % — ABNORMAL HIGH (ref 11.2–14.5)
WBC: 4.8 10*3/uL (ref 3.9–10.3)
lymph#: 1.2 10*3/uL (ref 0.9–3.3)

## 2009-10-21 ENCOUNTER — Ambulatory Visit: Payer: Self-pay | Admitting: Oncology

## 2009-10-23 LAB — CBC WITH DIFFERENTIAL/PLATELET
BASO%: 1.4 % (ref 0.0–2.0)
Basophils Absolute: 0.1 10*3/uL (ref 0.0–0.1)
EOS%: 1.6 % (ref 0.0–7.0)
Eosinophils Absolute: 0.1 10*3/uL (ref 0.0–0.5)
HCT: 35 % (ref 34.8–46.6)
HGB: 11.8 g/dL (ref 11.6–15.9)
LYMPH%: 26.9 % (ref 14.0–49.7)
MCH: 28.5 pg (ref 25.1–34.0)
MCHC: 33.7 g/dL (ref 31.5–36.0)
MCV: 84.4 fL (ref 79.5–101.0)
MONO#: 0.6 10*3/uL (ref 0.1–0.9)
MONO%: 11.2 % (ref 0.0–14.0)
NEUT#: 3.1 10*3/uL (ref 1.5–6.5)
NEUT%: 58.9 % (ref 38.4–76.8)
Platelets: 548 10*3/uL — ABNORMAL HIGH (ref 145–400)
RBC: 4.14 10*6/uL (ref 3.70–5.45)
RDW: 19.7 % — ABNORMAL HIGH (ref 11.2–14.5)
WBC: 5.2 10*3/uL (ref 3.9–10.3)
lymph#: 1.4 10*3/uL (ref 0.9–3.3)

## 2009-10-27 ENCOUNTER — Encounter: Admission: RE | Admit: 2009-10-27 | Discharge: 2009-10-27 | Payer: Self-pay | Admitting: General Surgery

## 2009-10-30 LAB — CBC WITH DIFFERENTIAL/PLATELET
BASO%: 1.3 % (ref 0.0–2.0)
Basophils Absolute: 0.1 10*3/uL (ref 0.0–0.1)
EOS%: 0.9 % (ref 0.0–7.0)
Eosinophils Absolute: 0 10*3/uL (ref 0.0–0.5)
HCT: 34.7 % — ABNORMAL LOW (ref 34.8–46.6)
HGB: 11.7 g/dL (ref 11.6–15.9)
LYMPH%: 26.9 % (ref 14.0–49.7)
MCH: 28.9 pg (ref 25.1–34.0)
MCHC: 33.8 g/dL (ref 31.5–36.0)
MCV: 85.4 fL (ref 79.5–101.0)
MONO#: 0.6 10*3/uL (ref 0.1–0.9)
MONO%: 10.6 % (ref 0.0–14.0)
NEUT#: 3.2 10*3/uL (ref 1.5–6.5)
NEUT%: 60.3 % (ref 38.4–76.8)
Platelets: 242 10*3/uL (ref 145–400)
RBC: 4.06 10*6/uL (ref 3.70–5.45)
RDW: 19.4 % — ABNORMAL HIGH (ref 11.2–14.5)
WBC: 5.4 10*3/uL (ref 3.9–10.3)
lymph#: 1.4 10*3/uL (ref 0.9–3.3)

## 2009-11-05 ENCOUNTER — Encounter (INDEPENDENT_AMBULATORY_CARE_PROVIDER_SITE_OTHER): Payer: Self-pay | Admitting: General Surgery

## 2009-11-05 ENCOUNTER — Inpatient Hospital Stay (HOSPITAL_COMMUNITY): Admission: RE | Admit: 2009-11-05 | Discharge: 2009-11-08 | Payer: Self-pay | Admitting: General Surgery

## 2009-11-11 LAB — CBC WITH DIFFERENTIAL/PLATELET
BASO%: 1.5 % (ref 0.0–2.0)
Basophils Absolute: 0.2 10*3/uL — ABNORMAL HIGH (ref 0.0–0.1)
EOS%: 4.7 % (ref 0.0–7.0)
Eosinophils Absolute: 0.5 10*3/uL (ref 0.0–0.5)
HCT: 26.9 % — ABNORMAL LOW (ref 34.8–46.6)
HGB: 8.7 g/dL — ABNORMAL LOW (ref 11.6–15.9)
LYMPH%: 12.5 % — ABNORMAL LOW (ref 14.0–49.7)
MCH: 28 pg (ref 25.1–34.0)
MCHC: 32.2 g/dL (ref 31.5–36.0)
MCV: 87 fL (ref 79.5–101.0)
MONO#: 1.8 10*3/uL — ABNORMAL HIGH (ref 0.1–0.9)
MONO%: 17.6 % — ABNORMAL HIGH (ref 0.0–14.0)
NEUT#: 6.5 10*3/uL (ref 1.5–6.5)
NEUT%: 63.7 % (ref 38.4–76.8)
Platelets: 28 10*3/uL — ABNORMAL LOW (ref 145–400)
RBC: 3.09 10*6/uL — ABNORMAL LOW (ref 3.70–5.45)
RDW: 20.1 % — ABNORMAL HIGH (ref 11.2–14.5)
WBC: 10.3 10*3/uL (ref 3.9–10.3)
lymph#: 1.3 10*3/uL (ref 0.9–3.3)

## 2009-11-17 ENCOUNTER — Ambulatory Visit (HOSPITAL_COMMUNITY): Admission: RE | Admit: 2009-11-17 | Discharge: 2009-11-17 | Payer: Self-pay | Admitting: General Surgery

## 2009-11-19 LAB — CBC WITH DIFFERENTIAL/PLATELET
BASO%: 0.5 % (ref 0.0–2.0)
Basophils Absolute: 0.1 10*3/uL (ref 0.0–0.1)
EOS%: 4.3 % (ref 0.0–7.0)
Eosinophils Absolute: 0.4 10*3/uL (ref 0.0–0.5)
HCT: 31 % — ABNORMAL LOW (ref 34.8–46.6)
HGB: 10.1 g/dL — ABNORMAL LOW (ref 11.6–15.9)
LYMPH%: 14.2 % (ref 14.0–49.7)
MCH: 29.1 pg (ref 25.1–34.0)
MCHC: 32.6 g/dL (ref 31.5–36.0)
MCV: 89.4 fL (ref 79.5–101.0)
MONO#: 1.3 10*3/uL — ABNORMAL HIGH (ref 0.1–0.9)
MONO%: 12.4 % (ref 0.0–14.0)
NEUT#: 7.1 10*3/uL — ABNORMAL HIGH (ref 1.5–6.5)
NEUT%: 68.6 % (ref 38.4–76.8)
Platelets: 171 10*3/uL (ref 145–400)
RBC: 3.47 10*6/uL — ABNORMAL LOW (ref 3.70–5.45)
RDW: 21.1 % — ABNORMAL HIGH (ref 11.2–14.5)
WBC: 10.4 10*3/uL — ABNORMAL HIGH (ref 3.9–10.3)
lymph#: 1.5 10*3/uL (ref 0.9–3.3)

## 2009-11-25 ENCOUNTER — Ambulatory Visit: Payer: Self-pay | Admitting: Oncology

## 2009-11-26 LAB — CBC WITH DIFFERENTIAL/PLATELET
BASO%: 1.6 % (ref 0.0–2.0)
Basophils Absolute: 0.1 10*3/uL (ref 0.0–0.1)
EOS%: 6.4 % (ref 0.0–7.0)
Eosinophils Absolute: 0.5 10*3/uL (ref 0.0–0.5)
HCT: 33.2 % — ABNORMAL LOW (ref 34.8–46.6)
HGB: 10.9 g/dL — ABNORMAL LOW (ref 11.6–15.9)
LYMPH%: 20.1 % (ref 14.0–49.7)
MCH: 29.2 pg (ref 25.1–34.0)
MCHC: 32.9 g/dL (ref 31.5–36.0)
MCV: 88.8 fL (ref 79.5–101.0)
MONO#: 1 10*3/uL — ABNORMAL HIGH (ref 0.1–0.9)
MONO%: 13.2 % (ref 0.0–14.0)
NEUT#: 4.3 10*3/uL (ref 1.5–6.5)
NEUT%: 58.7 % (ref 38.4–76.8)
Platelets: 37 10*3/uL — ABNORMAL LOW (ref 145–400)
RBC: 3.74 10*6/uL (ref 3.70–5.45)
RDW: 19.2 % — ABNORMAL HIGH (ref 11.2–14.5)
WBC: 7.3 10*3/uL (ref 3.9–10.3)
lymph#: 1.5 10*3/uL (ref 0.9–3.3)

## 2009-12-02 LAB — CBC WITH DIFFERENTIAL/PLATELET
BASO%: 1.1 % (ref 0.0–2.0)
Basophils Absolute: 0.1 10*3/uL (ref 0.0–0.1)
EOS%: 4.2 % (ref 0.0–7.0)
Eosinophils Absolute: 0.3 10*3/uL (ref 0.0–0.5)
HCT: 36.5 % (ref 34.8–46.6)
HGB: 11.6 g/dL (ref 11.6–15.9)
LYMPH%: 22.6 % (ref 14.0–49.7)
MCH: 27.4 pg (ref 25.1–34.0)
MCHC: 31.8 g/dL (ref 31.5–36.0)
MCV: 86.3 fL (ref 79.5–101.0)
MONO#: 0.9 10*3/uL (ref 0.1–0.9)
MONO%: 11.3 % (ref 0.0–14.0)
NEUT#: 5 10*3/uL (ref 1.5–6.5)
NEUT%: 60.8 % (ref 38.4–76.8)
Platelets: 60 10*3/uL — ABNORMAL LOW (ref 145–400)
RBC: 4.23 10*6/uL (ref 3.70–5.45)
RDW: 16 % — ABNORMAL HIGH (ref 11.2–14.5)
WBC: 8.1 10*3/uL (ref 3.9–10.3)
lymph#: 1.8 10*3/uL (ref 0.9–3.3)
nRBC: 0 % (ref 0–0)

## 2009-12-09 LAB — CBC WITH DIFFERENTIAL/PLATELET
BASO%: 1.9 % (ref 0.0–2.0)
Basophils Absolute: 0.2 10*3/uL — ABNORMAL HIGH (ref 0.0–0.1)
EOS%: 2.6 % (ref 0.0–7.0)
Eosinophils Absolute: 0.2 10*3/uL (ref 0.0–0.5)
HCT: 32 % — ABNORMAL LOW (ref 34.8–46.6)
HGB: 10.6 g/dL — ABNORMAL LOW (ref 11.6–15.9)
LYMPH%: 25.8 % (ref 14.0–49.7)
MCH: 28.8 pg (ref 25.1–34.0)
MCHC: 33 g/dL (ref 31.5–36.0)
MCV: 87.4 fL (ref 79.5–101.0)
MONO#: 1.2 10*3/uL — ABNORMAL HIGH (ref 0.1–0.9)
MONO%: 14.5 % — ABNORMAL HIGH (ref 0.0–14.0)
NEUT#: 4.5 10*3/uL (ref 1.5–6.5)
NEUT%: 55.2 % (ref 38.4–76.8)
Platelets: 119 10*3/uL — ABNORMAL LOW (ref 145–400)
RBC: 3.67 10*6/uL — ABNORMAL LOW (ref 3.70–5.45)
RDW: 18 % — ABNORMAL HIGH (ref 11.2–14.5)
WBC: 8.1 10*3/uL (ref 3.9–10.3)
lymph#: 2.1 10*3/uL (ref 0.9–3.3)

## 2009-12-17 LAB — CBC WITH DIFFERENTIAL/PLATELET
BASO%: 1.9 % (ref 0.0–2.0)
Basophils Absolute: 0.1 10*3/uL (ref 0.0–0.1)
EOS%: 2.4 % (ref 0.0–7.0)
Eosinophils Absolute: 0.1 10*3/uL (ref 0.0–0.5)
HCT: 36 % (ref 34.8–46.6)
HGB: 11.6 g/dL (ref 11.6–15.9)
LYMPH%: 28.9 % (ref 14.0–49.7)
MCH: 28.2 pg (ref 25.1–34.0)
MCHC: 32.3 g/dL (ref 31.5–36.0)
MCV: 87.3 fL (ref 79.5–101.0)
MONO#: 0.9 10*3/uL (ref 0.1–0.9)
MONO%: 16.5 % — ABNORMAL HIGH (ref 0.0–14.0)
NEUT#: 2.8 10*3/uL (ref 1.5–6.5)
NEUT%: 50.3 % (ref 38.4–76.8)
Platelets: 75 10*3/uL — ABNORMAL LOW (ref 145–400)
RBC: 4.13 10*6/uL (ref 3.70–5.45)
RDW: 17 % — ABNORMAL HIGH (ref 11.2–14.5)
WBC: 5.6 10*3/uL (ref 3.9–10.3)
lymph#: 1.6 10*3/uL (ref 0.9–3.3)

## 2009-12-23 LAB — CBC WITH DIFFERENTIAL/PLATELET
BASO%: 1.5 % (ref 0.0–2.0)
Basophils Absolute: 0.1 10*3/uL (ref 0.0–0.1)
EOS%: 4.9 % (ref 0.0–7.0)
Eosinophils Absolute: 0.4 10*3/uL (ref 0.0–0.5)
HCT: 32.8 % — ABNORMAL LOW (ref 34.8–46.6)
HGB: 11 g/dL — ABNORMAL LOW (ref 11.6–15.9)
LYMPH%: 23.3 % (ref 14.0–49.7)
MCH: 29.4 pg (ref 25.1–34.0)
MCHC: 33.6 g/dL (ref 31.5–36.0)
MCV: 87.4 fL (ref 79.5–101.0)
MONO#: 1.2 10*3/uL — ABNORMAL HIGH (ref 0.1–0.9)
MONO%: 15.7 % — ABNORMAL HIGH (ref 0.0–14.0)
NEUT#: 4.3 10*3/uL (ref 1.5–6.5)
NEUT%: 54.6 % (ref 38.4–76.8)
Platelets: 43 10*3/uL — ABNORMAL LOW (ref 145–400)
RBC: 3.75 10*6/uL (ref 3.70–5.45)
RDW: 17 % — ABNORMAL HIGH (ref 11.2–14.5)
WBC: 7.9 10*3/uL (ref 3.9–10.3)
lymph#: 1.8 10*3/uL (ref 0.9–3.3)

## 2009-12-25 ENCOUNTER — Ambulatory Visit: Payer: Self-pay | Admitting: Oncology

## 2009-12-30 LAB — CBC WITH DIFFERENTIAL/PLATELET
BASO%: 1 % (ref 0.0–2.0)
Basophils Absolute: 0.1 10*3/uL (ref 0.0–0.1)
EOS%: 1.7 % (ref 0.0–7.0)
Eosinophils Absolute: 0.1 10*3/uL (ref 0.0–0.5)
HCT: 32.9 % — ABNORMAL LOW (ref 34.8–46.6)
HGB: 10.7 g/dL — ABNORMAL LOW (ref 11.6–15.9)
LYMPH%: 24.4 % (ref 14.0–49.7)
MCH: 28.1 pg (ref 25.1–34.0)
MCHC: 32.4 g/dL (ref 31.5–36.0)
MCV: 86.5 fL (ref 79.5–101.0)
MONO#: 1 10*3/uL — ABNORMAL HIGH (ref 0.1–0.9)
MONO%: 13.6 % (ref 0.0–14.0)
NEUT#: 4.2 10*3/uL (ref 1.5–6.5)
NEUT%: 59.3 % (ref 38.4–76.8)
Platelets: 149 10*3/uL (ref 145–400)
RBC: 3.81 10*6/uL (ref 3.70–5.45)
RDW: 16.8 % — ABNORMAL HIGH (ref 11.2–14.5)
WBC: 7.2 10*3/uL (ref 3.9–10.3)
lymph#: 1.7 10*3/uL (ref 0.9–3.3)

## 2010-01-06 ENCOUNTER — Ambulatory Visit: Payer: Self-pay | Admitting: Oncology

## 2010-01-06 LAB — CBC WITH DIFFERENTIAL/PLATELET
BASO%: 1.3 % (ref 0.0–2.0)
Basophils Absolute: 0.1 10*3/uL (ref 0.0–0.1)
EOS%: 1.6 % (ref 0.0–7.0)
Eosinophils Absolute: 0.1 10*3/uL (ref 0.0–0.5)
HCT: 33.9 % — ABNORMAL LOW (ref 34.8–46.6)
HGB: 11.4 g/dL — ABNORMAL LOW (ref 11.6–15.9)
LYMPH%: 28.1 % (ref 14.0–49.7)
MCH: 28.6 pg (ref 25.1–34.0)
MCHC: 33.7 g/dL (ref 31.5–36.0)
MCV: 85 fL (ref 79.5–101.0)
MONO#: 0.7 10*3/uL (ref 0.1–0.9)
MONO%: 9.5 % (ref 0.0–14.0)
NEUT#: 4.5 10*3/uL (ref 1.5–6.5)
NEUT%: 59.5 % (ref 38.4–76.8)
Platelets: 184 10*3/uL (ref 145–400)
RBC: 3.99 10*6/uL (ref 3.70–5.45)
RDW: 16 % — ABNORMAL HIGH (ref 11.2–14.5)
WBC: 7.5 10*3/uL (ref 3.9–10.3)
lymph#: 2.1 10*3/uL (ref 0.9–3.3)

## 2010-01-13 LAB — CBC WITH DIFFERENTIAL/PLATELET
BASO%: 1.1 % (ref 0.0–2.0)
Basophils Absolute: 0.1 10*3/uL (ref 0.0–0.1)
EOS%: 1 % (ref 0.0–7.0)
Eosinophils Absolute: 0.1 10*3/uL (ref 0.0–0.5)
HCT: 32.2 % — ABNORMAL LOW (ref 34.8–46.6)
HGB: 10.6 g/dL — ABNORMAL LOW (ref 11.6–15.9)
LYMPH%: 28.3 % (ref 14.0–49.7)
MCH: 27.8 pg (ref 25.1–34.0)
MCHC: 32.8 g/dL (ref 31.5–36.0)
MCV: 84.9 fL (ref 79.5–101.0)
MONO#: 1.1 10*3/uL — ABNORMAL HIGH (ref 0.1–0.9)
MONO%: 11.3 % (ref 0.0–14.0)
NEUT#: 5.6 10*3/uL (ref 1.5–6.5)
NEUT%: 58.3 % (ref 38.4–76.8)
Platelets: 109 10*3/uL — ABNORMAL LOW (ref 145–400)
RBC: 3.8 10*6/uL (ref 3.70–5.45)
RDW: 16.6 % — ABNORMAL HIGH (ref 11.2–14.5)
WBC: 9.5 10*3/uL (ref 3.9–10.3)
lymph#: 2.7 10*3/uL (ref 0.9–3.3)

## 2010-01-20 LAB — CBC WITH DIFFERENTIAL/PLATELET
BASO%: 1.1 % (ref 0.0–2.0)
Basophils Absolute: 0.1 10*3/uL (ref 0.0–0.1)
EOS%: 4.6 % (ref 0.0–7.0)
Eosinophils Absolute: 0.4 10*3/uL (ref 0.0–0.5)
HCT: 36.1 % (ref 34.8–46.6)
HGB: 11.8 g/dL (ref 11.6–15.9)
LYMPH%: 29.4 % (ref 14.0–49.7)
MCH: 26.7 pg (ref 25.1–34.0)
MCHC: 32.7 g/dL (ref 31.5–36.0)
MCV: 81.7 fL (ref 79.5–101.0)
MONO#: 1.3 10*3/uL — ABNORMAL HIGH (ref 0.1–0.9)
MONO%: 16.3 % — ABNORMAL HIGH (ref 0.0–14.0)
NEUT#: 3.9 10*3/uL (ref 1.5–6.5)
NEUT%: 48.6 % (ref 38.4–76.8)
Platelets: 11 10*3/uL — ABNORMAL LOW (ref 145–400)
RBC: 4.42 10*6/uL (ref 3.70–5.45)
RDW: 15.3 % — ABNORMAL HIGH (ref 11.2–14.5)
WBC: 8 10*3/uL (ref 3.9–10.3)
lymph#: 2.4 10*3/uL (ref 0.9–3.3)
nRBC: 0 % (ref 0–0)

## 2010-01-25 ENCOUNTER — Encounter: Payer: Self-pay | Admitting: General Surgery

## 2010-01-27 LAB — CBC WITH DIFFERENTIAL/PLATELET
BASO%: 1.2 % (ref 0.0–2.0)
Basophils Absolute: 0.1 10*3/uL (ref 0.0–0.1)
EOS%: 3.4 % (ref 0.0–7.0)
Eosinophils Absolute: 0.3 10*3/uL (ref 0.0–0.5)
HCT: 35 % (ref 34.8–46.6)
HGB: 11.5 g/dL — ABNORMAL LOW (ref 11.6–15.9)
LYMPH%: 29.1 % (ref 14.0–49.7)
MCH: 26.6 pg (ref 25.1–34.0)
MCHC: 32.9 g/dL (ref 31.5–36.0)
MCV: 81 fL (ref 79.5–101.0)
MONO#: 1 10*3/uL — ABNORMAL HIGH (ref 0.1–0.9)
MONO%: 11 % (ref 0.0–14.0)
NEUT#: 5 10*3/uL (ref 1.5–6.5)
NEUT%: 55.3 % (ref 38.4–76.8)
Platelets: 151 10*3/uL (ref 145–400)
RBC: 4.32 10*6/uL (ref 3.70–5.45)
RDW: 15.2 % — ABNORMAL HIGH (ref 11.2–14.5)
WBC: 9.1 10*3/uL (ref 3.9–10.3)
lymph#: 2.6 10*3/uL (ref 0.9–3.3)

## 2010-01-27 LAB — TECHNOLOGIST REVIEW

## 2010-02-03 LAB — CBC WITH DIFFERENTIAL/PLATELET
BASO%: 1.5 % (ref 0.0–2.0)
Basophils Absolute: 0.1 10*3/uL (ref 0.0–0.1)
EOS%: 2.8 % (ref 0.0–7.0)
Eosinophils Absolute: 0.2 10*3/uL (ref 0.0–0.5)
HCT: 32.4 % — ABNORMAL LOW (ref 34.8–46.6)
HGB: 10.7 g/dL — ABNORMAL LOW (ref 11.6–15.9)
LYMPH%: 24.9 % (ref 14.0–49.7)
MCH: 26.4 pg (ref 25.1–34.0)
MCHC: 33 g/dL (ref 31.5–36.0)
MCV: 79.8 fL (ref 79.5–101.0)
MONO#: 1.2 10*3/uL — ABNORMAL HIGH (ref 0.1–0.9)
MONO%: 16.2 % — ABNORMAL HIGH (ref 0.0–14.0)
NEUT#: 4 10*3/uL (ref 1.5–6.5)
NEUT%: 54.6 % (ref 38.4–76.8)
Platelets: 269 10*3/uL (ref 145–400)
RBC: 4.06 10*6/uL (ref 3.70–5.45)
RDW: 15.4 % — ABNORMAL HIGH (ref 11.2–14.5)
WBC: 7.2 10*3/uL (ref 3.9–10.3)
lymph#: 1.8 10*3/uL (ref 0.9–3.3)
nRBC: 0 % (ref 0–0)

## 2010-02-10 ENCOUNTER — Other Ambulatory Visit: Payer: Self-pay | Admitting: Oncology

## 2010-02-10 ENCOUNTER — Encounter (HOSPITAL_BASED_OUTPATIENT_CLINIC_OR_DEPARTMENT_OTHER): Payer: BC Managed Care – PPO | Admitting: Oncology

## 2010-02-10 DIAGNOSIS — D473 Essential (hemorrhagic) thrombocythemia: Secondary | ICD-10-CM

## 2010-02-10 DIAGNOSIS — D693 Immune thrombocytopenic purpura: Secondary | ICD-10-CM

## 2010-02-10 DIAGNOSIS — C50419 Malignant neoplasm of upper-outer quadrant of unspecified female breast: Secondary | ICD-10-CM

## 2010-02-10 LAB — CBC WITH DIFFERENTIAL/PLATELET
BASO%: 1.9 % (ref 0.0–2.0)
Basophils Absolute: 0.1 10*3/uL (ref 0.0–0.1)
EOS%: 2.6 % (ref 0.0–7.0)
Eosinophils Absolute: 0.2 10*3/uL (ref 0.0–0.5)
HCT: 34.9 % (ref 34.8–46.6)
HGB: 11.4 g/dL — ABNORMAL LOW (ref 11.6–15.9)
LYMPH%: 29.1 % (ref 14.0–49.7)
MCH: 26.3 pg (ref 25.1–34.0)
MCHC: 32.7 g/dL (ref 31.5–36.0)
MCV: 80.6 fL (ref 79.5–101.0)
MONO#: 0.9 10*3/uL (ref 0.1–0.9)
MONO%: 11.7 % (ref 0.0–14.0)
NEUT#: 4 10*3/uL (ref 1.5–6.5)
NEUT%: 54.7 % (ref 38.4–76.8)
Platelets: 70 10*3/uL — ABNORMAL LOW (ref 145–400)
RBC: 4.33 10*6/uL (ref 3.70–5.45)
RDW: 15.6 % — ABNORMAL HIGH (ref 11.2–14.5)
WBC: 7.4 10*3/uL (ref 3.9–10.3)
lymph#: 2.2 10*3/uL (ref 0.9–3.3)
nRBC: 0 % (ref 0–0)

## 2010-02-17 ENCOUNTER — Encounter (HOSPITAL_BASED_OUTPATIENT_CLINIC_OR_DEPARTMENT_OTHER): Payer: BC Managed Care – PPO | Admitting: Oncology

## 2010-02-17 ENCOUNTER — Other Ambulatory Visit: Payer: Self-pay | Admitting: Oncology

## 2010-02-17 DIAGNOSIS — D693 Immune thrombocytopenic purpura: Secondary | ICD-10-CM

## 2010-02-17 DIAGNOSIS — C50419 Malignant neoplasm of upper-outer quadrant of unspecified female breast: Secondary | ICD-10-CM

## 2010-02-17 LAB — CBC WITH DIFFERENTIAL/PLATELET
BASO%: 1.1 % (ref 0.0–2.0)
Basophils Absolute: 0.1 10*3/uL (ref 0.0–0.1)
EOS%: 1.8 % (ref 0.0–7.0)
Eosinophils Absolute: 0.2 10*3/uL (ref 0.0–0.5)
HCT: 36 % (ref 34.8–46.6)
HGB: 11.5 g/dL — ABNORMAL LOW (ref 11.6–15.9)
LYMPH%: 18.2 % (ref 14.0–49.7)
MCH: 26.9 pg (ref 25.1–34.0)
MCHC: 32.1 g/dL (ref 31.5–36.0)
MCV: 83.8 fL (ref 79.5–101.0)
MONO#: 1.2 10*3/uL — ABNORMAL HIGH (ref 0.1–0.9)
MONO%: 12.2 % (ref 0.0–14.0)
NEUT#: 6.6 10*3/uL — ABNORMAL HIGH (ref 1.5–6.5)
NEUT%: 66.7 % (ref 38.4–76.8)
Platelets: 53 10*3/uL — ABNORMAL LOW (ref 145–400)
RBC: 4.29 10*6/uL (ref 3.70–5.45)
RDW: 16.6 % — ABNORMAL HIGH (ref 11.2–14.5)
WBC: 9.8 10*3/uL (ref 3.9–10.3)
lymph#: 1.8 10*3/uL (ref 0.9–3.3)

## 2010-02-24 ENCOUNTER — Encounter (HOSPITAL_BASED_OUTPATIENT_CLINIC_OR_DEPARTMENT_OTHER): Payer: BC Managed Care – PPO | Admitting: Oncology

## 2010-02-24 ENCOUNTER — Other Ambulatory Visit: Payer: Self-pay | Admitting: Oncology

## 2010-02-24 DIAGNOSIS — C50419 Malignant neoplasm of upper-outer quadrant of unspecified female breast: Secondary | ICD-10-CM

## 2010-02-24 DIAGNOSIS — D693 Immune thrombocytopenic purpura: Secondary | ICD-10-CM

## 2010-02-24 LAB — CBC WITH DIFFERENTIAL/PLATELET
BASO%: 1.6 % (ref 0.0–2.0)
Basophils Absolute: 0.1 10e3/uL (ref 0.0–0.1)
EOS%: 4.7 % (ref 0.0–7.0)
Eosinophils Absolute: 0.4 10e3/uL (ref 0.0–0.5)
HCT: 33.8 % — ABNORMAL LOW (ref 34.8–46.6)
HGB: 11.1 g/dL — ABNORMAL LOW (ref 11.6–15.9)
LYMPH%: 25.3 % (ref 14.0–49.7)
MCH: 26.4 pg (ref 25.1–34.0)
MCHC: 32.8 g/dL (ref 31.5–36.0)
MCV: 80.5 fL (ref 79.5–101.0)
MONO#: 1.3 10e3/uL — ABNORMAL HIGH (ref 0.1–0.9)
MONO%: 16.7 % — ABNORMAL HIGH (ref 0.0–14.0)
NEUT#: 4 10e3/uL (ref 1.5–6.5)
NEUT%: 51.7 % (ref 38.4–76.8)
Platelets: 111 10e3/uL — ABNORMAL LOW (ref 145–400)
RBC: 4.2 10e6/uL (ref 3.70–5.45)
RDW: 16.4 % — ABNORMAL HIGH (ref 11.2–14.5)
WBC: 7.7 10e3/uL (ref 3.9–10.3)
lymph#: 1.9 10e3/uL (ref 0.9–3.3)
nRBC: 0 % (ref 0–0)

## 2010-03-03 ENCOUNTER — Encounter (HOSPITAL_BASED_OUTPATIENT_CLINIC_OR_DEPARTMENT_OTHER): Payer: BC Managed Care – PPO | Admitting: Oncology

## 2010-03-03 ENCOUNTER — Other Ambulatory Visit: Payer: Self-pay | Admitting: Oncology

## 2010-03-03 DIAGNOSIS — C50419 Malignant neoplasm of upper-outer quadrant of unspecified female breast: Secondary | ICD-10-CM

## 2010-03-03 DIAGNOSIS — D693 Immune thrombocytopenic purpura: Secondary | ICD-10-CM

## 2010-03-03 LAB — CBC WITH DIFFERENTIAL/PLATELET
BASO%: 1.8 % (ref 0.0–2.0)
Basophils Absolute: 0.1 10*3/uL (ref 0.0–0.1)
EOS%: 2.7 % (ref 0.0–7.0)
Eosinophils Absolute: 0.2 10*3/uL (ref 0.0–0.5)
HCT: 33.2 % — ABNORMAL LOW (ref 34.8–46.6)
HGB: 10.9 g/dL — ABNORMAL LOW (ref 11.6–15.9)
LYMPH%: 27.3 % (ref 14.0–49.7)
MCH: 26.1 pg (ref 25.1–34.0)
MCHC: 32.8 g/dL (ref 31.5–36.0)
MCV: 79.4 fL — ABNORMAL LOW (ref 79.5–101.0)
MONO#: 1.2 10*3/uL — ABNORMAL HIGH (ref 0.1–0.9)
MONO%: 16.5 % — ABNORMAL HIGH (ref 0.0–14.0)
NEUT#: 3.8 10*3/uL (ref 1.5–6.5)
NEUT%: 51.7 % (ref 38.4–76.8)
Platelets: 131 10*3/uL — ABNORMAL LOW (ref 145–400)
RBC: 4.18 10*6/uL (ref 3.70–5.45)
RDW: 16.4 % — ABNORMAL HIGH (ref 11.2–14.5)
WBC: 7.3 10*3/uL (ref 3.9–10.3)
lymph#: 2 10*3/uL (ref 0.9–3.3)
nRBC: 0 % (ref 0–0)

## 2010-03-10 ENCOUNTER — Encounter (HOSPITAL_BASED_OUTPATIENT_CLINIC_OR_DEPARTMENT_OTHER): Payer: BC Managed Care – PPO | Admitting: Oncology

## 2010-03-10 ENCOUNTER — Other Ambulatory Visit: Payer: Self-pay | Admitting: Oncology

## 2010-03-10 DIAGNOSIS — D693 Immune thrombocytopenic purpura: Secondary | ICD-10-CM

## 2010-03-10 DIAGNOSIS — C50419 Malignant neoplasm of upper-outer quadrant of unspecified female breast: Secondary | ICD-10-CM

## 2010-03-10 LAB — CBC WITH DIFFERENTIAL/PLATELET
BASO%: 2.2 % — ABNORMAL HIGH (ref 0.0–2.0)
Basophils Absolute: 0.1 10*3/uL (ref 0.0–0.1)
EOS%: 3 % (ref 0.0–7.0)
Eosinophils Absolute: 0.2 10*3/uL (ref 0.0–0.5)
HCT: 35.4 % (ref 34.8–46.6)
HGB: 11.6 g/dL (ref 11.6–15.9)
LYMPH%: 24.7 % (ref 14.0–49.7)
MCH: 26.2 pg (ref 25.1–34.0)
MCHC: 32.8 g/dL (ref 31.5–36.0)
MCV: 80.1 fL (ref 79.5–101.0)
MONO#: 0.8 10*3/uL (ref 0.1–0.9)
MONO%: 12.6 % (ref 0.0–14.0)
NEUT#: 3.7 10*3/uL (ref 1.5–6.5)
NEUT%: 57.5 % (ref 38.4–76.8)
Platelets: 108 10*3/uL — ABNORMAL LOW (ref 145–400)
RBC: 4.42 10*6/uL (ref 3.70–5.45)
RDW: 17 % — ABNORMAL HIGH (ref 11.2–14.5)
WBC: 6.4 10*3/uL (ref 3.9–10.3)
lymph#: 1.6 10*3/uL (ref 0.9–3.3)
nRBC: 0 % (ref 0–0)

## 2010-03-17 ENCOUNTER — Other Ambulatory Visit: Payer: Self-pay | Admitting: Oncology

## 2010-03-17 ENCOUNTER — Encounter (HOSPITAL_BASED_OUTPATIENT_CLINIC_OR_DEPARTMENT_OTHER): Payer: BC Managed Care – PPO | Admitting: Oncology

## 2010-03-17 DIAGNOSIS — C50419 Malignant neoplasm of upper-outer quadrant of unspecified female breast: Secondary | ICD-10-CM

## 2010-03-17 DIAGNOSIS — D693 Immune thrombocytopenic purpura: Secondary | ICD-10-CM

## 2010-03-17 LAB — CBC WITH DIFFERENTIAL/PLATELET
BASO%: 1 % (ref 0.0–2.0)
Basophils Absolute: 0.1 10*3/uL (ref 0.0–0.1)
EOS%: 1.6 % (ref 0.0–7.0)
Eosinophils Absolute: 0.2 10*3/uL (ref 0.0–0.5)
HCT: 33.9 % — ABNORMAL LOW (ref 34.8–46.6)
HGB: 11.1 g/dL — ABNORMAL LOW (ref 11.6–15.9)
LYMPH%: 19.3 % (ref 14.0–49.7)
MCH: 26.4 pg (ref 25.1–34.0)
MCHC: 32.7 g/dL (ref 31.5–36.0)
MCV: 80.7 fL (ref 79.5–101.0)
MONO#: 1.4 10*3/uL — ABNORMAL HIGH (ref 0.1–0.9)
MONO%: 15.6 % — ABNORMAL HIGH (ref 0.0–14.0)
NEUT#: 5.7 10*3/uL (ref 1.5–6.5)
NEUT%: 62.5 % (ref 38.4–76.8)
Platelets: 73 10*3/uL — ABNORMAL LOW (ref 145–400)
RBC: 4.2 10*6/uL (ref 3.70–5.45)
RDW: 17.8 % — ABNORMAL HIGH (ref 11.2–14.5)
WBC: 9.2 10*3/uL (ref 3.9–10.3)
lymph#: 1.8 10*3/uL (ref 0.9–3.3)

## 2010-03-17 LAB — DIFFERENTIAL
Basophils Absolute: 0 10*3/uL (ref 0.0–0.1)
Basophils Relative: 0 % (ref 0–1)
Eosinophils Absolute: 0.4 10*3/uL (ref 0.0–0.7)
Eosinophils Relative: 4 % (ref 0–5)
Lymphocytes Relative: 15 % (ref 12–46)
Lymphs Abs: 1.4 10*3/uL (ref 0.7–4.0)
Monocytes Absolute: 1.2 10*3/uL — ABNORMAL HIGH (ref 0.1–1.0)
Monocytes Relative: 12 % (ref 3–12)
Neutro Abs: 6.6 10*3/uL (ref 1.7–7.7)
Neutrophils Relative %: 69 % (ref 43–77)

## 2010-03-17 LAB — CBC
HCT: 22.2 % — ABNORMAL LOW (ref 36.0–46.0)
HCT: 23.1 % — ABNORMAL LOW (ref 36.0–46.0)
HCT: 23.2 % — ABNORMAL LOW (ref 36.0–46.0)
HCT: 25.9 % — ABNORMAL LOW (ref 36.0–46.0)
HCT: 29.3 % — ABNORMAL LOW (ref 36.0–46.0)
Hemoglobin: 7.5 g/dL — ABNORMAL LOW (ref 12.0–15.0)
Hemoglobin: 7.6 g/dL — ABNORMAL LOW (ref 12.0–15.0)
Hemoglobin: 7.6 g/dL — ABNORMAL LOW (ref 12.0–15.0)
Hemoglobin: 8.7 g/dL — ABNORMAL LOW (ref 12.0–15.0)
Hemoglobin: 9.6 g/dL — ABNORMAL LOW (ref 12.0–15.0)
MCH: 28.3 pg (ref 26.0–34.0)
MCH: 28.4 pg (ref 26.0–34.0)
MCH: 28.5 pg (ref 26.0–34.0)
MCH: 28.9 pg (ref 26.0–34.0)
MCH: 29.2 pg (ref 26.0–34.0)
MCHC: 32.7 g/dL (ref 30.0–36.0)
MCHC: 32.8 g/dL (ref 30.0–36.0)
MCHC: 33 g/dL (ref 30.0–36.0)
MCHC: 33.5 g/dL (ref 30.0–36.0)
MCHC: 33.6 g/dL (ref 30.0–36.0)
MCV: 85.1 fL (ref 78.0–100.0)
MCV: 85.8 fL (ref 78.0–100.0)
MCV: 86.1 fL (ref 78.0–100.0)
MCV: 86.2 fL (ref 78.0–100.0)
MCV: 89.2 fL (ref 78.0–100.0)
Platelets: 122 10*3/uL — ABNORMAL LOW (ref 150–400)
Platelets: 125 10*3/uL — ABNORMAL LOW (ref 150–400)
Platelets: 132 10*3/uL — ABNORMAL LOW (ref 150–400)
Platelets: 161 10*3/uL (ref 150–400)
Platelets: 87 10*3/uL — ABNORMAL LOW (ref 150–400)
RBC: 2.59 MIL/uL — ABNORMAL LOW (ref 3.87–5.11)
RBC: 2.68 MIL/uL — ABNORMAL LOW (ref 3.87–5.11)
RBC: 2.7 MIL/uL — ABNORMAL LOW (ref 3.87–5.11)
RBC: 3.04 MIL/uL — ABNORMAL LOW (ref 3.87–5.11)
RBC: 3.29 MIL/uL — ABNORMAL LOW (ref 3.87–5.11)
RDW: 18.9 % — ABNORMAL HIGH (ref 11.5–15.5)
RDW: 19.2 % — ABNORMAL HIGH (ref 11.5–15.5)
RDW: 19.3 % — ABNORMAL HIGH (ref 11.5–15.5)
RDW: 19.3 % — ABNORMAL HIGH (ref 11.5–15.5)
RDW: 22.7 % — ABNORMAL HIGH (ref 11.5–15.5)
WBC: 10.1 10*3/uL (ref 4.0–10.5)
WBC: 10.3 10*3/uL (ref 4.0–10.5)
WBC: 11.1 10*3/uL — ABNORMAL HIGH (ref 4.0–10.5)
WBC: 13.3 10*3/uL — ABNORMAL HIGH (ref 4.0–10.5)
WBC: 9.6 10*3/uL (ref 4.0–10.5)

## 2010-03-17 LAB — HEMOGLOBIN AND HEMATOCRIT, BLOOD
HCT: 29.1 % — ABNORMAL LOW (ref 36.0–46.0)
Hemoglobin: 9.7 g/dL — ABNORMAL LOW (ref 12.0–15.0)

## 2010-03-17 LAB — CROSSMATCH
ABO/RH(D): A POS
Antibody Screen: NEGATIVE
Unit division: 0
Unit division: 0

## 2010-03-17 LAB — BASIC METABOLIC PANEL WITH GFR
BUN: 2 mg/dL — ABNORMAL LOW (ref 6–23)
CO2: 28 meq/L (ref 19–32)
Calcium: 8.6 mg/dL (ref 8.4–10.5)
Chloride: 112 meq/L (ref 96–112)
Creatinine, Ser: 0.7 mg/dL (ref 0.4–1.2)
GFR calc non Af Amer: >60 mL/min
Glucose, Bld: 112 mg/dL — ABNORMAL HIGH (ref 70–99)
Potassium: 4.4 meq/L (ref 3.5–5.1)
Sodium: 144 meq/L (ref 135–145)

## 2010-03-17 LAB — AMYLASE
Amylase: 54 U/L (ref 0–105)
Amylase: 60 U/L (ref 0–105)

## 2010-03-17 LAB — LIPASE, BLOOD: Lipase: 27 U/L (ref 11–59)

## 2010-03-17 LAB — TECHNOLOGIST REVIEW

## 2010-03-18 LAB — PROTIME-INR
INR: 1 (ref 0.00–1.49)
Prothrombin Time: 13.4 seconds (ref 11.6–15.2)

## 2010-03-18 LAB — CBC
HCT: 34 % — ABNORMAL LOW (ref 36.0–46.0)
Hemoglobin: 11.3 g/dL — ABNORMAL LOW (ref 12.0–15.0)
MCH: 28.1 pg (ref 26.0–34.0)
MCHC: 33.2 g/dL (ref 30.0–36.0)
MCV: 84.7 fL (ref 78.0–100.0)
Platelets: 545 10*3/uL — ABNORMAL HIGH (ref 150–400)
RBC: 4.02 MIL/uL (ref 3.87–5.11)
RDW: 18.9 % — ABNORMAL HIGH (ref 11.5–15.5)
WBC: 4.2 10*3/uL (ref 4.0–10.5)

## 2010-03-18 LAB — ABO/RH: ABO/RH(D): A POS

## 2010-03-18 LAB — URINALYSIS, ROUTINE W REFLEX MICROSCOPIC
Bilirubin Urine: NEGATIVE
Glucose, UA: NEGATIVE mg/dL
Ketones, ur: NEGATIVE mg/dL
Leukocytes, UA: NEGATIVE
Nitrite: NEGATIVE
Protein, ur: NEGATIVE mg/dL
Specific Gravity, Urine: 1.023 (ref 1.005–1.030)
Urobilinogen, UA: 0.2 mg/dL (ref 0.0–1.0)
pH: 6 (ref 5.0–8.0)

## 2010-03-18 LAB — APTT: aPTT: 33 seconds (ref 24–37)

## 2010-03-18 LAB — SURGICAL PCR SCREEN
MRSA, PCR: NEGATIVE
Staphylococcus aureus: POSITIVE — AB

## 2010-03-18 LAB — COMPREHENSIVE METABOLIC PANEL
ALT: 22 U/L (ref 0–35)
AST: 31 U/L (ref 0–37)
Albumin: 3.4 g/dL — ABNORMAL LOW (ref 3.5–5.2)
Alkaline Phosphatase: 49 U/L (ref 39–117)
BUN: 7 mg/dL (ref 6–23)
CO2: 29 mEq/L (ref 19–32)
Calcium: 8.9 mg/dL (ref 8.4–10.5)
Chloride: 107 mEq/L (ref 96–112)
Creatinine, Ser: 0.68 mg/dL (ref 0.4–1.2)
GFR calc Af Amer: >60 mL/min (ref >60)
GFR calc non Af Amer: >60 mL/min (ref >60)
Glucose, Bld: 65 mg/dL — ABNORMAL LOW (ref 70–99)
Potassium: 4.1 mEq/L (ref 3.5–5.1)
Sodium: 141 mEq/L (ref 135–145)
Total Bilirubin: 0.7 mg/dL (ref 0.3–1.2)
Total Protein: 6.6 g/dL (ref 6.0–8.3)

## 2010-03-18 LAB — DIFFERENTIAL
Basophils Absolute: 0.1 10*3/uL (ref 0.0–0.1)
Basophils Relative: 2 % — ABNORMAL HIGH (ref 0–1)
Eosinophils Absolute: 0.1 10*3/uL (ref 0.0–0.7)
Eosinophils Relative: 2 % (ref 0–5)
Lymphocytes Relative: 30 % (ref 12–46)
Lymphs Abs: 1.3 10*3/uL (ref 0.7–4.0)
Monocytes Absolute: 0.4 10*3/uL (ref 0.1–1.0)
Monocytes Relative: 10 % (ref 3–12)
Neutro Abs: 2.4 10*3/uL (ref 1.7–7.7)
Neutrophils Relative %: 57 % (ref 43–77)

## 2010-03-18 LAB — URINE MICROSCOPIC-ADD ON

## 2010-03-18 LAB — PREGNANCY, URINE: Preg Test, Ur: NEGATIVE

## 2010-03-19 LAB — DIFFERENTIAL
Basophils Absolute: 0 10*3/uL (ref 0.0–0.1)
Basophils Absolute: 0.1 10*3/uL (ref 0.0–0.1)
Basophils Relative: 0 % (ref 0–1)
Basophils Relative: 2 % — ABNORMAL HIGH (ref 0–1)
Eosinophils Absolute: 0 10*3/uL (ref 0.0–0.7)
Eosinophils Absolute: 0.2 10*3/uL (ref 0.0–0.7)
Eosinophils Relative: 0 % (ref 0–5)
Eosinophils Relative: 4 % (ref 0–5)
Lymphocytes Relative: 27 % (ref 12–46)
Lymphocytes Relative: 4 % — ABNORMAL LOW (ref 12–46)
Lymphs Abs: 0.6 10*3/uL — ABNORMAL LOW (ref 0.7–4.0)
Lymphs Abs: 1.4 10*3/uL (ref 0.7–4.0)
Monocytes Absolute: 0.3 10*3/uL (ref 0.1–1.0)
Monocytes Absolute: 0.9 10*3/uL (ref 0.1–1.0)
Monocytes Relative: 17 % — ABNORMAL HIGH (ref 3–12)
Monocytes Relative: 3 % (ref 3–12)
Neutro Abs: 12.7 10*3/uL — ABNORMAL HIGH (ref 1.7–7.7)
Neutro Abs: 2.7 10*3/uL (ref 1.7–7.7)
Neutrophils Relative %: 50 % (ref 43–77)
Neutrophils Relative %: 93 % — ABNORMAL HIGH (ref 43–77)

## 2010-03-19 LAB — COMPREHENSIVE METABOLIC PANEL
ALT: 20 U/L (ref 0–35)
ALT: 23 U/L (ref 0–35)
AST: 20 U/L (ref 0–37)
AST: 21 U/L (ref 0–37)
Albumin: 3 g/dL — ABNORMAL LOW (ref 3.5–5.2)
Albumin: 3.1 g/dL — ABNORMAL LOW (ref 3.5–5.2)
Alkaline Phosphatase: 41 U/L (ref 39–117)
Alkaline Phosphatase: 42 U/L (ref 39–117)
BUN: 13 mg/dL (ref 6–23)
BUN: 8 mg/dL (ref 6–23)
CO2: 24 mEq/L (ref 19–32)
CO2: 28 mEq/L (ref 19–32)
Calcium: 8.5 mg/dL (ref 8.4–10.5)
Calcium: 8.9 mg/dL (ref 8.4–10.5)
Chloride: 108 mEq/L (ref 96–112)
Chloride: 109 mEq/L (ref 96–112)
Creatinine, Ser: 0.62 mg/dL (ref 0.4–1.2)
Creatinine, Ser: 0.67 mg/dL (ref 0.4–1.2)
GFR calc Af Amer: >60 mL/min (ref >60)
GFR calc Af Amer: >60 mL/min (ref >60)
GFR calc non Af Amer: >60 mL/min (ref >60)
GFR calc non Af Amer: >60 mL/min (ref >60)
Glucose, Bld: 135 mg/dL — ABNORMAL HIGH (ref 70–99)
Glucose, Bld: 82 mg/dL (ref 70–99)
Potassium: 3.8 mEq/L (ref 3.5–5.1)
Potassium: 3.9 mEq/L (ref 3.5–5.1)
Sodium: 137 mEq/L (ref 135–145)
Sodium: 139 mEq/L (ref 135–145)
Total Bilirubin: 0.6 mg/dL (ref 0.3–1.2)
Total Bilirubin: 0.9 mg/dL (ref 0.3–1.2)
Total Protein: 6.9 g/dL (ref 6.0–8.3)
Total Protein: 8.8 g/dL — ABNORMAL HIGH (ref 6.0–8.3)

## 2010-03-19 LAB — CBC
HCT: 28.8 % — ABNORMAL LOW (ref 36.0–46.0)
HCT: 29.2 % — ABNORMAL LOW (ref 36.0–46.0)
Hemoglobin: 9.8 g/dL — ABNORMAL LOW (ref 12.0–15.0)
Hemoglobin: 9.9 g/dL — ABNORMAL LOW (ref 12.0–15.0)
MCH: 28.4 pg (ref 26.0–34.0)
MCH: 29.9 pg (ref 26.0–34.0)
MCHC: 33.6 g/dL (ref 30.0–36.0)
MCHC: 34.5 g/dL (ref 30.0–36.0)
MCV: 84.4 fL (ref 78.0–100.0)
MCV: 86.9 fL (ref 78.0–100.0)
Platelets: 44 10*3/uL — ABNORMAL LOW (ref 150–400)
Platelets: <5 10*3/uL — CL (ref 150–400)
RBC: 3.31 MIL/uL — ABNORMAL LOW (ref 3.87–5.11)
RBC: 3.46 MIL/uL — ABNORMAL LOW (ref 3.87–5.11)
RDW: 15.4 % (ref 11.5–15.5)
RDW: 16 % — ABNORMAL HIGH (ref 11.5–15.5)
WBC: 13.6 10*3/uL — ABNORMAL HIGH (ref 4.0–10.5)
WBC: 5.3 10*3/uL (ref 4.0–10.5)

## 2010-03-24 ENCOUNTER — Encounter (HOSPITAL_BASED_OUTPATIENT_CLINIC_OR_DEPARTMENT_OTHER): Payer: BC Managed Care – PPO | Admitting: Oncology

## 2010-03-24 ENCOUNTER — Other Ambulatory Visit: Payer: Self-pay | Admitting: Oncology

## 2010-03-24 DIAGNOSIS — D693 Immune thrombocytopenic purpura: Secondary | ICD-10-CM

## 2010-03-24 DIAGNOSIS — C50419 Malignant neoplasm of upper-outer quadrant of unspecified female breast: Secondary | ICD-10-CM

## 2010-03-24 LAB — CBC WITH DIFFERENTIAL/PLATELET
BASO%: 1.5 % (ref 0.0–2.0)
Basophils Absolute: 0.1 10*3/uL (ref 0.0–0.1)
EOS%: 2.1 % (ref 0.0–7.0)
Eosinophils Absolute: 0.2 10*3/uL (ref 0.0–0.5)
HCT: 33.2 % — ABNORMAL LOW (ref 34.8–46.6)
HGB: 10.9 g/dL — ABNORMAL LOW (ref 11.6–15.9)
LYMPH%: 27.1 % (ref 14.0–49.7)
MCH: 26.5 pg (ref 25.1–34.0)
MCHC: 32.8 g/dL (ref 31.5–36.0)
MCV: 80.6 fL (ref 79.5–101.0)
MONO#: 1 10*3/uL — ABNORMAL HIGH (ref 0.1–0.9)
MONO%: 12 % (ref 0.0–14.0)
NEUT#: 4.8 10*3/uL (ref 1.5–6.5)
NEUT%: 57.3 % (ref 38.4–76.8)
Platelets: 74 10*3/uL — ABNORMAL LOW (ref 145–400)
RBC: 4.12 10*6/uL (ref 3.70–5.45)
RDW: 17.5 % — ABNORMAL HIGH (ref 11.2–14.5)
WBC: 8.4 10*3/uL (ref 3.9–10.3)
lymph#: 2.3 10*3/uL (ref 0.9–3.3)
nRBC: 0 % (ref 0–0)

## 2010-03-31 ENCOUNTER — Encounter (HOSPITAL_BASED_OUTPATIENT_CLINIC_OR_DEPARTMENT_OTHER): Payer: BC Managed Care – PPO | Admitting: Oncology

## 2010-03-31 ENCOUNTER — Other Ambulatory Visit: Payer: Self-pay | Admitting: Oncology

## 2010-03-31 DIAGNOSIS — C50419 Malignant neoplasm of upper-outer quadrant of unspecified female breast: Secondary | ICD-10-CM

## 2010-03-31 DIAGNOSIS — D693 Immune thrombocytopenic purpura: Secondary | ICD-10-CM

## 2010-03-31 LAB — CBC WITH DIFFERENTIAL/PLATELET
BASO%: 1 % (ref 0.0–2.0)
Basophils Absolute: 0.1 10*3/uL (ref 0.0–0.1)
EOS%: 2.1 % (ref 0.0–7.0)
Eosinophils Absolute: 0.2 10*3/uL (ref 0.0–0.5)
HCT: 34.1 % — ABNORMAL LOW (ref 34.8–46.6)
HGB: 11.2 g/dL — ABNORMAL LOW (ref 11.6–15.9)
LYMPH%: 30.2 % (ref 14.0–49.7)
MCH: 26.7 pg (ref 25.1–34.0)
MCHC: 32.8 g/dL (ref 31.5–36.0)
MCV: 81.4 fL (ref 79.5–101.0)
MONO#: 0.8 10*3/uL (ref 0.1–0.9)
MONO%: 10.2 % (ref 0.0–14.0)
NEUT#: 4.3 10*3/uL (ref 1.5–6.5)
NEUT%: 56.5 % (ref 38.4–76.8)
Platelets: 103 10*3/uL — ABNORMAL LOW (ref 145–400)
RBC: 4.19 10*6/uL (ref 3.70–5.45)
RDW: 17.6 % — ABNORMAL HIGH (ref 11.2–14.5)
WBC: 7.7 10*3/uL (ref 3.9–10.3)
lymph#: 2.3 10*3/uL (ref 0.9–3.3)
nRBC: 0 % (ref 0–0)

## 2010-04-07 ENCOUNTER — Encounter (HOSPITAL_BASED_OUTPATIENT_CLINIC_OR_DEPARTMENT_OTHER): Payer: BC Managed Care – PPO | Admitting: Oncology

## 2010-04-07 ENCOUNTER — Other Ambulatory Visit: Payer: Self-pay | Admitting: Oncology

## 2010-04-07 DIAGNOSIS — C50419 Malignant neoplasm of upper-outer quadrant of unspecified female breast: Secondary | ICD-10-CM

## 2010-04-07 DIAGNOSIS — D693 Immune thrombocytopenic purpura: Secondary | ICD-10-CM

## 2010-04-07 LAB — CBC WITH DIFFERENTIAL/PLATELET
BASO%: 1.6 % (ref 0.0–2.0)
Basophils Absolute: 0.1 10*3/uL (ref 0.0–0.1)
EOS%: 2.4 % (ref 0.0–7.0)
Eosinophils Absolute: 0.2 10*3/uL (ref 0.0–0.5)
HCT: 33.1 % — ABNORMAL LOW (ref 34.8–46.6)
HGB: 10.8 g/dL — ABNORMAL LOW (ref 11.6–15.9)
LYMPH%: 32.5 % (ref 14.0–49.7)
MCH: 26.8 pg (ref 25.1–34.0)
MCHC: 32.6 g/dL (ref 31.5–36.0)
MCV: 82.1 fL (ref 79.5–101.0)
MONO#: 0.7 10*3/uL (ref 0.1–0.9)
MONO%: 10.6 % (ref 0.0–14.0)
NEUT#: 3.7 10*3/uL (ref 1.5–6.5)
NEUT%: 52.9 % (ref 38.4–76.8)
Platelets: 73 10*3/uL — ABNORMAL LOW (ref 145–400)
RBC: 4.03 10*6/uL (ref 3.70–5.45)
RDW: 17.3 % — ABNORMAL HIGH (ref 11.2–14.5)
WBC: 7 10*3/uL (ref 3.9–10.3)
lymph#: 2.3 10*3/uL (ref 0.9–3.3)
nRBC: 0 % (ref 0–0)

## 2010-04-13 LAB — CBC
HCT: 36.2 % (ref 36.0–46.0)
Hemoglobin: 12.1 g/dL (ref 12.0–15.0)
MCHC: 33.4 g/dL (ref 30.0–36.0)
MCV: 88.2 fL (ref 78.0–100.0)
Platelets: 94 10*3/uL — ABNORMAL LOW (ref 150–400)
RBC: 4.11 MIL/uL (ref 3.87–5.11)
RDW: 14.6 % (ref 11.5–15.5)
WBC: 17.6 10*3/uL — ABNORMAL HIGH (ref 4.0–10.5)

## 2010-04-13 LAB — DIFFERENTIAL
Basophils Absolute: 0 10*3/uL (ref 0.0–0.1)
Basophils Relative: 0 % (ref 0–1)
Eosinophils Absolute: 0.1 10*3/uL (ref 0.0–0.7)
Eosinophils Relative: 0 % (ref 0–5)
Lymphocytes Relative: 4 % — ABNORMAL LOW (ref 12–46)
Lymphs Abs: 0.7 10*3/uL (ref 0.7–4.0)
Monocytes Absolute: 0.7 10*3/uL (ref 0.1–1.0)
Monocytes Relative: 4 % (ref 3–12)
Neutro Abs: 16.1 10*3/uL — ABNORMAL HIGH (ref 1.7–7.7)
Neutrophils Relative %: 91 % — ABNORMAL HIGH (ref 43–77)

## 2010-04-13 LAB — POCT I-STAT, CHEM 8
BUN: 11 mg/dL (ref 6–23)
Calcium, Ion: 1.13 mmol/L (ref 1.12–1.32)
Chloride: 108 mEq/L (ref 96–112)
Creatinine, Ser: 0.9 mg/dL (ref 0.4–1.2)
Glucose, Bld: 100 mg/dL — ABNORMAL HIGH (ref 70–99)
HCT: 37 % (ref 36.0–46.0)
Hemoglobin: 12.6 g/dL (ref 12.0–15.0)
Potassium: 3.8 mEq/L (ref 3.5–5.1)
Sodium: 140 mEq/L (ref 135–145)
TCO2: 21 mmol/L (ref 0–100)

## 2010-04-14 ENCOUNTER — Other Ambulatory Visit: Payer: Self-pay | Admitting: Oncology

## 2010-04-14 ENCOUNTER — Encounter (HOSPITAL_BASED_OUTPATIENT_CLINIC_OR_DEPARTMENT_OTHER): Payer: BC Managed Care – PPO | Admitting: Oncology

## 2010-04-14 DIAGNOSIS — D693 Immune thrombocytopenic purpura: Secondary | ICD-10-CM

## 2010-04-14 DIAGNOSIS — C50419 Malignant neoplasm of upper-outer quadrant of unspecified female breast: Secondary | ICD-10-CM

## 2010-04-14 LAB — CBC WITH DIFFERENTIAL/PLATELET
BASO%: 2.8 % — ABNORMAL HIGH (ref 0.0–2.0)
Basophils Absolute: 0.2 10*3/uL — ABNORMAL HIGH (ref 0.0–0.1)
EOS%: 4 % (ref 0.0–7.0)
Eosinophils Absolute: 0.3 10*3/uL (ref 0.0–0.5)
HCT: 35.3 % (ref 34.8–46.6)
HGB: 11.6 g/dL (ref 11.6–15.9)
LYMPH%: 31.2 % (ref 14.0–49.7)
MCH: 27.4 pg (ref 25.1–34.0)
MCHC: 32.9 g/dL (ref 31.5–36.0)
MCV: 83.5 fL (ref 79.5–101.0)
MONO#: 0.8 10*3/uL (ref 0.1–0.9)
MONO%: 12.4 % (ref 0.0–14.0)
NEUT#: 3.3 10*3/uL (ref 1.5–6.5)
NEUT%: 49.6 % (ref 38.4–76.8)
Platelets: 72 10*3/uL — ABNORMAL LOW (ref 145–400)
RBC: 4.23 10*6/uL (ref 3.70–5.45)
RDW: 17.1 % — ABNORMAL HIGH (ref 11.2–14.5)
WBC: 6.7 10*3/uL (ref 3.9–10.3)
lymph#: 2.1 10*3/uL (ref 0.9–3.3)
nRBC: 0 % (ref 0–0)

## 2010-04-21 ENCOUNTER — Other Ambulatory Visit: Payer: Self-pay | Admitting: Oncology

## 2010-04-21 ENCOUNTER — Encounter (HOSPITAL_BASED_OUTPATIENT_CLINIC_OR_DEPARTMENT_OTHER): Payer: BC Managed Care – PPO | Admitting: Oncology

## 2010-04-21 DIAGNOSIS — D693 Immune thrombocytopenic purpura: Secondary | ICD-10-CM

## 2010-04-21 DIAGNOSIS — C50419 Malignant neoplasm of upper-outer quadrant of unspecified female breast: Secondary | ICD-10-CM

## 2010-04-21 LAB — CBC WITH DIFFERENTIAL/PLATELET
BASO%: 1.8 % (ref 0.0–2.0)
Basophils Absolute: 0.1 10*3/uL (ref 0.0–0.1)
EOS%: 3.4 % (ref 0.0–7.0)
Eosinophils Absolute: 0.3 10*3/uL (ref 0.0–0.5)
HCT: 34.6 % — ABNORMAL LOW (ref 34.8–46.6)
HGB: 11.4 g/dL — ABNORMAL LOW (ref 11.6–15.9)
LYMPH%: 37 % (ref 14.0–49.7)
MCH: 27.3 pg (ref 25.1–34.0)
MCHC: 32.9 g/dL (ref 31.5–36.0)
MCV: 83 fL (ref 79.5–101.0)
MONO#: 0.9 10*3/uL (ref 0.1–0.9)
MONO%: 11.7 % (ref 0.0–14.0)
NEUT#: 3.4 10*3/uL (ref 1.5–6.5)
NEUT%: 46.1 % (ref 38.4–76.8)
Platelets: 91 10*3/uL — ABNORMAL LOW (ref 145–400)
RBC: 4.17 10*6/uL (ref 3.70–5.45)
RDW: 16.8 % — ABNORMAL HIGH (ref 11.2–14.5)
WBC: 7.3 10*3/uL (ref 3.9–10.3)
lymph#: 2.7 10*3/uL (ref 0.9–3.3)
nRBC: 0 % (ref 0–0)

## 2010-04-21 LAB — TECHNOLOGIST REVIEW

## 2010-04-28 ENCOUNTER — Encounter (HOSPITAL_BASED_OUTPATIENT_CLINIC_OR_DEPARTMENT_OTHER): Payer: BC Managed Care – PPO | Admitting: Oncology

## 2010-04-28 ENCOUNTER — Other Ambulatory Visit: Payer: Self-pay | Admitting: Oncology

## 2010-04-28 DIAGNOSIS — C50419 Malignant neoplasm of upper-outer quadrant of unspecified female breast: Secondary | ICD-10-CM

## 2010-04-28 DIAGNOSIS — D693 Immune thrombocytopenic purpura: Secondary | ICD-10-CM

## 2010-04-28 LAB — CBC WITH DIFFERENTIAL/PLATELET
BASO%: 1.6 % (ref 0.0–2.0)
Basophils Absolute: 0.1 10*3/uL (ref 0.0–0.1)
EOS%: 3.2 % (ref 0.0–7.0)
Eosinophils Absolute: 0.2 10*3/uL (ref 0.0–0.5)
HCT: 35.6 % (ref 34.8–46.6)
HGB: 11.5 g/dL — ABNORMAL LOW (ref 11.6–15.9)
LYMPH%: 37.2 % (ref 14.0–49.7)
MCH: 28 pg (ref 25.1–34.0)
MCHC: 32.3 g/dL (ref 31.5–36.0)
MCV: 86.5 fL (ref 79.5–101.0)
MONO#: 1.1 10*3/uL — ABNORMAL HIGH (ref 0.1–0.9)
MONO%: 14.7 % — ABNORMAL HIGH (ref 0.0–14.0)
NEUT#: 3.2 10*3/uL (ref 1.5–6.5)
NEUT%: 43.3 % (ref 38.4–76.8)
Platelets: 74 10*3/uL — ABNORMAL LOW (ref 145–400)
RBC: 4.11 10*6/uL (ref 3.70–5.45)
RDW: 16.9 % — ABNORMAL HIGH (ref 11.2–14.5)
WBC: 7.3 10*3/uL (ref 3.9–10.3)
lymph#: 2.7 10*3/uL (ref 0.9–3.3)

## 2010-05-05 ENCOUNTER — Other Ambulatory Visit: Payer: Self-pay | Admitting: Oncology

## 2010-05-05 ENCOUNTER — Encounter (HOSPITAL_BASED_OUTPATIENT_CLINIC_OR_DEPARTMENT_OTHER): Payer: BC Managed Care – PPO | Admitting: Oncology

## 2010-05-05 DIAGNOSIS — D693 Immune thrombocytopenic purpura: Secondary | ICD-10-CM

## 2010-05-05 DIAGNOSIS — D473 Essential (hemorrhagic) thrombocythemia: Secondary | ICD-10-CM

## 2010-05-05 LAB — CBC WITH DIFFERENTIAL/PLATELET
BASO%: 1.2 % (ref 0.0–2.0)
Basophils Absolute: 0.1 10*3/uL (ref 0.0–0.1)
EOS%: 2.2 % (ref 0.0–7.0)
Eosinophils Absolute: 0.1 10*3/uL (ref 0.0–0.5)
HCT: 33.9 % — ABNORMAL LOW (ref 34.8–46.6)
HGB: 11.1 g/dL — ABNORMAL LOW (ref 11.6–15.9)
LYMPH%: 35.7 % (ref 14.0–49.7)
MCH: 28.1 pg (ref 25.1–34.0)
MCHC: 32.7 g/dL (ref 31.5–36.0)
MCV: 86.2 fL (ref 79.5–101.0)
MONO#: 0.9 10*3/uL (ref 0.1–0.9)
MONO%: 14.7 % — ABNORMAL HIGH (ref 0.0–14.0)
NEUT#: 2.9 10*3/uL (ref 1.5–6.5)
NEUT%: 46.2 % (ref 38.4–76.8)
Platelets: 51 10*3/uL — ABNORMAL LOW (ref 145–400)
RBC: 3.93 10*6/uL (ref 3.70–5.45)
RDW: 16.8 % — ABNORMAL HIGH (ref 11.2–14.5)
WBC: 6.3 10*3/uL (ref 3.9–10.3)
lymph#: 2.3 10*3/uL (ref 0.9–3.3)

## 2010-05-12 ENCOUNTER — Other Ambulatory Visit: Payer: Self-pay | Admitting: Oncology

## 2010-05-12 ENCOUNTER — Encounter (HOSPITAL_BASED_OUTPATIENT_CLINIC_OR_DEPARTMENT_OTHER): Payer: BC Managed Care – PPO | Admitting: Oncology

## 2010-05-12 DIAGNOSIS — C50419 Malignant neoplasm of upper-outer quadrant of unspecified female breast: Secondary | ICD-10-CM

## 2010-05-12 DIAGNOSIS — D693 Immune thrombocytopenic purpura: Secondary | ICD-10-CM

## 2010-05-12 LAB — CBC WITH DIFFERENTIAL/PLATELET
BASO%: 1.7 % (ref 0.0–2.0)
Basophils Absolute: 0.1 10*3/uL (ref 0.0–0.1)
EOS%: 3.2 % (ref 0.0–7.0)
Eosinophils Absolute: 0.3 10*3/uL (ref 0.0–0.5)
HCT: 34.4 % — ABNORMAL LOW (ref 34.8–46.6)
HGB: 11.4 g/dL — ABNORMAL LOW (ref 11.6–15.9)
LYMPH%: 30.8 % (ref 14.0–49.7)
MCH: 27.1 pg (ref 25.1–34.0)
MCHC: 33.1 g/dL (ref 31.5–36.0)
MCV: 81.7 fL (ref 79.5–101.0)
MONO#: 0.8 10*3/uL (ref 0.1–0.9)
MONO%: 10.2 % (ref 0.0–14.0)
NEUT#: 4.2 10*3/uL (ref 1.5–6.5)
NEUT%: 54.1 % (ref 38.4–76.8)
Platelets: 75 10*3/uL — ABNORMAL LOW (ref 145–400)
RBC: 4.21 10*6/uL (ref 3.70–5.45)
RDW: 15.8 % — ABNORMAL HIGH (ref 11.2–14.5)
WBC: 7.8 10*3/uL (ref 3.9–10.3)
lymph#: 2.4 10*3/uL (ref 0.9–3.3)
nRBC: 0 % (ref 0–0)

## 2010-05-19 ENCOUNTER — Encounter (HOSPITAL_BASED_OUTPATIENT_CLINIC_OR_DEPARTMENT_OTHER): Payer: BC Managed Care – PPO | Admitting: Oncology

## 2010-05-19 ENCOUNTER — Other Ambulatory Visit: Payer: Self-pay | Admitting: Oncology

## 2010-05-19 DIAGNOSIS — C50419 Malignant neoplasm of upper-outer quadrant of unspecified female breast: Secondary | ICD-10-CM

## 2010-05-19 DIAGNOSIS — D693 Immune thrombocytopenic purpura: Secondary | ICD-10-CM

## 2010-05-19 LAB — CBC WITH DIFFERENTIAL/PLATELET
BASO%: 1.4 % (ref 0.0–2.0)
Basophils Absolute: 0.1 10*3/uL (ref 0.0–0.1)
EOS%: 2.7 % (ref 0.0–7.0)
Eosinophils Absolute: 0.2 10*3/uL (ref 0.0–0.5)
HCT: 33.5 % — ABNORMAL LOW (ref 34.8–46.6)
HGB: 11 g/dL — ABNORMAL LOW (ref 11.6–15.9)
LYMPH%: 24.3 % (ref 14.0–49.7)
MCH: 26.8 pg (ref 25.1–34.0)
MCHC: 32.8 g/dL (ref 31.5–36.0)
MCV: 81.7 fL (ref 79.5–101.0)
MONO#: 1.1 10*3/uL — ABNORMAL HIGH (ref 0.1–0.9)
MONO%: 13.4 % (ref 0.0–14.0)
NEUT#: 4.7 10*3/uL (ref 1.5–6.5)
NEUT%: 58.2 % (ref 38.4–76.8)
Platelets: 79 10*3/uL — ABNORMAL LOW (ref 145–400)
RBC: 4.1 10*6/uL (ref 3.70–5.45)
RDW: 15.7 % — ABNORMAL HIGH (ref 11.2–14.5)
WBC: 8 10*3/uL (ref 3.9–10.3)
lymph#: 2 10*3/uL (ref 0.9–3.3)
nRBC: 0 % (ref 0–0)

## 2010-05-19 NOTE — Op Note (Signed)
NAME:  Yvonne Hoover, Yvonne Hoover                  ACCOUNT NO.:  [id number]   MEDICAL RECORD NO.:  [id number]          PATIENT TYPE:  AMB   LOCATION:  SDS                          FACILITY:  [facility]   PHYSICIAN:  Angelia Mould. Derrell Lolling, M.D.DATE OF BIRTH:  1960/09/16   DATE OF PROCEDURE:  DATE OF DISCHARGE:                               OPERATIVE REPORT   PREOPERATIVE DIAGNOSIS:  Invasive mammary carcinoma, right breast.   POSTOPERATIVE DIAGNOSIS:  Invasive mammary carcinoma, right breast.   OPERATION PERFORMED:  1. Injection of blue dye, right breast.  2. Right partial mastectomy with needle localization and specimen      mammogram.  3. Excision, additional lateral margin, right breast lumpectomy site.  4. Right sentinel lymph node biopsy.   SURGEON:  Angelia Mould. Derrell Lolling, M.D.   OPERATIVE INDICATIONS:  This is a 50 year old white female with  refractory idiopathic thrombocytopenic purpura.  She was recently found  to have a palpable mass in the right breast at the areolar margin at  about the 8:30 position.  She has had an ultrasound which showed minimal  findings.  Her mammogram showed nothing.  A breast-specific gamma  imaging showed a very bright area of uptake in the right retroareolar  area which was very anterior.  An image-guided biopsy was then done  revealing invasive mammary carcinoma which was receptor positive.  FISH  is pending.  I have counseled her as an outpatient.  She has also seen  Dr. Jamey Ripa in my office for a second opinion.  The tumor is right under  the areola and we do not think that we can get negative margin without  taking the nipple and areolar complex.  She does want breast  conservation.  She is brought to operating room for a right partial  mastectomy and sentinel node biopsy.   OPERATIVE FINDINGS:  I found four tiny sentinel lymph nodes, all of  which were negative for cancer on imprint cytology.  I did a central  mastectomy with a transverse incision.  There were some  hard fibrocystic  changes laterally so I took an additional lateral margin.  All the  margins were negative according to Dr. Dierdre Searles.   OPERATIVE TECHNIQUE:  The patient underwent needle localization with Dr.  Jeralyn Ruths this morning.  She was brought the holding area at  [facility].  In the holding area the patient was identified.  The  nuclear medicine technician injected the technetium sulfur colloid into  the subareolar area.  The patient was then taken to the operating room.  The patient underwent general anesthesia.  The patient was identified as  correct patient, correct procedure and correct site.   After a sterile prep I injected 5 mL of blue dye in the subareolar area.  This was 2 mL of methylene blue mixed with 3 mL of saline.  The breast  was then massaged for about 3 or 4 minutes.  The right breast and the  right axilla were then prepped and draped in a sterile fashion.  Intravenous antibiotics were given.  Marcaine 0.5% with epinephrine was  used as a local infiltration anesthetic.   A transverse elliptical incision was made in the right breast to  encompass the entire nipple and areolar complex.  Using electrocautery,  I went down into the breast and completely around the palpable mass.  I  marked the specimen on the superior and lateral margins.  The lateral  margin felt quite hard and I could not tell whether this was cancer or  fibrocystic disease.  I sent the breast specimen to Dr. Jeralyn Ruths and she x-rayed it and said that the clip was completely in the  center of the specimen.   I chose to excise some additional lateral margin using electrocautery.  I took out an area of about 3.5 x 3.5 cm x 1.0 cm,  then marked the new  lateral margin with silk sutures, marking the superficial lateral margin  and the inferior lateral margin.  All of the margins were negative  according to Dr. Dierdre Searles and she said that the closest margin was the  superior margin, which  was 7 mm.  Hemostasis was actually very good.  We  did not have to give platelets.  Electrocautery was all that was  necessary to gain hemostasis.  The breast tissues were closed with  interrupted sutures of 3-0 Vicryl and the skin closed with a running  subcuticular suture of 4-0 Monocryl and Steri-Strips.   I then used the NeoProbe to find the area of increased activity in the  right axilla.  This is in the usual place behind the pectoralis major  muscle just at the hairline.  A transverse incision was made at this  location.  Dissection was carried down through the subcutaneous tissue.  I incised the clavipectoral fascia.  I was able to actually trace out  four separate very blue, very hot lymph nodes.  These were very small  lymph nodes, however.  There were all sent to the lab.  Dr. Dierdre Searles did  imprint cytology of all four and said there were no cancer cells seen.  The axilla was irrigated with saline.  Hemostasis was excellent.  The  deeper tissues were closed with interrupted sutures of 3-0 Vicryl and  skin closed with a running subcuticular suture of 4-0 Monocryl and Steri-  Strips.  The wounds were cleansed and dried.  Fluffy bandages were  placed and reinforced with ABD pads.  Six-inch Ace wraps were wrapped  around the patient's torso for some gentle pressure and ice packs put on  this.  The patient tolerated the procedure well and was taken to the  recovery room in stable condition.  Estimated blood loss was about 40  mL.  Complications:  None.  Sponge, needle and instrument counts were  correct.      Angelia Mould. Derrell Lolling, M.D.  Electronically Signed     HMI/MEDQ  D:  03/20/2007  T:  03/21/2007  Job:  [id number]   cc:   Jeannett Senior Hux  Mathis Bud, MD

## 2010-05-26 ENCOUNTER — Encounter (HOSPITAL_BASED_OUTPATIENT_CLINIC_OR_DEPARTMENT_OTHER): Payer: BC Managed Care – PPO | Admitting: Oncology

## 2010-05-26 ENCOUNTER — Other Ambulatory Visit: Payer: Self-pay | Admitting: Oncology

## 2010-05-26 DIAGNOSIS — C50419 Malignant neoplasm of upper-outer quadrant of unspecified female breast: Secondary | ICD-10-CM

## 2010-05-26 DIAGNOSIS — D693 Immune thrombocytopenic purpura: Secondary | ICD-10-CM

## 2010-05-26 LAB — CBC WITH DIFFERENTIAL/PLATELET
BASO%: 1.6 % (ref 0.0–2.0)
Basophils Absolute: 0.1 10*3/uL (ref 0.0–0.1)
EOS%: 2.1 % (ref 0.0–7.0)
Eosinophils Absolute: 0.2 10*3/uL (ref 0.0–0.5)
HCT: 33 % — ABNORMAL LOW (ref 34.8–46.6)
HGB: 10.9 g/dL — ABNORMAL LOW (ref 11.6–15.9)
LYMPH%: 30.6 % (ref 14.0–49.7)
MCH: 26.7 pg (ref 25.1–34.0)
MCHC: 33 g/dL (ref 31.5–36.0)
MCV: 80.9 fL (ref 79.5–101.0)
MONO#: 1.1 10*3/uL — ABNORMAL HIGH (ref 0.1–0.9)
MONO%: 15.5 % — ABNORMAL HIGH (ref 0.0–14.0)
NEUT#: 3.5 10*3/uL (ref 1.5–6.5)
NEUT%: 50.2 % (ref 38.4–76.8)
Platelets: 77 10*3/uL — ABNORMAL LOW (ref 145–400)
RBC: 4.08 10*6/uL (ref 3.70–5.45)
RDW: 15.6 % — ABNORMAL HIGH (ref 11.2–14.5)
WBC: 7 10*3/uL (ref 3.9–10.3)
lymph#: 2.2 10*3/uL (ref 0.9–3.3)
nRBC: 0 % (ref 0–0)

## 2010-06-02 ENCOUNTER — Other Ambulatory Visit: Payer: Self-pay | Admitting: Oncology

## 2010-06-02 ENCOUNTER — Encounter (HOSPITAL_BASED_OUTPATIENT_CLINIC_OR_DEPARTMENT_OTHER): Payer: BC Managed Care – PPO | Admitting: Oncology

## 2010-06-02 DIAGNOSIS — D693 Immune thrombocytopenic purpura: Secondary | ICD-10-CM

## 2010-06-02 LAB — CBC WITH DIFFERENTIAL/PLATELET
BASO%: 1.9 % (ref 0.0–2.0)
Basophils Absolute: 0.1 10*3/uL (ref 0.0–0.1)
EOS%: 3.3 % (ref 0.0–7.0)
Eosinophils Absolute: 0.2 10*3/uL (ref 0.0–0.5)
HCT: 32.5 % — ABNORMAL LOW (ref 34.8–46.6)
HGB: 10.8 g/dL — ABNORMAL LOW (ref 11.6–15.9)
LYMPH%: 28 % (ref 14.0–49.7)
MCH: 26.7 pg (ref 25.1–34.0)
MCHC: 33.2 g/dL (ref 31.5–36.0)
MCV: 80.4 fL (ref 79.5–101.0)
MONO#: 1.1 10*3/uL — ABNORMAL HIGH (ref 0.1–0.9)
MONO%: 16.3 % — ABNORMAL HIGH (ref 0.0–14.0)
NEUT#: 3.5 10*3/uL (ref 1.5–6.5)
NEUT%: 50.5 % (ref 38.4–76.8)
Platelets: 128 10*3/uL — ABNORMAL LOW (ref 145–400)
RBC: 4.04 10*6/uL (ref 3.70–5.45)
RDW: 15.4 % — ABNORMAL HIGH (ref 11.2–14.5)
WBC: 6.9 10*3/uL (ref 3.9–10.3)
lymph#: 1.9 10*3/uL (ref 0.9–3.3)
nRBC: 0 % (ref 0–0)

## 2010-06-02 LAB — TECHNOLOGIST REVIEW

## 2010-06-09 ENCOUNTER — Other Ambulatory Visit: Payer: Self-pay | Admitting: Oncology

## 2010-06-09 ENCOUNTER — Encounter (HOSPITAL_BASED_OUTPATIENT_CLINIC_OR_DEPARTMENT_OTHER): Payer: BC Managed Care – PPO | Admitting: Oncology

## 2010-06-09 DIAGNOSIS — C50419 Malignant neoplasm of upper-outer quadrant of unspecified female breast: Secondary | ICD-10-CM

## 2010-06-09 DIAGNOSIS — D693 Immune thrombocytopenic purpura: Secondary | ICD-10-CM

## 2010-06-09 LAB — CBC WITH DIFFERENTIAL/PLATELET
BASO%: 1.1 % (ref 0.0–2.0)
Basophils Absolute: 0.1 10*3/uL (ref 0.0–0.1)
EOS%: 1.4 % (ref 0.0–7.0)
Eosinophils Absolute: 0.2 10*3/uL (ref 0.0–0.5)
HCT: 33.5 % — ABNORMAL LOW (ref 34.8–46.6)
HGB: 11.2 g/dL — ABNORMAL LOW (ref 11.6–15.9)
LYMPH%: 22.9 % (ref 14.0–49.7)
MCH: 26.9 pg (ref 25.1–34.0)
MCHC: 33.4 g/dL (ref 31.5–36.0)
MCV: 80.5 fL (ref 79.5–101.0)
MONO#: 1.2 10*3/uL — ABNORMAL HIGH (ref 0.1–0.9)
MONO%: 10.9 % (ref 0.0–14.0)
NEUT#: 7.2 10*3/uL — ABNORMAL HIGH (ref 1.5–6.5)
NEUT%: 63.7 % (ref 38.4–76.8)
Platelets: 99 10*3/uL — ABNORMAL LOW (ref 145–400)
RBC: 4.16 10*6/uL (ref 3.70–5.45)
RDW: 15.3 % — ABNORMAL HIGH (ref 11.2–14.5)
WBC: 11.3 10*3/uL — ABNORMAL HIGH (ref 3.9–10.3)
lymph#: 2.6 10*3/uL (ref 0.9–3.3)
nRBC: 0 % (ref 0–0)

## 2010-06-16 ENCOUNTER — Other Ambulatory Visit: Payer: Self-pay | Admitting: Oncology

## 2010-06-16 ENCOUNTER — Encounter (HOSPITAL_BASED_OUTPATIENT_CLINIC_OR_DEPARTMENT_OTHER): Payer: BC Managed Care – PPO | Admitting: Oncology

## 2010-06-16 DIAGNOSIS — D693 Immune thrombocytopenic purpura: Secondary | ICD-10-CM

## 2010-06-16 DIAGNOSIS — C50419 Malignant neoplasm of upper-outer quadrant of unspecified female breast: Secondary | ICD-10-CM

## 2010-06-16 LAB — CBC WITH DIFFERENTIAL/PLATELET
BASO%: 1.2 % (ref 0.0–2.0)
Basophils Absolute: 0.1 10*3/uL (ref 0.0–0.1)
EOS%: 1.9 % (ref 0.0–7.0)
Eosinophils Absolute: 0.2 10*3/uL (ref 0.0–0.5)
HCT: 34.1 % — ABNORMAL LOW (ref 34.8–46.6)
HGB: 11.4 g/dL — ABNORMAL LOW (ref 11.6–15.9)
LYMPH%: 31.5 % (ref 14.0–49.7)
MCH: 26.7 pg (ref 25.1–34.0)
MCHC: 33.4 g/dL (ref 31.5–36.0)
MCV: 79.9 fL (ref 79.5–101.0)
MONO#: 1.1 10*3/uL — ABNORMAL HIGH (ref 0.1–0.9)
MONO%: 12.3 % (ref 0.0–14.0)
NEUT#: 4.7 10*3/uL (ref 1.5–6.5)
NEUT%: 53.1 % (ref 38.4–76.8)
Platelets: 112 10*3/uL — ABNORMAL LOW (ref 145–400)
RBC: 4.27 10*6/uL (ref 3.70–5.45)
RDW: 15.3 % — ABNORMAL HIGH (ref 11.2–14.5)
WBC: 8.8 10*3/uL (ref 3.9–10.3)
lymph#: 2.8 10*3/uL (ref 0.9–3.3)
nRBC: 0 % (ref 0–0)

## 2010-06-23 ENCOUNTER — Other Ambulatory Visit: Payer: Self-pay | Admitting: Oncology

## 2010-06-23 ENCOUNTER — Encounter (HOSPITAL_BASED_OUTPATIENT_CLINIC_OR_DEPARTMENT_OTHER): Payer: BC Managed Care – PPO | Admitting: Oncology

## 2010-06-23 DIAGNOSIS — D693 Immune thrombocytopenic purpura: Secondary | ICD-10-CM

## 2010-06-23 DIAGNOSIS — C50419 Malignant neoplasm of upper-outer quadrant of unspecified female breast: Secondary | ICD-10-CM

## 2010-06-23 LAB — CBC WITH DIFFERENTIAL/PLATELET
BASO%: 1.4 % (ref 0.0–2.0)
Basophils Absolute: 0.1 10*3/uL (ref 0.0–0.1)
EOS%: 2.9 % (ref 0.0–7.0)
Eosinophils Absolute: 0.3 10*3/uL (ref 0.0–0.5)
HCT: 33 % — ABNORMAL LOW (ref 34.8–46.6)
HGB: 11.1 g/dL — ABNORMAL LOW (ref 11.6–15.9)
LYMPH%: 32.8 % (ref 14.0–49.7)
MCH: 26.8 pg (ref 25.1–34.0)
MCHC: 33.6 g/dL (ref 31.5–36.0)
MCV: 79.7 fL (ref 79.5–101.0)
MONO#: 1.1 10*3/uL — ABNORMAL HIGH (ref 0.1–0.9)
MONO%: 12.9 % (ref 0.0–14.0)
NEUT#: 4.4 10*3/uL (ref 1.5–6.5)
NEUT%: 50 % (ref 38.4–76.8)
Platelets: 208 10*3/uL (ref 145–400)
RBC: 4.14 10*6/uL (ref 3.70–5.45)
RDW: 15.3 % — ABNORMAL HIGH (ref 11.2–14.5)
WBC: 8.8 10*3/uL (ref 3.9–10.3)
lymph#: 2.9 10*3/uL (ref 0.9–3.3)
nRBC: 0 % (ref 0–0)

## 2010-06-23 LAB — TECHNOLOGIST REVIEW

## 2010-06-30 ENCOUNTER — Other Ambulatory Visit: Payer: Self-pay | Admitting: Oncology

## 2010-06-30 ENCOUNTER — Encounter (HOSPITAL_BASED_OUTPATIENT_CLINIC_OR_DEPARTMENT_OTHER): Payer: BC Managed Care – PPO | Admitting: Oncology

## 2010-06-30 DIAGNOSIS — D693 Immune thrombocytopenic purpura: Secondary | ICD-10-CM

## 2010-06-30 DIAGNOSIS — C50419 Malignant neoplasm of upper-outer quadrant of unspecified female breast: Secondary | ICD-10-CM

## 2010-06-30 LAB — CBC WITH DIFFERENTIAL/PLATELET
BASO%: 1.6 % (ref 0.0–2.0)
Basophils Absolute: 0.1 10*3/uL (ref 0.0–0.1)
EOS%: 2.6 % (ref 0.0–7.0)
Eosinophils Absolute: 0.2 10*3/uL (ref 0.0–0.5)
HCT: 31.4 % — ABNORMAL LOW (ref 34.8–46.6)
HGB: 10.4 g/dL — ABNORMAL LOW (ref 11.6–15.9)
LYMPH%: 29.6 % (ref 14.0–49.7)
MCH: 26.5 pg (ref 25.1–34.0)
MCHC: 33.1 g/dL (ref 31.5–36.0)
MCV: 80.1 fL (ref 79.5–101.0)
MONO#: 0.9 10*3/uL (ref 0.1–0.9)
MONO%: 11.7 % (ref 0.0–14.0)
NEUT#: 4.1 10*3/uL (ref 1.5–6.5)
NEUT%: 54.5 % (ref 38.4–76.8)
Platelets: 133 10*3/uL — ABNORMAL LOW (ref 145–400)
RBC: 3.92 10*6/uL (ref 3.70–5.45)
RDW: 15.5 % — ABNORMAL HIGH (ref 11.2–14.5)
WBC: 7.6 10*3/uL (ref 3.9–10.3)
lymph#: 2.3 10*3/uL (ref 0.9–3.3)
nRBC: 0 % (ref 0–0)

## 2010-07-07 ENCOUNTER — Encounter (HOSPITAL_BASED_OUTPATIENT_CLINIC_OR_DEPARTMENT_OTHER): Payer: BC Managed Care – PPO | Admitting: Oncology

## 2010-07-07 ENCOUNTER — Other Ambulatory Visit: Payer: Self-pay | Admitting: Oncology

## 2010-07-07 DIAGNOSIS — D693 Immune thrombocytopenic purpura: Secondary | ICD-10-CM

## 2010-07-07 DIAGNOSIS — C50419 Malignant neoplasm of upper-outer quadrant of unspecified female breast: Secondary | ICD-10-CM

## 2010-07-07 LAB — CBC WITH DIFFERENTIAL/PLATELET
BASO%: 1.6 % (ref 0.0–2.0)
Basophils Absolute: 0.1 10*3/uL (ref 0.0–0.1)
EOS%: 2 % (ref 0.0–7.0)
Eosinophils Absolute: 0.2 10*3/uL (ref 0.0–0.5)
HCT: 32.6 % — ABNORMAL LOW (ref 34.8–46.6)
HGB: 10.8 g/dL — ABNORMAL LOW (ref 11.6–15.9)
LYMPH%: 34.9 % (ref 14.0–49.7)
MCH: 26.3 pg (ref 25.1–34.0)
MCHC: 33.1 g/dL (ref 31.5–36.0)
MCV: 79.5 fL (ref 79.5–101.0)
MONO#: 0.9 10*3/uL (ref 0.1–0.9)
MONO%: 11.5 % (ref 0.0–14.0)
NEUT#: 3.7 10*3/uL (ref 1.5–6.5)
NEUT%: 50 % (ref 38.4–76.8)
Platelets: 95 10*3/uL — ABNORMAL LOW (ref 145–400)
RBC: 4.1 10*6/uL (ref 3.70–5.45)
RDW: 15.6 % — ABNORMAL HIGH (ref 11.2–14.5)
WBC: 7.4 10*3/uL (ref 3.9–10.3)
lymph#: 2.6 10*3/uL (ref 0.9–3.3)
nRBC: 0 % (ref 0–0)

## 2010-07-14 ENCOUNTER — Encounter (HOSPITAL_BASED_OUTPATIENT_CLINIC_OR_DEPARTMENT_OTHER): Payer: BC Managed Care – PPO | Admitting: Oncology

## 2010-07-14 ENCOUNTER — Other Ambulatory Visit: Payer: Self-pay | Admitting: Oncology

## 2010-07-14 DIAGNOSIS — C50419 Malignant neoplasm of upper-outer quadrant of unspecified female breast: Secondary | ICD-10-CM

## 2010-07-14 DIAGNOSIS — D693 Immune thrombocytopenic purpura: Secondary | ICD-10-CM

## 2010-07-14 LAB — CBC WITH DIFFERENTIAL/PLATELET
BASO%: 1.8 % (ref 0.0–2.0)
Basophils Absolute: 0.2 10*3/uL — ABNORMAL HIGH (ref 0.0–0.1)
EOS%: 2.9 % (ref 0.0–7.0)
Eosinophils Absolute: 0.3 10*3/uL (ref 0.0–0.5)
HCT: 33.5 % — ABNORMAL LOW (ref 34.8–46.6)
HGB: 11.2 g/dL — ABNORMAL LOW (ref 11.6–15.9)
LYMPH%: 27.7 % (ref 14.0–49.7)
MCH: 26.7 pg (ref 25.1–34.0)
MCHC: 33.4 g/dL (ref 31.5–36.0)
MCV: 79.8 fL (ref 79.5–101.0)
MONO#: 1.1 10*3/uL — ABNORMAL HIGH (ref 0.1–0.9)
MONO%: 12.2 % (ref 0.0–14.0)
NEUT#: 4.9 10*3/uL (ref 1.5–6.5)
NEUT%: 55.4 % (ref 38.4–76.8)
Platelets: 170 10*3/uL (ref 145–400)
RBC: 4.2 10*6/uL (ref 3.70–5.45)
RDW: 16 % — ABNORMAL HIGH (ref 11.2–14.5)
WBC: 8.8 10*3/uL (ref 3.9–10.3)
lymph#: 2.4 10*3/uL (ref 0.9–3.3)
nRBC: 0 % (ref 0–0)

## 2010-07-21 ENCOUNTER — Other Ambulatory Visit: Payer: Self-pay | Admitting: Oncology

## 2010-07-21 ENCOUNTER — Encounter (HOSPITAL_BASED_OUTPATIENT_CLINIC_OR_DEPARTMENT_OTHER): Payer: BC Managed Care – PPO | Admitting: Oncology

## 2010-07-21 DIAGNOSIS — D473 Essential (hemorrhagic) thrombocythemia: Secondary | ICD-10-CM

## 2010-07-21 DIAGNOSIS — D693 Immune thrombocytopenic purpura: Secondary | ICD-10-CM

## 2010-07-21 LAB — CBC WITH DIFFERENTIAL/PLATELET
BASO%: 0.2 % (ref 0.0–2.0)
Basophils Absolute: 0 10*3/uL (ref 0.0–0.1)
EOS%: 2.1 % (ref 0.0–7.0)
Eosinophils Absolute: 0.2 10*3/uL (ref 0.0–0.5)
HCT: 36.5 % (ref 34.8–46.6)
HGB: 11.6 g/dL (ref 11.6–15.9)
LYMPH%: 22.6 % (ref 14.0–49.7)
MCH: 27.4 pg (ref 25.1–34.0)
MCHC: 31.9 g/dL (ref 31.5–36.0)
MCV: 85.8 fL (ref 79.5–101.0)
MONO#: 1.1 10*3/uL — ABNORMAL HIGH (ref 0.1–0.9)
MONO%: 10.6 % (ref 0.0–14.0)
NEUT#: 6.7 10*3/uL — ABNORMAL HIGH (ref 1.5–6.5)
NEUT%: 64.5 % (ref 38.4–76.8)
Platelets: 160 10*3/uL (ref 145–400)
RBC: 4.26 10*6/uL (ref 3.70–5.45)
RDW: 17.3 % — ABNORMAL HIGH (ref 11.2–14.5)
WBC: 10.4 10*3/uL — ABNORMAL HIGH (ref 3.9–10.3)
lymph#: 2.3 10*3/uL (ref 0.9–3.3)

## 2010-07-28 ENCOUNTER — Encounter (HOSPITAL_BASED_OUTPATIENT_CLINIC_OR_DEPARTMENT_OTHER): Payer: BC Managed Care – PPO | Admitting: Oncology

## 2010-07-28 ENCOUNTER — Other Ambulatory Visit: Payer: Self-pay | Admitting: Oncology

## 2010-07-28 DIAGNOSIS — D693 Immune thrombocytopenic purpura: Secondary | ICD-10-CM

## 2010-07-28 LAB — CBC WITH DIFFERENTIAL/PLATELET
BASO%: 1.6 % (ref 0.0–2.0)
Basophils Absolute: 0.1 10*3/uL (ref 0.0–0.1)
EOS%: 2.9 % (ref 0.0–7.0)
Eosinophils Absolute: 0.2 10*3/uL (ref 0.0–0.5)
HCT: 36 % (ref 34.8–46.6)
HGB: 12.1 g/dL (ref 11.6–15.9)
LYMPH%: 29.1 % (ref 14.0–49.7)
MCH: 27.2 pg (ref 25.1–34.0)
MCHC: 33.6 g/dL (ref 31.5–36.0)
MCV: 80.9 fL (ref 79.5–101.0)
MONO#: 0.8 10*3/uL (ref 0.1–0.9)
MONO%: 12.4 % (ref 0.0–14.0)
NEUT#: 3.4 10*3/uL (ref 1.5–6.5)
NEUT%: 54 % (ref 38.4–76.8)
Platelets: 68 10*3/uL — ABNORMAL LOW (ref 145–400)
RBC: 4.45 10*6/uL (ref 3.70–5.45)
RDW: 17.2 % — ABNORMAL HIGH (ref 11.2–14.5)
WBC: 6.2 10*3/uL (ref 3.9–10.3)
lymph#: 1.8 10*3/uL (ref 0.9–3.3)
nRBC: 0 % (ref 0–0)

## 2010-08-04 ENCOUNTER — Other Ambulatory Visit: Payer: Self-pay | Admitting: Oncology

## 2010-08-04 ENCOUNTER — Encounter (HOSPITAL_BASED_OUTPATIENT_CLINIC_OR_DEPARTMENT_OTHER): Payer: BC Managed Care – PPO | Admitting: Oncology

## 2010-08-04 DIAGNOSIS — C50419 Malignant neoplasm of upper-outer quadrant of unspecified female breast: Secondary | ICD-10-CM

## 2010-08-04 DIAGNOSIS — D693 Immune thrombocytopenic purpura: Secondary | ICD-10-CM

## 2010-08-04 LAB — CBC WITH DIFFERENTIAL/PLATELET
BASO%: 0.4 % (ref 0.0–2.0)
Basophils Absolute: 0 10*3/uL (ref 0.0–0.1)
EOS%: 1.7 % (ref 0.0–7.0)
Eosinophils Absolute: 0.1 10*3/uL (ref 0.0–0.5)
HCT: 35 % (ref 34.8–46.6)
HGB: 11.5 g/dL — ABNORMAL LOW (ref 11.6–15.9)
LYMPH%: 37.9 % (ref 14.0–49.7)
MCH: 28.1 pg (ref 25.1–34.0)
MCHC: 32.8 g/dL (ref 31.5–36.0)
MCV: 85.7 fL (ref 79.5–101.0)
MONO#: 0.9 10*3/uL (ref 0.1–0.9)
MONO%: 12.7 % (ref 0.0–14.0)
NEUT#: 3.3 10*3/uL (ref 1.5–6.5)
NEUT%: 47.3 % (ref 38.4–76.8)
Platelets: 80 10*3/uL — ABNORMAL LOW (ref 145–400)
RBC: 4.09 10*6/uL (ref 3.70–5.45)
RDW: 18.2 % — ABNORMAL HIGH (ref 11.2–14.5)
WBC: 6.9 10*3/uL (ref 3.9–10.3)
lymph#: 2.6 10*3/uL (ref 0.9–3.3)

## 2010-08-10 ENCOUNTER — Other Ambulatory Visit: Payer: Self-pay | Admitting: Oncology

## 2010-08-10 ENCOUNTER — Encounter (HOSPITAL_BASED_OUTPATIENT_CLINIC_OR_DEPARTMENT_OTHER): Payer: BC Managed Care – PPO | Admitting: Oncology

## 2010-08-10 DIAGNOSIS — C50419 Malignant neoplasm of upper-outer quadrant of unspecified female breast: Secondary | ICD-10-CM

## 2010-08-10 DIAGNOSIS — D693 Immune thrombocytopenic purpura: Secondary | ICD-10-CM

## 2010-08-10 LAB — CBC WITH DIFFERENTIAL/PLATELET
BASO%: 1.4 % (ref 0.0–2.0)
Basophils Absolute: 0.1 10*3/uL (ref 0.0–0.1)
EOS%: 2.6 % (ref 0.0–7.0)
Eosinophils Absolute: 0.2 10*3/uL (ref 0.0–0.5)
HCT: 36.5 % (ref 34.8–46.6)
HGB: 12 g/dL (ref 11.6–15.9)
LYMPH%: 30.8 % (ref 14.0–49.7)
MCH: 27.2 pg (ref 25.1–34.0)
MCHC: 32.9 g/dL (ref 31.5–36.0)
MCV: 82.8 fL (ref 79.5–101.0)
MONO#: 0.9 10*3/uL (ref 0.1–0.9)
MONO%: 11.8 % (ref 0.0–14.0)
NEUT#: 3.8 10*3/uL (ref 1.5–6.5)
NEUT%: 53.4 % (ref 38.4–76.8)
Platelets: 152 10*3/uL (ref 145–400)
RBC: 4.41 10*6/uL (ref 3.70–5.45)
RDW: 17.5 % — ABNORMAL HIGH (ref 11.2–14.5)
WBC: 7.2 10*3/uL (ref 3.9–10.3)
lymph#: 2.2 10*3/uL (ref 0.9–3.3)

## 2010-08-11 LAB — COMPREHENSIVE METABOLIC PANEL
ALT: 11 U/L (ref 0–35)
AST: 18 U/L (ref 0–37)
Albumin: 3.6 g/dL (ref 3.5–5.2)
Alkaline Phosphatase: 57 U/L (ref 39–117)
BUN: 8 mg/dL (ref 6–23)
CO2: 28 mEq/L (ref 19–32)
Calcium: 9.2 mg/dL (ref 8.4–10.5)
Chloride: 105 mEq/L (ref 96–112)
Creatinine, Ser: 0.64 mg/dL (ref 0.50–1.10)
Glucose, Bld: 94 mg/dL (ref 70–99)
Potassium: 3.9 mEq/L (ref 3.5–5.3)
Sodium: 139 mEq/L (ref 135–145)
Total Bilirubin: 0.4 mg/dL (ref 0.3–1.2)
Total Protein: 6.7 g/dL (ref 6.0–8.3)

## 2010-08-11 LAB — LACTATE DEHYDROGENASE: LDH: 182 U/L (ref 94–250)

## 2010-08-18 ENCOUNTER — Encounter (HOSPITAL_BASED_OUTPATIENT_CLINIC_OR_DEPARTMENT_OTHER): Payer: BC Managed Care – PPO | Admitting: Oncology

## 2010-08-18 ENCOUNTER — Other Ambulatory Visit: Payer: Self-pay | Admitting: Oncology

## 2010-08-18 DIAGNOSIS — C50419 Malignant neoplasm of upper-outer quadrant of unspecified female breast: Secondary | ICD-10-CM

## 2010-08-18 DIAGNOSIS — D693 Immune thrombocytopenic purpura: Secondary | ICD-10-CM

## 2010-08-18 LAB — CBC WITH DIFFERENTIAL/PLATELET
BASO%: 1.9 % (ref 0.0–2.0)
Basophils Absolute: 0.1 10*3/uL (ref 0.0–0.1)
EOS%: 1 % (ref 0.0–7.0)
Eosinophils Absolute: 0.1 10*3/uL (ref 0.0–0.5)
HCT: 36.5 % (ref 34.8–46.6)
HGB: 12 g/dL (ref 11.6–15.9)
LYMPH%: 31.3 % (ref 14.0–49.7)
MCH: 27.3 pg (ref 25.1–34.0)
MCHC: 32.9 g/dL (ref 31.5–36.0)
MCV: 83 fL (ref 79.5–101.0)
MONO#: 0.7 10*3/uL (ref 0.1–0.9)
MONO%: 9.3 % (ref 0.0–14.0)
NEUT#: 4.1 10*3/uL (ref 1.5–6.5)
NEUT%: 56.5 % (ref 38.4–76.8)
Platelets: 162 10*3/uL (ref 145–400)
RBC: 4.4 10*6/uL (ref 3.70–5.45)
RDW: 17.4 % — ABNORMAL HIGH (ref 11.2–14.5)
WBC: 7.3 10*3/uL (ref 3.9–10.3)
lymph#: 2.3 10*3/uL (ref 0.9–3.3)
nRBC: 0 % (ref 0–0)

## 2010-08-27 ENCOUNTER — Other Ambulatory Visit: Payer: Self-pay | Admitting: Oncology

## 2010-08-27 ENCOUNTER — Encounter (HOSPITAL_BASED_OUTPATIENT_CLINIC_OR_DEPARTMENT_OTHER): Payer: BC Managed Care – PPO | Admitting: Oncology

## 2010-08-27 DIAGNOSIS — C50419 Malignant neoplasm of upper-outer quadrant of unspecified female breast: Secondary | ICD-10-CM

## 2010-08-27 DIAGNOSIS — D693 Immune thrombocytopenic purpura: Secondary | ICD-10-CM

## 2010-08-27 LAB — CBC WITH DIFFERENTIAL/PLATELET
BASO%: 2.5 % — ABNORMAL HIGH (ref 0.0–2.0)
Basophils Absolute: 0.1 10*3/uL (ref 0.0–0.1)
EOS%: 4.1 % (ref 0.0–7.0)
Eosinophils Absolute: 0.2 10*3/uL (ref 0.0–0.5)
HCT: 37.4 % (ref 34.8–46.6)
HGB: 12.4 g/dL (ref 11.6–15.9)
LYMPH%: 34 % (ref 14.0–49.7)
MCH: 27.5 pg (ref 25.1–34.0)
MCHC: 33.2 g/dL (ref 31.5–36.0)
MCV: 82.9 fL (ref 79.5–101.0)
MONO#: 0.5 10*3/uL (ref 0.1–0.9)
MONO%: 11 % (ref 0.0–14.0)
NEUT#: 2.3 10*3/uL (ref 1.5–6.5)
NEUT%: 48.4 % (ref 38.4–76.8)
Platelets: 78 10*3/uL — ABNORMAL LOW (ref 145–400)
RBC: 4.51 10*6/uL (ref 3.70–5.45)
RDW: 17.5 % — ABNORMAL HIGH (ref 11.2–14.5)
WBC: 4.8 10*3/uL (ref 3.9–10.3)
lymph#: 1.6 10*3/uL (ref 0.9–3.3)
nRBC: 0 % (ref 0–0)

## 2010-08-27 LAB — COMPREHENSIVE METABOLIC PANEL
ALT: 13 U/L (ref 0–35)
AST: 20 U/L (ref 0–37)
Albumin: 3.8 g/dL (ref 3.5–5.2)
Alkaline Phosphatase: 42 U/L (ref 39–117)
BUN: 9 mg/dL (ref 6–23)
CO2: 27 mEq/L (ref 19–32)
Calcium: 8.9 mg/dL (ref 8.4–10.5)
Chloride: 107 mEq/L (ref 96–112)
Creatinine, Ser: 0.75 mg/dL (ref 0.50–1.10)
Glucose, Bld: 99 mg/dL (ref 70–99)
Potassium: 3.8 mEq/L (ref 3.5–5.3)
Sodium: 142 mEq/L (ref 135–145)
Total Bilirubin: 0.4 mg/dL (ref 0.3–1.2)
Total Protein: 6.3 g/dL (ref 6.0–8.3)

## 2010-08-27 LAB — LACTATE DEHYDROGENASE: LDH: 156 U/L (ref 94–250)

## 2010-09-03 ENCOUNTER — Other Ambulatory Visit: Payer: Self-pay | Admitting: Oncology

## 2010-09-03 ENCOUNTER — Encounter: Payer: BC Managed Care – PPO | Admitting: Oncology

## 2010-09-03 LAB — CBC WITH DIFFERENTIAL/PLATELET
BASO%: 1 % (ref 0.0–2.0)
Basophils Absolute: 0.1 10*3/uL (ref 0.0–0.1)
EOS%: 1.7 % (ref 0.0–7.0)
Eosinophils Absolute: 0.1 10*3/uL (ref 0.0–0.5)
HCT: 37.3 % (ref 34.8–46.6)
HGB: 12.3 g/dL (ref 11.6–15.9)
LYMPH%: 31.6 % (ref 14.0–49.7)
MCH: 28.5 pg (ref 25.1–34.0)
MCHC: 33 g/dL (ref 31.5–36.0)
MCV: 86.3 fL (ref 79.5–101.0)
MONO#: 0.8 10*3/uL (ref 0.1–0.9)
MONO%: 10.6 % (ref 0.0–14.0)
NEUT#: 4.3 10*3/uL (ref 1.5–6.5)
NEUT%: 55.1 % (ref 38.4–76.8)
Platelets: 87 10*3/uL — ABNORMAL LOW (ref 145–400)
RBC: 4.32 10*6/uL (ref 3.70–5.45)
RDW: 18.9 % — ABNORMAL HIGH (ref 11.2–14.5)
WBC: 7.8 10*3/uL (ref 3.9–10.3)
lymph#: 2.5 10*3/uL (ref 0.9–3.3)

## 2010-09-10 ENCOUNTER — Encounter (HOSPITAL_BASED_OUTPATIENT_CLINIC_OR_DEPARTMENT_OTHER): Payer: BC Managed Care – PPO | Admitting: Oncology

## 2010-09-10 ENCOUNTER — Other Ambulatory Visit: Payer: Self-pay | Admitting: Oncology

## 2010-09-10 DIAGNOSIS — C50419 Malignant neoplasm of upper-outer quadrant of unspecified female breast: Secondary | ICD-10-CM

## 2010-09-10 DIAGNOSIS — D693 Immune thrombocytopenic purpura: Secondary | ICD-10-CM

## 2010-09-10 LAB — CBC WITH DIFFERENTIAL/PLATELET
BASO%: 1.6 % (ref 0.0–2.0)
Basophils Absolute: 0.1 10*3/uL (ref 0.0–0.1)
EOS%: 1.2 % (ref 0.0–7.0)
Eosinophils Absolute: 0.1 10*3/uL (ref 0.0–0.5)
HCT: 35 % (ref 34.8–46.6)
HGB: 11.6 g/dL (ref 11.6–15.9)
LYMPH%: 28.8 % (ref 14.0–49.7)
MCH: 27.5 pg (ref 25.1–34.0)
MCHC: 33.1 g/dL (ref 31.5–36.0)
MCV: 82.9 fL (ref 79.5–101.0)
MONO#: 1 10*3/uL — ABNORMAL HIGH (ref 0.1–0.9)
MONO%: 13.3 % (ref 0.0–14.0)
NEUT#: 4.1 10*3/uL (ref 1.5–6.5)
NEUT%: 55.1 % (ref 38.4–76.8)
Platelets: 83 10*3/uL — ABNORMAL LOW (ref 145–400)
RBC: 4.22 10*6/uL (ref 3.70–5.45)
RDW: 18 % — ABNORMAL HIGH (ref 11.2–14.5)
WBC: 7.5 10*3/uL (ref 3.9–10.3)
lymph#: 2.2 10*3/uL (ref 0.9–3.3)
nRBC: 0 % (ref 0–0)

## 2010-09-17 ENCOUNTER — Encounter (HOSPITAL_BASED_OUTPATIENT_CLINIC_OR_DEPARTMENT_OTHER): Payer: BC Managed Care – PPO | Admitting: Oncology

## 2010-09-17 ENCOUNTER — Other Ambulatory Visit: Payer: Self-pay | Admitting: Oncology

## 2010-09-17 DIAGNOSIS — D473 Essential (hemorrhagic) thrombocythemia: Secondary | ICD-10-CM

## 2010-09-17 DIAGNOSIS — D693 Immune thrombocytopenic purpura: Secondary | ICD-10-CM

## 2010-09-17 LAB — CBC WITH DIFFERENTIAL/PLATELET
BASO%: 0.9 % (ref 0.0–2.0)
Basophils Absolute: 0.1 10*3/uL (ref 0.0–0.1)
EOS%: 1.1 % (ref 0.0–7.0)
Eosinophils Absolute: 0.1 10*3/uL (ref 0.0–0.5)
HCT: 38.4 % (ref 34.8–46.6)
HGB: 12.6 g/dL (ref 11.6–15.9)
LYMPH%: 28.9 % (ref 14.0–49.7)
MCH: 28.3 pg (ref 25.1–34.0)
MCHC: 33 g/dL (ref 31.5–36.0)
MCV: 85.9 fL (ref 79.5–101.0)
MONO#: 1 10*3/uL — ABNORMAL HIGH (ref 0.1–0.9)
MONO%: 15 % — ABNORMAL HIGH (ref 0.0–14.0)
NEUT#: 3.6 10*3/uL (ref 1.5–6.5)
NEUT%: 54.1 % (ref 38.4–76.8)
Platelets: 96 10*3/uL — ABNORMAL LOW (ref 145–400)
RBC: 4.47 10*6/uL (ref 3.70–5.45)
RDW: 17.7 % — ABNORMAL HIGH (ref 11.2–14.5)
WBC: 6.6 10*3/uL (ref 3.9–10.3)
lymph#: 1.9 10*3/uL (ref 0.9–3.3)

## 2010-09-24 ENCOUNTER — Encounter (HOSPITAL_BASED_OUTPATIENT_CLINIC_OR_DEPARTMENT_OTHER): Payer: BC Managed Care – PPO | Admitting: Oncology

## 2010-09-24 ENCOUNTER — Other Ambulatory Visit: Payer: Self-pay | Admitting: Oncology

## 2010-09-24 DIAGNOSIS — D473 Essential (hemorrhagic) thrombocythemia: Secondary | ICD-10-CM

## 2010-09-24 DIAGNOSIS — D693 Immune thrombocytopenic purpura: Secondary | ICD-10-CM

## 2010-09-24 LAB — CBC WITH DIFFERENTIAL/PLATELET
BASO%: 1.2 % (ref 0.0–2.0)
Basophils Absolute: 0.1 10*3/uL (ref 0.0–0.1)
EOS%: 0.8 % (ref 0.0–7.0)
Eosinophils Absolute: 0.1 10*3/uL (ref 0.0–0.5)
HCT: 37.4 % (ref 34.8–46.6)
HGB: 12.6 g/dL (ref 11.6–15.9)
LYMPH%: 23.2 % (ref 14.0–49.7)
MCH: 28.1 pg (ref 25.1–34.0)
MCHC: 33.7 g/dL (ref 31.5–36.0)
MCV: 83.3 fL (ref 79.5–101.0)
MONO#: 0.9 10*3/uL (ref 0.1–0.9)
MONO%: 10.8 % (ref 0.0–14.0)
NEUT#: 5.4 10*3/uL (ref 1.5–6.5)
NEUT%: 64 % (ref 38.4–76.8)
Platelets: 151 10*3/uL (ref 145–400)
RBC: 4.49 10*6/uL (ref 3.70–5.45)
RDW: 17.4 % — ABNORMAL HIGH (ref 11.2–14.5)
WBC: 8.4 10*3/uL (ref 3.9–10.3)
lymph#: 1.9 10*3/uL (ref 0.9–3.3)
nRBC: 0 % (ref 0–0)

## 2010-09-28 LAB — TYPE AND SCREEN
ABO/RH(D): A POS
Antibody Screen: NEGATIVE

## 2010-09-28 LAB — COMPREHENSIVE METABOLIC PANEL
ALT: 17
AST: 18
Albumin: 3.7
Alkaline Phosphatase: 51
BUN: 5 — ABNORMAL LOW
CO2: 27
Calcium: 9
Chloride: 110
Creatinine, Ser: 0.73
GFR calc Af Amer: >60
GFR calc non Af Amer: >60
Glucose, Bld: 96
Potassium: 4.3
Sodium: 142
Total Bilirubin: 0.6
Total Protein: 6

## 2010-09-28 LAB — CBC
HCT: 39.5
Hemoglobin: 13.3
MCHC: 33.7
MCV: 87.1
Platelets: 60 — ABNORMAL LOW
RBC: 4.54
RDW: 13.8
WBC: 6.2

## 2010-09-28 LAB — URINALYSIS, ROUTINE W REFLEX MICROSCOPIC
Bilirubin Urine: NEGATIVE
Glucose, UA: NEGATIVE
Hgb urine dipstick: NEGATIVE
Ketones, ur: NEGATIVE
Nitrite: NEGATIVE
Protein, ur: NEGATIVE
Specific Gravity, Urine: 1.015
Urobilinogen, UA: 0.2
pH: 7

## 2010-09-28 LAB — ABO/RH: ABO/RH(D): A POS

## 2010-10-01 ENCOUNTER — Other Ambulatory Visit: Payer: Self-pay | Admitting: Oncology

## 2010-10-01 ENCOUNTER — Encounter (HOSPITAL_BASED_OUTPATIENT_CLINIC_OR_DEPARTMENT_OTHER): Payer: BC Managed Care – PPO | Admitting: Oncology

## 2010-10-01 DIAGNOSIS — C50419 Malignant neoplasm of upper-outer quadrant of unspecified female breast: Secondary | ICD-10-CM

## 2010-10-01 DIAGNOSIS — D693 Immune thrombocytopenic purpura: Secondary | ICD-10-CM

## 2010-10-01 LAB — CBC WITH DIFFERENTIAL/PLATELET
BASO%: 2.2 % — ABNORMAL HIGH (ref 0.0–2.0)
Basophils Absolute: 0.1 10*3/uL (ref 0.0–0.1)
EOS%: 3 % (ref 0.0–7.0)
Eosinophils Absolute: 0.2 10*3/uL (ref 0.0–0.5)
HCT: 38.3 % (ref 34.8–46.6)
HGB: 12.7 g/dL (ref 11.6–15.9)
LYMPH%: 31 % (ref 14.0–49.7)
MCH: 27.8 pg (ref 25.1–34.0)
MCHC: 33.2 g/dL (ref 31.5–36.0)
MCV: 83.8 fL (ref 79.5–101.0)
MONO#: 0.8 10*3/uL (ref 0.1–0.9)
MONO%: 12.6 % (ref 0.0–14.0)
NEUT#: 3.1 10*3/uL (ref 1.5–6.5)
NEUT%: 51.2 % (ref 38.4–76.8)
Platelets: 85 10*3/uL — ABNORMAL LOW (ref 145–400)
RBC: 4.57 10*6/uL (ref 3.70–5.45)
RDW: 17.1 % — ABNORMAL HIGH (ref 11.2–14.5)
WBC: 6 10*3/uL (ref 3.9–10.3)
lymph#: 1.9 10*3/uL (ref 0.9–3.3)
nRBC: 0 % (ref 0–0)

## 2010-10-08 ENCOUNTER — Other Ambulatory Visit: Payer: Self-pay | Admitting: Oncology

## 2010-10-08 ENCOUNTER — Encounter (HOSPITAL_BASED_OUTPATIENT_CLINIC_OR_DEPARTMENT_OTHER): Payer: BC Managed Care – PPO | Admitting: Oncology

## 2010-10-08 DIAGNOSIS — D693 Immune thrombocytopenic purpura: Secondary | ICD-10-CM

## 2010-10-08 DIAGNOSIS — C50419 Malignant neoplasm of upper-outer quadrant of unspecified female breast: Secondary | ICD-10-CM

## 2010-10-08 LAB — CBC WITH DIFFERENTIAL/PLATELET
BASO%: 1.7 % (ref 0.0–2.0)
Basophils Absolute: 0.1 10*3/uL (ref 0.0–0.1)
EOS%: 2.2 % (ref 0.0–7.0)
Eosinophils Absolute: 0.2 10*3/uL (ref 0.0–0.5)
HCT: 38.7 % (ref 34.8–46.6)
HGB: 12.8 g/dL (ref 11.6–15.9)
LYMPH%: 31.9 % (ref 14.0–49.7)
MCH: 27.9 pg (ref 25.1–34.0)
MCHC: 33.1 g/dL (ref 31.5–36.0)
MCV: 84.3 fL (ref 79.5–101.0)
MONO#: 0.8 10*3/uL (ref 0.1–0.9)
MONO%: 11.5 % (ref 0.0–14.0)
NEUT#: 3.7 10*3/uL (ref 1.5–6.5)
NEUT%: 52.7 % (ref 38.4–76.8)
Platelets: 63 10*3/uL — ABNORMAL LOW (ref 145–400)
RBC: 4.59 10*6/uL (ref 3.70–5.45)
RDW: 16.7 % — ABNORMAL HIGH (ref 11.2–14.5)
WBC: 7 10*3/uL (ref 3.9–10.3)
lymph#: 2.2 10*3/uL (ref 0.9–3.3)
nRBC: 0 % (ref 0–0)

## 2010-10-15 ENCOUNTER — Other Ambulatory Visit: Payer: Self-pay | Admitting: Oncology

## 2010-10-15 ENCOUNTER — Encounter (HOSPITAL_BASED_OUTPATIENT_CLINIC_OR_DEPARTMENT_OTHER): Payer: BC Managed Care – PPO | Admitting: Oncology

## 2010-10-15 DIAGNOSIS — C50419 Malignant neoplasm of upper-outer quadrant of unspecified female breast: Secondary | ICD-10-CM

## 2010-10-15 DIAGNOSIS — D693 Immune thrombocytopenic purpura: Secondary | ICD-10-CM

## 2010-10-15 LAB — CBC WITH DIFFERENTIAL/PLATELET
BASO%: 1.4 % (ref 0.0–2.0)
Basophils Absolute: 0.1 10*3/uL (ref 0.0–0.1)
EOS%: 1.7 % (ref 0.0–7.0)
Eosinophils Absolute: 0.1 10*3/uL (ref 0.0–0.5)
HCT: 36.7 % (ref 34.8–46.6)
HGB: 12.3 g/dL (ref 11.6–15.9)
LYMPH%: 27.7 % (ref 14.0–49.7)
MCH: 28.1 pg (ref 25.1–34.0)
MCHC: 33.5 g/dL (ref 31.5–36.0)
MCV: 83.8 fL (ref 79.5–101.0)
MONO#: 1.1 10*3/uL — ABNORMAL HIGH (ref 0.1–0.9)
MONO%: 13.8 % (ref 0.0–14.0)
NEUT#: 4.3 10*3/uL (ref 1.5–6.5)
NEUT%: 55.4 % (ref 38.4–76.8)
Platelets: 67 10*3/uL — ABNORMAL LOW (ref 145–400)
RBC: 4.38 10*6/uL (ref 3.70–5.45)
RDW: 16.4 % — ABNORMAL HIGH (ref 11.2–14.5)
WBC: 7.8 10*3/uL (ref 3.9–10.3)
lymph#: 2.2 10*3/uL (ref 0.9–3.3)
nRBC: 0 % (ref 0–0)

## 2010-10-22 ENCOUNTER — Encounter (HOSPITAL_BASED_OUTPATIENT_CLINIC_OR_DEPARTMENT_OTHER): Payer: BC Managed Care – PPO | Admitting: Oncology

## 2010-10-22 ENCOUNTER — Other Ambulatory Visit: Payer: Self-pay | Admitting: Oncology

## 2010-10-22 DIAGNOSIS — C50419 Malignant neoplasm of upper-outer quadrant of unspecified female breast: Secondary | ICD-10-CM

## 2010-10-22 DIAGNOSIS — D693 Immune thrombocytopenic purpura: Secondary | ICD-10-CM

## 2010-10-22 LAB — CBC WITH DIFFERENTIAL/PLATELET
BASO%: 1.9 % (ref 0.0–2.0)
Basophils Absolute: 0.2 10*3/uL — ABNORMAL HIGH (ref 0.0–0.1)
EOS%: 2.3 % (ref 0.0–7.0)
Eosinophils Absolute: 0.2 10*3/uL (ref 0.0–0.5)
HCT: 39.2 % (ref 34.8–46.6)
HGB: 13.3 g/dL (ref 11.6–15.9)
LYMPH%: 25.1 % (ref 14.0–49.7)
MCH: 28.4 pg (ref 25.1–34.0)
MCHC: 33.9 g/dL (ref 31.5–36.0)
MCV: 83.6 fL (ref 79.5–101.0)
MONO#: 1 10*3/uL — ABNORMAL HIGH (ref 0.1–0.9)
MONO%: 13.1 % (ref 0.0–14.0)
NEUT#: 4.6 10*3/uL (ref 1.5–6.5)
NEUT%: 57.6 % (ref 38.4–76.8)
Platelets: 157 10*3/uL (ref 145–400)
RBC: 4.69 10*6/uL (ref 3.70–5.45)
RDW: 16.5 % — ABNORMAL HIGH (ref 11.2–14.5)
WBC: 7.9 10*3/uL (ref 3.9–10.3)
lymph#: 2 10*3/uL (ref 0.9–3.3)
nRBC: 0 % (ref 0–0)

## 2010-10-29 ENCOUNTER — Other Ambulatory Visit: Payer: Self-pay | Admitting: Oncology

## 2010-10-29 ENCOUNTER — Encounter (HOSPITAL_BASED_OUTPATIENT_CLINIC_OR_DEPARTMENT_OTHER): Payer: BC Managed Care – PPO | Admitting: Oncology

## 2010-10-29 DIAGNOSIS — C50419 Malignant neoplasm of upper-outer quadrant of unspecified female breast: Secondary | ICD-10-CM

## 2010-10-29 DIAGNOSIS — D693 Immune thrombocytopenic purpura: Secondary | ICD-10-CM

## 2010-10-29 LAB — CBC WITH DIFFERENTIAL/PLATELET
BASO%: 0.5 % (ref 0.0–2.0)
Basophils Absolute: 0 10*3/uL (ref 0.0–0.1)
EOS%: 0.4 % (ref 0.0–7.0)
Eosinophils Absolute: 0 10*3/uL (ref 0.0–0.5)
HCT: 38.9 % (ref 34.8–46.6)
HGB: 12.8 g/dL (ref 11.6–15.9)
LYMPH%: 19.5 % (ref 14.0–49.7)
MCH: 29.1 pg (ref 25.1–34.0)
MCHC: 32.8 g/dL (ref 31.5–36.0)
MCV: 88.9 fL (ref 79.5–101.0)
MONO#: 1.1 10*3/uL — ABNORMAL HIGH (ref 0.1–0.9)
MONO%: 12.7 % (ref 0.0–14.0)
NEUT#: 5.5 10*3/uL (ref 1.5–6.5)
NEUT%: 66.9 % (ref 38.4–76.8)
Platelets: 110 10*3/uL — ABNORMAL LOW (ref 145–400)
RBC: 4.38 10*6/uL (ref 3.70–5.45)
RDW: 16.2 % — ABNORMAL HIGH (ref 11.2–14.5)
WBC: 8.3 10*3/uL (ref 3.9–10.3)
lymph#: 1.6 10*3/uL (ref 0.9–3.3)

## 2010-11-05 ENCOUNTER — Encounter (HOSPITAL_BASED_OUTPATIENT_CLINIC_OR_DEPARTMENT_OTHER): Payer: BC Managed Care – PPO | Admitting: Oncology

## 2010-11-05 ENCOUNTER — Other Ambulatory Visit: Payer: Self-pay | Admitting: Oncology

## 2010-11-05 DIAGNOSIS — C50419 Malignant neoplasm of upper-outer quadrant of unspecified female breast: Secondary | ICD-10-CM

## 2010-11-05 DIAGNOSIS — D693 Immune thrombocytopenic purpura: Secondary | ICD-10-CM

## 2010-11-05 LAB — COMPREHENSIVE METABOLIC PANEL
ALT: 13 U/L (ref 0–35)
AST: 19 U/L (ref 0–37)
Albumin: 3.9 g/dL (ref 3.5–5.2)
Alkaline Phosphatase: 41 U/L (ref 39–117)
BUN: 12 mg/dL (ref 6–23)
CO2: 22 mEq/L (ref 19–32)
Calcium: 9 mg/dL (ref 8.4–10.5)
Chloride: 107 mEq/L (ref 96–112)
Creatinine, Ser: 0.89 mg/dL (ref 0.50–1.10)
Glucose, Bld: 98 mg/dL (ref 70–99)
Potassium: 4 mEq/L (ref 3.5–5.3)
Sodium: 141 mEq/L (ref 135–145)
Total Bilirubin: 0.3 mg/dL (ref 0.3–1.2)
Total Protein: 6.3 g/dL (ref 6.0–8.3)

## 2010-11-05 LAB — CBC WITH DIFFERENTIAL/PLATELET
BASO%: 1.6 % (ref 0.0–2.0)
Basophils Absolute: 0.2 10*3/uL — ABNORMAL HIGH (ref 0.0–0.1)
EOS%: 1.2 % (ref 0.0–7.0)
Eosinophils Absolute: 0.1 10*3/uL (ref 0.0–0.5)
HCT: 37.4 % (ref 34.8–46.6)
HGB: 12.3 g/dL (ref 11.6–15.9)
LYMPH%: 21.7 % (ref 14.0–49.7)
MCH: 29.4 pg (ref 25.1–34.0)
MCHC: 32.9 g/dL (ref 31.5–36.0)
MCV: 89.1 fL (ref 79.5–101.0)
MONO#: 1.1 10*3/uL — ABNORMAL HIGH (ref 0.1–0.9)
MONO%: 10.6 % (ref 0.0–14.0)
NEUT#: 6.7 10*3/uL — ABNORMAL HIGH (ref 1.5–6.5)
NEUT%: 64.9 % (ref 38.4–76.8)
Platelets: 88 10*3/uL — ABNORMAL LOW (ref 145–400)
RBC: 4.19 10*6/uL (ref 3.70–5.45)
RDW: 15.9 % — ABNORMAL HIGH (ref 11.2–14.5)
WBC: 10.3 10*3/uL (ref 3.9–10.3)
lymph#: 2.2 10*3/uL (ref 0.9–3.3)

## 2010-11-05 LAB — LACTATE DEHYDROGENASE: LDH: 188 U/L (ref 94–250)

## 2010-11-12 ENCOUNTER — Other Ambulatory Visit: Payer: Self-pay | Admitting: Oncology

## 2010-11-12 ENCOUNTER — Other Ambulatory Visit (HOSPITAL_BASED_OUTPATIENT_CLINIC_OR_DEPARTMENT_OTHER): Payer: BC Managed Care – PPO | Admitting: Lab

## 2010-11-12 ENCOUNTER — Ambulatory Visit (HOSPITAL_BASED_OUTPATIENT_CLINIC_OR_DEPARTMENT_OTHER): Payer: BC Managed Care – PPO

## 2010-11-12 VITALS — BP 121/72 | HR 62 | Temp 97.8°F | Ht 66.5 in | Wt 152.0 lb

## 2010-11-12 DIAGNOSIS — C50419 Malignant neoplasm of upper-outer quadrant of unspecified female breast: Secondary | ICD-10-CM

## 2010-11-12 DIAGNOSIS — D693 Immune thrombocytopenic purpura: Secondary | ICD-10-CM

## 2010-11-12 LAB — CBC WITH DIFFERENTIAL/PLATELET
BASO%: 1.9 % (ref 0.0–2.0)
Basophils Absolute: 0.2 10*3/uL — ABNORMAL HIGH (ref 0.0–0.1)
EOS%: 0.9 % (ref 0.0–7.0)
Eosinophils Absolute: 0.1 10*3/uL (ref 0.0–0.5)
HCT: 37.8 % (ref 34.8–46.6)
HGB: 12.5 g/dL (ref 11.6–15.9)
LYMPH%: 26.8 % (ref 14.0–49.7)
MCH: 29.3 pg (ref 25.1–34.0)
MCHC: 33 g/dL (ref 31.5–36.0)
MCV: 88.9 fL (ref 79.5–101.0)
MONO#: 1.2 10*3/uL — ABNORMAL HIGH (ref 0.1–0.9)
MONO%: 13.8 % (ref 0.0–14.0)
NEUT#: 4.8 10*3/uL (ref 1.5–6.5)
NEUT%: 56.6 % (ref 38.4–76.8)
Platelets: 173 10*3/uL (ref 145–400)
RBC: 4.25 10*6/uL (ref 3.70–5.45)
RDW: 15.9 % — ABNORMAL HIGH (ref 11.2–14.5)
WBC: 8.5 10*3/uL (ref 3.9–10.3)
lymph#: 2.3 10*3/uL (ref 0.9–3.3)

## 2010-11-12 MED ORDER — ROMIPLOSTIM 250 MCG ~~LOC~~ SOLR
1.5000 ug/kg | Freq: Once | SUBCUTANEOUS | Status: AC
  Administered 2010-11-12: 105 ug via SUBCUTANEOUS
  Filled 2010-11-12: qty 0.21

## 2010-11-16 ENCOUNTER — Other Ambulatory Visit: Payer: Self-pay

## 2010-11-16 ENCOUNTER — Telehealth: Payer: Self-pay | Admitting: Oncology

## 2010-11-16 ENCOUNTER — Encounter: Payer: Self-pay | Admitting: Oncology

## 2010-11-16 ENCOUNTER — Other Ambulatory Visit: Payer: Self-pay | Admitting: Oncology

## 2010-11-16 DIAGNOSIS — Z853 Personal history of malignant neoplasm of breast: Secondary | ICD-10-CM | POA: Insufficient documentation

## 2010-11-16 DIAGNOSIS — F418 Other specified anxiety disorders: Secondary | ICD-10-CM

## 2010-11-16 DIAGNOSIS — D693 Immune thrombocytopenic purpura: Secondary | ICD-10-CM

## 2010-11-16 HISTORY — DX: Other specified anxiety disorders: F41.8

## 2010-11-16 NOTE — Telephone Encounter (Signed)
Called pt left message for injection 11/15 and 11/23

## 2010-11-17 ENCOUNTER — Telehealth: Payer: Self-pay | Admitting: Oncology

## 2010-11-17 NOTE — Telephone Encounter (Signed)
Called pt again tonight asked pt to come and get calendar, hence we added more appt.

## 2010-11-19 ENCOUNTER — Other Ambulatory Visit: Payer: Self-pay | Admitting: *Deleted

## 2010-11-19 ENCOUNTER — Other Ambulatory Visit: Payer: Self-pay | Admitting: Oncology

## 2010-11-19 ENCOUNTER — Other Ambulatory Visit (HOSPITAL_BASED_OUTPATIENT_CLINIC_OR_DEPARTMENT_OTHER): Payer: BC Managed Care – PPO | Admitting: Lab

## 2010-11-19 ENCOUNTER — Telehealth: Payer: Self-pay | Admitting: *Deleted

## 2010-11-19 ENCOUNTER — Ambulatory Visit: Payer: BC Managed Care – PPO

## 2010-11-19 ENCOUNTER — Ambulatory Visit (HOSPITAL_BASED_OUTPATIENT_CLINIC_OR_DEPARTMENT_OTHER): Payer: BC Managed Care – PPO

## 2010-11-19 VITALS — BP 122/64 | HR 65 | Temp 97.4°F | Wt 152.0 lb

## 2010-11-19 DIAGNOSIS — D693 Immune thrombocytopenic purpura: Secondary | ICD-10-CM

## 2010-11-19 LAB — CBC WITH DIFFERENTIAL/PLATELET
BASO%: 2.2 % — ABNORMAL HIGH (ref 0.0–2.0)
Basophils Absolute: 0.2 10*3/uL — ABNORMAL HIGH (ref 0.0–0.1)
EOS%: 3.9 % (ref 0.0–7.0)
Eosinophils Absolute: 0.3 10*3/uL (ref 0.0–0.5)
HCT: 37.6 % (ref 34.8–46.6)
HGB: 12.5 g/dL (ref 11.6–15.9)
LYMPH%: 28 % (ref 14.0–49.7)
MCH: 28.5 pg (ref 25.1–34.0)
MCHC: 33.2 g/dL (ref 31.5–36.0)
MCV: 85.8 fL (ref 79.5–101.0)
MONO#: 1 10*3/uL — ABNORMAL HIGH (ref 0.1–0.9)
MONO%: 13.5 % (ref 0.0–14.0)
NEUT#: 3.8 10*3/uL (ref 1.5–6.5)
NEUT%: 52.4 % (ref 38.4–76.8)
Platelets: 230 10*3/uL (ref 145–400)
RBC: 4.38 10*6/uL (ref 3.70–5.45)
RDW: 15.7 % — ABNORMAL HIGH (ref 11.2–14.5)
WBC: 7.3 10*3/uL (ref 3.9–10.3)
lymph#: 2 10*3/uL (ref 0.9–3.3)
nRBC: 0 % (ref 0–0)

## 2010-11-19 MED ORDER — ROMIPLOSTIM 250 MCG ~~LOC~~ SOLR
1.5000 ug/kg | Freq: Once | SUBCUTANEOUS | Status: AC
  Administered 2010-11-19: 105 ug via SUBCUTANEOUS
  Filled 2010-11-19: qty 0.21

## 2010-11-19 NOTE — Patient Instructions (Signed)
Instructed patient to call if any problems with bleeding or bruising.  Reminded patient of appointment 11/27/10

## 2010-11-19 NOTE — Telephone Encounter (Signed)
Left message on pt's identified vm to stop N-plate & cont. promacta & weekly cbc for now per Dr. Cyndie Chime.  Req that she call back to let us know that she received this message.

## 2010-11-26 ENCOUNTER — Ambulatory Visit: Payer: BC Managed Care – PPO

## 2010-11-27 ENCOUNTER — Ambulatory Visit: Payer: BC Managed Care – PPO

## 2010-11-27 ENCOUNTER — Other Ambulatory Visit: Payer: Self-pay | Admitting: Oncology

## 2010-11-27 ENCOUNTER — Other Ambulatory Visit (HOSPITAL_BASED_OUTPATIENT_CLINIC_OR_DEPARTMENT_OTHER): Payer: BC Managed Care – PPO | Admitting: Lab

## 2010-11-27 ENCOUNTER — Other Ambulatory Visit: Payer: BC Managed Care – PPO | Admitting: Lab

## 2010-11-27 DIAGNOSIS — D693 Immune thrombocytopenic purpura: Secondary | ICD-10-CM

## 2010-11-27 DIAGNOSIS — C50419 Malignant neoplasm of upper-outer quadrant of unspecified female breast: Secondary | ICD-10-CM

## 2010-11-27 LAB — CBC WITH DIFFERENTIAL/PLATELET
BASO%: 0.8 % (ref 0.0–2.0)
Basophils Absolute: 0.1 10*3/uL (ref 0.0–0.1)
EOS%: 2.7 % (ref 0.0–7.0)
Eosinophils Absolute: 0.2 10*3/uL (ref 0.0–0.5)
HCT: 36.6 % (ref 34.8–46.6)
HGB: 11.8 g/dL (ref 11.6–15.9)
LYMPH%: 27.7 % (ref 14.0–49.7)
MCH: 29 pg (ref 25.1–34.0)
MCHC: 32.2 g/dL (ref 31.5–36.0)
MCV: 90 fL (ref 79.5–101.0)
MONO#: 1 10*3/uL — ABNORMAL HIGH (ref 0.1–0.9)
MONO%: 11.9 % (ref 0.0–14.0)
NEUT#: 4.5 10*3/uL (ref 1.5–6.5)
NEUT%: 56.9 % (ref 38.4–76.8)
Platelets: 199 10*3/uL (ref 145–400)
RBC: 4.07 10*6/uL (ref 3.70–5.45)
RDW: 15.4 % — ABNORMAL HIGH (ref 11.2–14.5)
WBC: 8 10*3/uL (ref 3.9–10.3)
lymph#: 2.2 10*3/uL (ref 0.9–3.3)

## 2010-12-03 ENCOUNTER — Ambulatory Visit: Payer: BC Managed Care – PPO

## 2010-12-03 ENCOUNTER — Other Ambulatory Visit (HOSPITAL_BASED_OUTPATIENT_CLINIC_OR_DEPARTMENT_OTHER): Payer: BC Managed Care – PPO | Admitting: Lab

## 2010-12-03 DIAGNOSIS — D693 Immune thrombocytopenic purpura: Secondary | ICD-10-CM

## 2010-12-03 DIAGNOSIS — C50419 Malignant neoplasm of upper-outer quadrant of unspecified female breast: Secondary | ICD-10-CM

## 2010-12-03 LAB — CBC WITH DIFFERENTIAL/PLATELET
BASO%: 2.2 % — ABNORMAL HIGH (ref 0.0–2.0)
Basophils Absolute: 0.2 10*3/uL — ABNORMAL HIGH (ref 0.0–0.1)
EOS%: 4 % (ref 0.0–7.0)
Eosinophils Absolute: 0.3 10*3/uL (ref 0.0–0.5)
HCT: 37 % (ref 34.8–46.6)
HGB: 12.2 g/dL (ref 11.6–15.9)
LYMPH%: 28.3 % (ref 14.0–49.7)
MCH: 28.3 pg (ref 25.1–34.0)
MCHC: 33 g/dL (ref 31.5–36.0)
MCV: 85.8 fL (ref 79.5–101.0)
MONO#: 1 10*3/uL — ABNORMAL HIGH (ref 0.1–0.9)
MONO%: 11.7 % (ref 0.0–14.0)
NEUT#: 4.6 10*3/uL (ref 1.5–6.5)
NEUT%: 53.8 % (ref 38.4–76.8)
Platelets: 168 10*3/uL (ref 145–400)
RBC: 4.31 10*6/uL (ref 3.70–5.45)
RDW: 15.2 % — ABNORMAL HIGH (ref 11.2–14.5)
WBC: 8.5 10*3/uL (ref 3.9–10.3)
lymph#: 2.4 10*3/uL (ref 0.9–3.3)
nRBC: 0 % (ref 0–0)

## 2010-12-04 ENCOUNTER — Telehealth: Payer: Self-pay

## 2010-12-04 NOTE — Telephone Encounter (Signed)
Message left on pt's personalized VM per Dr Cyndie Chime -   "Platelets normal; continue Promacta."  dph

## 2010-12-10 ENCOUNTER — Ambulatory Visit: Payer: BC Managed Care – PPO

## 2010-12-10 ENCOUNTER — Other Ambulatory Visit: Payer: Self-pay | Admitting: Oncology

## 2010-12-10 ENCOUNTER — Ambulatory Visit (HOSPITAL_BASED_OUTPATIENT_CLINIC_OR_DEPARTMENT_OTHER): Payer: BC Managed Care – PPO

## 2010-12-10 ENCOUNTER — Other Ambulatory Visit: Payer: BC Managed Care – PPO | Admitting: Lab

## 2010-12-10 DIAGNOSIS — D693 Immune thrombocytopenic purpura: Secondary | ICD-10-CM

## 2010-12-10 DIAGNOSIS — C50419 Malignant neoplasm of upper-outer quadrant of unspecified female breast: Secondary | ICD-10-CM

## 2010-12-10 LAB — CBC WITH DIFFERENTIAL/PLATELET
BASO%: 0.7 % (ref 0.0–2.0)
Basophils Absolute: 0 10*3/uL (ref 0.0–0.1)
EOS%: 5.8 % (ref 0.0–7.0)
Eosinophils Absolute: 0.3 10*3/uL (ref 0.0–0.5)
HCT: 40.3 % (ref 34.8–46.6)
HGB: 13.1 g/dL (ref 11.6–15.9)
LYMPH%: 32 % (ref 14.0–49.7)
MCH: 29.1 pg (ref 25.1–34.0)
MCHC: 32.5 g/dL (ref 31.5–36.0)
MCV: 89.6 fL (ref 79.5–101.0)
MONO#: 0.8 10*3/uL (ref 0.1–0.9)
MONO%: 14.5 % — ABNORMAL HIGH (ref 0.0–14.0)
NEUT#: 2.6 10*3/uL (ref 1.5–6.5)
NEUT%: 47 % (ref 38.4–76.8)
Platelets: 81 10*3/uL — ABNORMAL LOW (ref 145–400)
RBC: 4.5 10*6/uL (ref 3.70–5.45)
RDW: 14.6 % — ABNORMAL HIGH (ref 11.2–14.5)
WBC: 5.6 10*3/uL (ref 3.9–10.3)
lymph#: 1.8 10*3/uL (ref 0.9–3.3)

## 2010-12-15 NOTE — Progress Notes (Signed)
Received fax from [employer] that pt's Promacta shipped on 12/10.  dph

## 2010-12-17 ENCOUNTER — Other Ambulatory Visit (HOSPITAL_BASED_OUTPATIENT_CLINIC_OR_DEPARTMENT_OTHER): Payer: BC Managed Care – PPO | Admitting: Lab

## 2010-12-17 ENCOUNTER — Ambulatory Visit: Payer: BC Managed Care – PPO

## 2010-12-17 DIAGNOSIS — D693 Immune thrombocytopenic purpura: Secondary | ICD-10-CM

## 2010-12-17 LAB — CBC WITH DIFFERENTIAL/PLATELET
BASO%: 2.6 % — ABNORMAL HIGH (ref 0.0–2.0)
Basophils Absolute: 0.2 10*3/uL — ABNORMAL HIGH (ref 0.0–0.1)
EOS%: 5 % (ref 0.0–7.0)
Eosinophils Absolute: 0.3 10*3/uL (ref 0.0–0.5)
HCT: 38.1 % (ref 34.8–46.6)
HGB: 12.6 g/dL (ref 11.6–15.9)
LYMPH%: 31.1 % (ref 14.0–49.7)
MCH: 28.3 pg (ref 25.1–34.0)
MCHC: 33.1 g/dL (ref 31.5–36.0)
MCV: 85.4 fL (ref 79.5–101.0)
MONO#: 1 10*3/uL — ABNORMAL HIGH (ref 0.1–0.9)
MONO%: 15.9 % — ABNORMAL HIGH (ref 0.0–14.0)
NEUT#: 2.8 10*3/uL (ref 1.5–6.5)
NEUT%: 45.4 % (ref 38.4–76.8)
Platelets: 80 10*3/uL — ABNORMAL LOW (ref 145–400)
RBC: 4.46 10*6/uL (ref 3.70–5.45)
RDW: 14.7 % — ABNORMAL HIGH (ref 11.2–14.5)
WBC: 6.2 10*3/uL (ref 3.9–10.3)
lymph#: 1.9 10*3/uL (ref 0.9–3.3)
nRBC: 0 % (ref 0–0)

## 2010-12-18 ENCOUNTER — Telehealth: Payer: Self-pay

## 2010-12-18 NOTE — Telephone Encounter (Signed)
Message left on pt's personalized VM reporting lab results per Dr Cyndie Chime.   Also informed pt that lab appts have been cancelled until 01/14/11 per MD request.   Instructed pt to contact our office and confirm she received this message. dph

## 2010-12-18 NOTE — Telephone Encounter (Signed)
Message copied by Albertha Ghee on Fri Dec 18, 2010 10:20 AM ------      Message from: Levert Feinstein      Created: Thu Dec 17, 2010 12:57 PM       Call pt platelets 80,000 - stable - can go on Q 4 wk lab check  DrG

## 2010-12-24 ENCOUNTER — Ambulatory Visit: Payer: BC Managed Care – PPO

## 2010-12-24 ENCOUNTER — Other Ambulatory Visit: Payer: BC Managed Care – PPO | Admitting: Lab

## 2010-12-31 ENCOUNTER — Ambulatory Visit: Payer: BC Managed Care – PPO

## 2010-12-31 ENCOUNTER — Other Ambulatory Visit: Payer: BC Managed Care – PPO | Admitting: Lab

## 2011-01-07 ENCOUNTER — Ambulatory Visit: Payer: BC Managed Care – PPO

## 2011-01-07 ENCOUNTER — Other Ambulatory Visit: Payer: BC Managed Care – PPO | Admitting: Lab

## 2011-01-14 ENCOUNTER — Ambulatory Visit: Payer: BC Managed Care – PPO

## 2011-01-14 ENCOUNTER — Other Ambulatory Visit (HOSPITAL_BASED_OUTPATIENT_CLINIC_OR_DEPARTMENT_OTHER): Payer: BC Managed Care – PPO | Admitting: Lab

## 2011-01-14 DIAGNOSIS — D693 Immune thrombocytopenic purpura: Secondary | ICD-10-CM

## 2011-01-14 LAB — CBC WITH DIFFERENTIAL/PLATELET
BASO%: 1.1 % (ref 0.0–2.0)
Basophils Absolute: 0.1 10*3/uL (ref 0.0–0.1)
EOS%: 1.5 % (ref 0.0–7.0)
Eosinophils Absolute: 0.1 10*3/uL (ref 0.0–0.5)
HCT: 37.4 % (ref 34.8–46.6)
HGB: 12.4 g/dL (ref 11.6–15.9)
LYMPH%: 30.6 % (ref 14.0–49.7)
MCH: 29.4 pg (ref 25.1–34.0)
MCHC: 33.3 g/dL (ref 31.5–36.0)
MCV: 88.4 fL (ref 79.5–101.0)
MONO#: 1.3 10*3/uL — ABNORMAL HIGH (ref 0.1–0.9)
MONO%: 16.9 % — ABNORMAL HIGH (ref 0.0–14.0)
NEUT#: 3.8 10*3/uL (ref 1.5–6.5)
NEUT%: 49.9 % (ref 38.4–76.8)
Platelets: 99 10*3/uL — ABNORMAL LOW (ref 145–400)
RBC: 4.24 10*6/uL (ref 3.70–5.45)
RDW: 14.8 % — ABNORMAL HIGH (ref 11.2–14.5)
WBC: 7.6 10*3/uL (ref 3.9–10.3)
lymph#: 2.3 10*3/uL (ref 0.9–3.3)

## 2011-01-15 ENCOUNTER — Encounter: Payer: Self-pay | Admitting: *Deleted

## 2011-01-15 NOTE — Progress Notes (Signed)
Received fax confirmation of promacta shipment on 01/14/11 for delivery next business day.

## 2011-01-18 ENCOUNTER — Telehealth: Payer: Self-pay | Admitting: Oncology

## 2011-01-18 ENCOUNTER — Ambulatory Visit (HOSPITAL_BASED_OUTPATIENT_CLINIC_OR_DEPARTMENT_OTHER): Payer: BC Managed Care – PPO | Admitting: Oncology

## 2011-01-18 VITALS — BP 127/77 | HR 65 | Temp 99.5°F | Ht 66.5 in | Wt 155.4 lb

## 2011-01-18 DIAGNOSIS — C50919 Malignant neoplasm of unspecified site of unspecified female breast: Secondary | ICD-10-CM

## 2011-01-18 DIAGNOSIS — F341 Dysthymic disorder: Secondary | ICD-10-CM

## 2011-01-18 DIAGNOSIS — D693 Immune thrombocytopenic purpura: Secondary | ICD-10-CM

## 2011-01-18 NOTE — Progress Notes (Signed)
Hematology and Oncology Follow Up Visit  Yvonne Hoover [id number] 08-17-1960 51 y.o. 01/18/2011 6:42 PM   Principle Diagnosis: Encounter Diagnoses  Name Primary?  . Chronic ITP (idiopathic thrombocytopenia) Yes  . Breast cancer, stage 1      Interim History:   Followup visit for this 51 year old woman with stage I he are positive breast cancer and chronic ITP. She did start on Promacta in September. She was tapered off N-Plate. Platelet counts have been in a very acceptable range. Platelet count done last week on January 10 was 99,000. The last dose of end plate was given on November 1. She has noted no side effects from the new drug. Baseline liver tests were normal. I have confirmed through the pharmaceutical company that have been know post marketing problems with the liver toxicity  despite the initial reports during the clinical trials. Menstrual cycles are becoming irregular but she is still having them on the tamoxifen.   Medications: reviewed  Allergies: No Known Allergies  Review of Systems: Constitutional:   No constitutional symptoms Respiratory: No dyspnea no cough Cardiovascular:  No chest pain or palpitations Gastrointestinal: No abdominal pain or change in bowel habit Genito-Urinary: Irregular menses Musculoskeletal: No muscle or bone pain Neurologic: Occasional atypical migraines with flashing lights but usually no headache Skin: No rash or ecchymoses Remaining ROS negative.  Physical Exam: Blood pressure 127/77, pulse 65, temperature 99.5 F (37.5 C), temperature source Oral, height 5' 6.5" (1.689 m), weight 155 lb 6.4 oz (70.489 kg). Wt Readings from Last 3 Encounters:  01/18/11 155 lb 6.4 oz (70.489 kg)  11/19/10 152 lb (68.947 kg)  11/12/10 152 lb (68.947 kg)     General appearance: Well-nourished Caucasian woman HENNT: Normal Lymph nodes: No adenopathy Breasts: Status post lumpectomy with removal of right nipple areolar complex no dominant mass in  either breast Lungs: Clear to auscultation resonant to percussion Heart: Regular cardiac rhythm no murmur Abdomen: Soft nontender status post splenectomy Extremities: No edema no calf tenderness Vascular: No cyanosis Neurologic: No focal deficit Skin: No rash or ecchymosis  Lab Results: Lab Results  Component Value Date   WBC 7.6 01/14/2011   HGB 12.4 01/14/2011   HCT 37.4 01/14/2011   MCV 88.4 01/14/2011   PLT 99* 01/14/2011     Chemistry      Component Value Date/Time   NA 141 11/05/2010 1344   NA 141 11/05/2010 1344   K 4.0 11/05/2010 1344   K 4.0 11/05/2010 1344   CL 107 11/05/2010 1344   CL 107 11/05/2010 1344   CO2 22 11/05/2010 1344   CO2 22 11/05/2010 1344   BUN 12 11/05/2010 1344   BUN 12 11/05/2010 1344   CREATININE 0.89 11/05/2010 1344   CREATININE 0.89 11/05/2010 1344      Component Value Date/Time   CALCIUM 9.0 11/05/2010 1344   CALCIUM 9.0 11/05/2010 1344   ALKPHOS 41 11/05/2010 1344   ALKPHOS 41 11/05/2010 1344   AST 19 11/05/2010 1344   AST 19 11/05/2010 1344   ALT 13 11/05/2010 1344   ALT 13 11/05/2010 1344   BILITOT 0.3 11/05/2010 1344   BILITOT 0.3 11/05/2010 1344       Radiological Studies: Mammogram due this month   Impression and Plan: #1. Chronic ITP. Initial diagnosis July 2007. Refractory to steroids. No response to Rituxan. 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 Good response to end plate given between may 2009 and November 2012. Now responding to Promacta. Plan continue the same. I  will decrease frequency of lab checks to every 2 months. Monitor liver functions quarterly.  #2. Stage I ER positive cancer of the right breast diagnosed March 2009 treated with lumpectomy radiation and currently on tamoxifen hormonal therapy plan continue the same  #3. Chronic anxiety and depression. Overall mood seems much better these days and some of her medical problems come under better control.  CC:. Yvonne Hoover; Jeannett Senior Hux; Claud Kelp   Levert Feinstein, MD 1/14/20136:42 PM

## 2011-01-18 NOTE — Telephone Encounter (Signed)
gv pt appt schedule for feb thru june

## 2011-02-08 ENCOUNTER — Other Ambulatory Visit (HOSPITAL_BASED_OUTPATIENT_CLINIC_OR_DEPARTMENT_OTHER): Payer: BC Managed Care – PPO | Admitting: Lab

## 2011-02-08 DIAGNOSIS — D693 Immune thrombocytopenic purpura: Secondary | ICD-10-CM

## 2011-02-08 DIAGNOSIS — C50919 Malignant neoplasm of unspecified site of unspecified female breast: Secondary | ICD-10-CM

## 2011-02-08 LAB — CBC WITH DIFFERENTIAL/PLATELET
BASO%: 1.3 % (ref 0.0–2.0)
Basophils Absolute: 0.1 10*3/uL (ref 0.0–0.1)
EOS%: 2.7 % (ref 0.0–7.0)
Eosinophils Absolute: 0.2 10*3/uL (ref 0.0–0.5)
HCT: 36.5 % (ref 34.8–46.6)
HGB: 12.2 g/dL (ref 11.6–15.9)
LYMPH%: 25.4 % (ref 14.0–49.7)
MCH: 29.4 pg (ref 25.1–34.0)
MCHC: 33.4 g/dL (ref 31.5–36.0)
MCV: 87.9 fL (ref 79.5–101.0)
MONO#: 0.8 10*3/uL (ref 0.1–0.9)
MONO%: 13.5 % (ref 0.0–14.0)
NEUT#: 3.5 10*3/uL (ref 1.5–6.5)
NEUT%: 57.1 % (ref 38.4–76.8)
Platelets: 124 10*3/uL — ABNORMAL LOW (ref 145–400)
RBC: 4.15 10*6/uL (ref 3.70–5.45)
RDW: 14.8 % — ABNORMAL HIGH (ref 11.2–14.5)
WBC: 6.1 10*3/uL (ref 3.9–10.3)
lymph#: 1.6 10*3/uL (ref 0.9–3.3)

## 2011-02-08 LAB — COMPREHENSIVE METABOLIC PANEL
ALT: 9 U/L (ref 0–35)
AST: 19 U/L (ref 0–37)
Albumin: 3.7 g/dL (ref 3.5–5.2)
Alkaline Phosphatase: 35 U/L — ABNORMAL LOW (ref 39–117)
BUN: 8 mg/dL (ref 6–23)
CO2: 27 mEq/L (ref 19–32)
Calcium: 8.4 mg/dL (ref 8.4–10.5)
Chloride: 108 mEq/L (ref 96–112)
Creatinine, Ser: 0.74 mg/dL (ref 0.50–1.10)
Glucose, Bld: 83 mg/dL (ref 70–99)
Potassium: 4 mEq/L (ref 3.5–5.3)
Sodium: 141 mEq/L (ref 135–145)
Total Bilirubin: 0.5 mg/dL (ref 0.3–1.2)
Total Protein: 5.7 g/dL — ABNORMAL LOW (ref 6.0–8.3)

## 2011-02-15 NOTE — Progress Notes (Signed)
Order signed by Dr Cyndie Chime faxed to [facility] for annual bilateral diagnostic mammogram. dph

## 2011-02-22 ENCOUNTER — Telehealth: Payer: Self-pay

## 2011-02-22 DIAGNOSIS — D693 Immune thrombocytopenic purpura: Secondary | ICD-10-CM

## 2011-02-22 MED ORDER — CEPHALEXIN 500 MG PO CAPS: 500.0000 mg | ORAL_CAPSULE | Freq: Three times a day (TID) | ORAL | Status: AC

## 2011-02-22 NOTE — Telephone Encounter (Signed)
Per Dr Cyndie Chime -   "Keflex 500mg  po TID x 7 days; Start at the first sign of infection. Call MD for Temp >101.  Pneumovax Q5 years Otherwise no special precautions."  Left this information on pt's personalized VM and instructed her to contact office with her pharmacy information. dph

## 2011-02-22 NOTE — Telephone Encounter (Signed)
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 dph

## 2011-03-16 ENCOUNTER — Encounter: Payer: Self-pay | Admitting: *Deleted

## 2011-03-16 NOTE — Progress Notes (Signed)
RECEIVED A FAX FROM [employer] CONCERNING A CONFIRMATION OF PRESCRIPTION SHIPMENT FOR PROMACTA 

## 2011-03-19 ENCOUNTER — Encounter: Payer: Self-pay | Admitting: Oncology

## 2011-03-26 ENCOUNTER — Telehealth: Payer: Self-pay

## 2011-03-26 NOTE — Telephone Encounter (Signed)
Pt notified per Dr Cyndie Chime  - "very unlikely to be a clot."  Instructed pt to contact on call MD if she worsens over the weekend and to take OTC anti-inflammatories for pain.  Pt to contact our office on Monday with an update.   Pt comfortable with this plan and verbalizes appreciation. dph

## 2011-03-26 NOTE — Telephone Encounter (Signed)
Received call from pt stating that she woke up this am with "intense right hip pain."  Pt denies injury "that I can recall."  Pt states, "Since I'm on Promacta, I wanted to be sure Dr Cyndie Chime doesn't think it's a blood clot."  Denies swelling, redness, temperature change.  Reports the pain worsens with weight bearing and is relieved by rest and "stretching."  Pt has not tried OTC meds, and doesn't wish to take anything, "just want to know it's nothing serious."    Note to Dr Cyndie Chime. dph

## 2011-03-31 ENCOUNTER — Encounter (INDEPENDENT_AMBULATORY_CARE_PROVIDER_SITE_OTHER): Payer: Self-pay

## 2011-04-12 ENCOUNTER — Other Ambulatory Visit: Payer: BC Managed Care – PPO | Admitting: Lab

## 2011-04-13 ENCOUNTER — Telehealth: Payer: Self-pay | Admitting: *Deleted

## 2011-04-13 ENCOUNTER — Other Ambulatory Visit (HOSPITAL_BASED_OUTPATIENT_CLINIC_OR_DEPARTMENT_OTHER): Payer: BC Managed Care – PPO | Admitting: Lab

## 2011-04-13 DIAGNOSIS — D693 Immune thrombocytopenic purpura: Secondary | ICD-10-CM

## 2011-04-13 LAB — CBC WITH DIFFERENTIAL/PLATELET
BASO%: 3.2 % — ABNORMAL HIGH (ref 0.0–2.0)
Basophils Absolute: 0.1 10*3/uL (ref 0.0–0.1)
EOS%: 5.7 % (ref 0.0–7.0)
Eosinophils Absolute: 0.3 10*3/uL (ref 0.0–0.5)
HCT: 36.7 % (ref 34.8–46.6)
HGB: 11.9 g/dL (ref 11.6–15.9)
LYMPH%: 30.3 % (ref 14.0–49.7)
MCH: 28.8 pg (ref 25.1–34.0)
MCHC: 32.5 g/dL (ref 31.5–36.0)
MCV: 88.6 fL (ref 79.5–101.0)
MONO#: 0.8 10*3/uL (ref 0.1–0.9)
MONO%: 18.1 % — ABNORMAL HIGH (ref 0.0–14.0)
NEUT#: 2 10*3/uL (ref 1.5–6.5)
NEUT%: 42.7 % (ref 38.4–76.8)
Platelets: 148 10*3/uL (ref 145–400)
RBC: 4.14 10*6/uL (ref 3.70–5.45)
RDW: 15.5 % — ABNORMAL HIGH (ref 11.2–14.5)
WBC: 4.6 10*3/uL (ref 3.9–10.3)
lymph#: 1.4 10*3/uL (ref 0.9–3.3)
nRBC: 0 % (ref 0–0)

## 2011-04-13 NOTE — Telephone Encounter (Signed)
Message copied by Sabino Snipes on Tue Apr 13, 2011  4:52 PM ------      Message from: Levert Feinstein      Created: Tue Apr 13, 2011 11:57 AM       Call pt platelets 148,000!

## 2011-04-13 NOTE — Telephone Encounter (Signed)
Message left on identified VM of plt. results per Dr. Cyndie Chime.

## 2011-04-15 ENCOUNTER — Encounter: Payer: Self-pay | Admitting: *Deleted

## 2011-04-15 NOTE — Progress Notes (Signed)
RECEIVED A FAX FROM [employer] CONCERNING A CONFIRMATION OF PRESCRIPTION SHIPMENT FOR PROMACTA 

## 2011-05-11 ENCOUNTER — Other Ambulatory Visit: Payer: Self-pay | Admitting: *Deleted

## 2011-05-11 DIAGNOSIS — D693 Immune thrombocytopenic purpura: Secondary | ICD-10-CM

## 2011-05-11 MED ORDER — ELTROMBOPAG OLAMINE 50 MG PO TABS: 50.0000 mg | ORAL_TABLET | Freq: Every day | ORAL | Status: DC

## 2011-05-19 ENCOUNTER — Other Ambulatory Visit: Payer: Self-pay | Admitting: *Deleted

## 2011-05-19 NOTE — Telephone Encounter (Signed)
Received confirmation fax stating that promacta script has been shipped & scheduled for delivery on next business day.

## 2011-06-07 ENCOUNTER — Ambulatory Visit (HOSPITAL_BASED_OUTPATIENT_CLINIC_OR_DEPARTMENT_OTHER): Payer: BC Managed Care – PPO | Admitting: Nurse Practitioner

## 2011-06-07 ENCOUNTER — Telehealth: Payer: Self-pay | Admitting: Oncology

## 2011-06-07 ENCOUNTER — Other Ambulatory Visit (HOSPITAL_BASED_OUTPATIENT_CLINIC_OR_DEPARTMENT_OTHER): Payer: BC Managed Care – PPO | Admitting: Lab

## 2011-06-07 VITALS — BP 119/67 | HR 59 | Temp 97.1°F | Ht 66.5 in | Wt 149.1 lb

## 2011-06-07 DIAGNOSIS — D693 Immune thrombocytopenic purpura: Secondary | ICD-10-CM

## 2011-06-07 DIAGNOSIS — F341 Dysthymic disorder: Secondary | ICD-10-CM

## 2011-06-07 DIAGNOSIS — C50919 Malignant neoplasm of unspecified site of unspecified female breast: Secondary | ICD-10-CM

## 2011-06-07 DIAGNOSIS — C50419 Malignant neoplasm of upper-outer quadrant of unspecified female breast: Secondary | ICD-10-CM

## 2011-06-07 DIAGNOSIS — M26629 Arthralgia of temporomandibular joint, unspecified side: Secondary | ICD-10-CM

## 2011-06-07 LAB — CBC WITH DIFFERENTIAL/PLATELET
BASO%: 2.8 % — ABNORMAL HIGH (ref 0.0–2.0)
Basophils Absolute: 0.2 10*3/uL — ABNORMAL HIGH (ref 0.0–0.1)
EOS%: 1.6 % (ref 0.0–7.0)
Eosinophils Absolute: 0.1 10*3/uL (ref 0.0–0.5)
HCT: 36.4 % (ref 34.8–46.6)
HGB: 11.8 g/dL (ref 11.6–15.9)
LYMPH%: 25.1 % (ref 14.0–49.7)
MCH: 28.8 pg (ref 25.1–34.0)
MCHC: 32.5 g/dL (ref 31.5–36.0)
MCV: 88.5 fL (ref 79.5–101.0)
MONO#: 1.1 10*3/uL — ABNORMAL HIGH (ref 0.1–0.9)
MONO%: 15.7 % — ABNORMAL HIGH (ref 0.0–14.0)
NEUT#: 3.8 10*3/uL (ref 1.5–6.5)
NEUT%: 54.8 % (ref 38.4–76.8)
Platelets: 168 10*3/uL (ref 145–400)
RBC: 4.11 10*6/uL (ref 3.70–5.45)
RDW: 15.1 % — ABNORMAL HIGH (ref 11.2–14.5)
WBC: 6.9 10*3/uL (ref 3.9–10.3)
lymph#: 1.7 10*3/uL (ref 0.9–3.3)
nRBC: 0 % (ref 0–0)

## 2011-06-07 NOTE — Telephone Encounter (Signed)
appts made and printed for pt aom °

## 2011-06-07 NOTE — Progress Notes (Signed)
OFFICE PROGRESS NOTE  Interval history:  Yvonne Hoover is a 51 year old woman with a history of stage I ER positive breast cancer on tamoxifen and chronic ITP on Promacta. She is seen today for scheduled followup.  Yvonne Hoover reports that she feels well. Aside from a monthly menstrual cycle she denies bleeding. She continues to have hot flashes. She has a good appetite. No bowel or bladder problems. Her only complaint today is a several day history of intermittent pain at the right temporomandibular joint.    Objective: Blood pressure 119/67, pulse 59, temperature 97.1 F (36.2 C), temperature source Oral, height 5' 6.5" (1.689 m), weight 149 lb 1.6 oz (67.631 kg).  Oropharynx is without thrush or ulceration. No palpable cervical, supraclavicular or axillary lymph nodes. Status post right lumpectomy with removal of the nipple/aerolar complex. No mass palpated in either breast. Lungs clear. Regular cardiac rhythm. Abdomen is soft and nontender. No organomegaly. Extremities are without edema. Calves are soft and nontender.  Lab Results: Lab Results  Component Value Date   WBC 6.9 06/07/2011   HGB 11.8 06/07/2011   HCT 36.4 06/07/2011   MCV 88.5 06/07/2011   PLT 168 06/07/2011    Chemistry:    Chemistry      Component Value Date/Time   NA 141 02/08/2011 1408   K 4.0 02/08/2011 1408   CL 108 02/08/2011 1408   CO2 27 02/08/2011 1408   BUN 8 02/08/2011 1408   CREATININE 0.74 02/08/2011 1408      Component Value Date/Time   CALCIUM 8.4 02/08/2011 1408   ALKPHOS 35* 02/08/2011 1408   AST 19 02/08/2011 1408   ALT 9 02/08/2011 1408   BILITOT 0.5 02/08/2011 1408       Studies/Results: No results found.  Medications: I have reviewed the patient's current medications.  Assessment/Plan:  1. Chronic ITP. Initial diagnosis July 2007. Refractory to steroids. No response to Rituxan. 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 Good response to Nplate given between May 2009 and November 2012. She is now on  Promacta with a continued response. 2. Stage I ER positive cancer of the right breast diagnosed March 2009 status post lumpectomy, radiation and currently on tamoxifen. She is up-to-date on mammograms. 3. Chronic anxiety and depression.  Disposition-Yvonne Hoover continues tamoxifen and Promacta. We will continue to check CBCs on a 2 month schedule and liver functions every 4 months. She will return for a followup visit in 6 months. She will contact the office in the interim with any problems.  Lonna Cobb ANP/GNP-BC

## 2011-06-09 ENCOUNTER — Telehealth: Payer: Self-pay | Admitting: *Deleted

## 2011-06-09 NOTE — Telephone Encounter (Signed)
Message copied by Sabino Snipes on Wed Jun 09, 2011  1:24 PM ------      Message from: Levert Feinstein      Created: Wed Jun 09, 2011  8:39 AM       Call pt platelets 168,000  Stay on Promacta

## 2011-06-09 NOTE — Telephone Encounter (Signed)
Pt notified via identified vm of platelet result & reported to cont promacta per Dr. Cyndie Chime.

## 2011-06-18 ENCOUNTER — Encounter: Payer: Self-pay | Admitting: *Deleted

## 2011-06-18 NOTE — Progress Notes (Signed)
RECEIVED A FAX FROM [employer] CONCERNING A CONFIRMATION OF PRESCRIPTION SHIPMENT FOR PROMACTA 

## 2011-07-19 ENCOUNTER — Encounter: Payer: Self-pay | Admitting: *Deleted

## 2011-07-19 NOTE — Progress Notes (Signed)
RECEIVED A FAX FROM [employer] CONCERNING A CONFIRMATION OF PRESCRIPTION SHIPMENT FOR PROMACTA 

## 2011-07-20 ENCOUNTER — Other Ambulatory Visit: Payer: Self-pay | Admitting: *Deleted

## 2011-07-20 ENCOUNTER — Telehealth: Payer: Self-pay | Admitting: *Deleted

## 2011-07-20 DIAGNOSIS — D693 Immune thrombocytopenic purpura: Secondary | ICD-10-CM

## 2011-07-20 NOTE — Telephone Encounter (Signed)
Received vm call from pt asking if she could come in for plt check.  She reports that she is going out of town for a week & leaving this thurs.  She reports "a little bruising going on although no petechia or bleeding gums or anything like that.  I guess it's just my nervous nature"  Message left on pt's vm to call scheduler to arrange time she wants to come.  Order in & routed to scheduler.

## 2011-07-23 ENCOUNTER — Telehealth: Payer: Self-pay | Admitting: Oncology

## 2011-07-23 NOTE — Telephone Encounter (Signed)
lmonvm for pt re appt for lb 7/22. Not able to reach pt to come in for lb today. Lb scheduled per 7/16 order.

## 2011-07-26 ENCOUNTER — Other Ambulatory Visit: Payer: BC Managed Care – PPO | Admitting: Lab

## 2011-08-09 ENCOUNTER — Other Ambulatory Visit (HOSPITAL_BASED_OUTPATIENT_CLINIC_OR_DEPARTMENT_OTHER): Payer: BC Managed Care – PPO | Admitting: Lab

## 2011-08-09 DIAGNOSIS — D693 Immune thrombocytopenic purpura: Secondary | ICD-10-CM

## 2011-08-09 LAB — COMPREHENSIVE METABOLIC PANEL
ALT: 16 U/L (ref 0–35)
AST: 25 U/L (ref 0–37)
Albumin: 3.9 g/dL (ref 3.5–5.2)
Alkaline Phosphatase: 44 U/L (ref 39–117)
BUN: 11 mg/dL (ref 6–23)
CO2: 28 mEq/L (ref 19–32)
Calcium: 9.1 mg/dL (ref 8.4–10.5)
Chloride: 103 mEq/L (ref 96–112)
Creatinine, Ser: 0.75 mg/dL (ref 0.50–1.10)
Glucose, Bld: 86 mg/dL (ref 70–99)
Potassium: 4.1 mEq/L (ref 3.5–5.3)
Sodium: 137 mEq/L (ref 135–145)
Total Bilirubin: 0.6 mg/dL (ref 0.3–1.2)
Total Protein: 6.4 g/dL (ref 6.0–8.3)

## 2011-08-09 LAB — CBC WITH DIFFERENTIAL/PLATELET
BASO%: 1.8 % (ref 0.0–2.0)
Basophils Absolute: 0.1 10*3/uL (ref 0.0–0.1)
EOS%: 1.5 % (ref 0.0–7.0)
Eosinophils Absolute: 0.1 10*3/uL (ref 0.0–0.5)
HCT: 39.3 % (ref 34.8–46.6)
HGB: 12.8 g/dL (ref 11.6–15.9)
LYMPH%: 26.6 % (ref 14.0–49.7)
MCH: 28.7 pg (ref 25.1–34.0)
MCHC: 32.6 g/dL (ref 31.5–36.0)
MCV: 87.9 fL (ref 79.5–101.0)
MONO#: 0.8 10*3/uL (ref 0.1–0.9)
MONO%: 13.5 % (ref 0.0–14.0)
NEUT#: 3.6 10*3/uL (ref 1.5–6.5)
NEUT%: 56.6 % (ref 38.4–76.8)
Platelets: 106 10*3/uL — ABNORMAL LOW (ref 145–400)
RBC: 4.47 10*6/uL (ref 3.70–5.45)
RDW: 15.3 % — ABNORMAL HIGH (ref 11.2–14.5)
WBC: 6.3 10*3/uL (ref 3.9–10.3)
lymph#: 1.7 10*3/uL (ref 0.9–3.3)

## 2011-08-10 ENCOUNTER — Telehealth: Payer: Self-pay | Admitting: *Deleted

## 2011-08-10 NOTE — Telephone Encounter (Signed)
Called patient.  Let her know that platelets were down some to 106K.  Dr. Cyndie Chime said not to worry, but to stay on promacta.  Patient is fine with this.

## 2011-08-20 ENCOUNTER — Encounter: Payer: Self-pay | Admitting: *Deleted

## 2011-08-20 NOTE — Progress Notes (Signed)
RECEIVED A FAX FROM [employer] CONCERNING A CONFIRMATION OF PRESCRIPTION SHIPMENT FOR PROMACTA ON 08/19/11.

## 2011-09-23 ENCOUNTER — Other Ambulatory Visit: Payer: Self-pay | Admitting: *Deleted

## 2011-09-23 NOTE — Telephone Encounter (Signed)
Promacta shipped 09/22/11

## 2011-09-28 ENCOUNTER — Other Ambulatory Visit: Payer: Self-pay | Admitting: Oncology

## 2011-10-11 ENCOUNTER — Other Ambulatory Visit (HOSPITAL_BASED_OUTPATIENT_CLINIC_OR_DEPARTMENT_OTHER): Payer: BC Managed Care – PPO | Admitting: Lab

## 2011-10-11 DIAGNOSIS — D693 Immune thrombocytopenic purpura: Secondary | ICD-10-CM

## 2011-10-11 LAB — CBC WITH DIFFERENTIAL/PLATELET
BASO%: 1.4 % (ref 0.0–2.0)
Basophils Absolute: 0.1 10*3/uL (ref 0.0–0.1)
EOS%: 1.4 % (ref 0.0–7.0)
Eosinophils Absolute: 0.1 10*3/uL (ref 0.0–0.5)
HCT: 37.9 % (ref 34.8–46.6)
HGB: 12.4 g/dL (ref 11.6–15.9)
LYMPH%: 22.5 % (ref 14.0–49.7)
MCH: 28.7 pg (ref 25.1–34.0)
MCHC: 32.8 g/dL (ref 31.5–36.0)
MCV: 87.5 fL (ref 79.5–101.0)
MONO#: 1.2 10*3/uL — ABNORMAL HIGH (ref 0.1–0.9)
MONO%: 14.1 % — ABNORMAL HIGH (ref 0.0–14.0)
NEUT#: 5.2 10*3/uL (ref 1.5–6.5)
NEUT%: 60.6 % (ref 38.4–76.8)
Platelets: 123 10*3/uL — ABNORMAL LOW (ref 145–400)
RBC: 4.33 10*6/uL (ref 3.70–5.45)
RDW: 15.4 % — ABNORMAL HIGH (ref 11.2–14.5)
WBC: 8.7 10*3/uL (ref 3.9–10.3)
lymph#: 1.9 10*3/uL (ref 0.9–3.3)

## 2011-10-27 ENCOUNTER — Telehealth: Payer: Self-pay | Admitting: *Deleted

## 2011-10-27 NOTE — Telephone Encounter (Signed)
[facility] faxed confirmation of prescription shipment.  Promacta was shipped 10-26-2011 with next business day delivery.

## 2011-11-21 ENCOUNTER — Other Ambulatory Visit: Payer: Self-pay | Admitting: Oncology

## 2011-11-24 ENCOUNTER — Encounter: Payer: Self-pay | Admitting: *Deleted

## 2011-11-24 NOTE — Progress Notes (Signed)
Rec'd fax from [facility] that Promacta Rx was shipped on 11/23/11 for next day delivery.

## 2011-12-13 ENCOUNTER — Other Ambulatory Visit: Payer: BC Managed Care – PPO | Admitting: Lab

## 2011-12-17 ENCOUNTER — Other Ambulatory Visit: Payer: Self-pay | Admitting: *Deleted

## 2011-12-17 DIAGNOSIS — D693 Immune thrombocytopenic purpura: Secondary | ICD-10-CM

## 2011-12-17 MED ORDER — ELTROMBOPAG OLAMINE 50 MG PO TABS: 50.0000 mg | ORAL_TABLET | Freq: Every day | ORAL | Status: DC

## 2011-12-17 NOTE — Telephone Encounter (Signed)
RECEIVED A FAX FROM [employer] CONCERNING A CONFIRMATION OF FACSIMILE RECEIPT FOR PT. REFERRAL. INSURANCE COVERAGE WILL BE VERIFIED AND DELIVERY ARRANGEMENTS MADE FOR PATIENT.

## 2011-12-17 NOTE — Telephone Encounter (Signed)
THIS REFILL REQUEST FOR PROMACTA WAS PLACED IN DR.GRANFORTUNA'S ACTIVE WORK FOLDER.

## 2011-12-20 ENCOUNTER — Other Ambulatory Visit (HOSPITAL_BASED_OUTPATIENT_CLINIC_OR_DEPARTMENT_OTHER): Payer: BC Managed Care – PPO | Admitting: Lab

## 2011-12-20 ENCOUNTER — Telehealth: Payer: Self-pay | Admitting: Oncology

## 2011-12-20 ENCOUNTER — Ambulatory Visit (HOSPITAL_BASED_OUTPATIENT_CLINIC_OR_DEPARTMENT_OTHER): Payer: BC Managed Care – PPO | Admitting: Oncology

## 2011-12-20 VITALS — BP 147/64 | HR 60 | Temp 98.0°F | Resp 20 | Ht 66.5 in | Wt 146.4 lb

## 2011-12-20 DIAGNOSIS — D693 Immune thrombocytopenic purpura: Secondary | ICD-10-CM

## 2011-12-20 DIAGNOSIS — Z170 Estrogen receptor positive status [ER+]: Secondary | ICD-10-CM

## 2011-12-20 DIAGNOSIS — C50419 Malignant neoplasm of upper-outer quadrant of unspecified female breast: Secondary | ICD-10-CM

## 2011-12-20 DIAGNOSIS — C50919 Malignant neoplasm of unspecified site of unspecified female breast: Secondary | ICD-10-CM

## 2011-12-20 LAB — COMPREHENSIVE METABOLIC PANEL (CC13)
ALT: 19 U/L (ref 0–55)
AST: 24 U/L (ref 5–34)
Albumin: 3.2 g/dL — ABNORMAL LOW (ref 3.5–5.0)
Alkaline Phosphatase: 55 U/L (ref 40–150)
BUN: 12 mg/dL (ref 7.0–26.0)
CO2: 30 mEq/L — ABNORMAL HIGH (ref 22–29)
Calcium: 9.1 mg/dL (ref 8.4–10.4)
Chloride: 106 mEq/L (ref 98–107)
Creatinine: 0.7 mg/dL (ref 0.6–1.1)
Glucose: 81 mg/dl (ref 70–99)
Potassium: 4.1 mEq/L (ref 3.5–5.1)
Sodium: 141 mEq/L (ref 136–145)
Total Bilirubin: 0.61 mg/dL (ref 0.20–1.20)
Total Protein: 6.6 g/dL (ref 6.4–8.3)

## 2011-12-20 LAB — CBC WITH DIFFERENTIAL/PLATELET
BASO%: 1.5 % (ref 0.0–2.0)
Basophils Absolute: 0.1 10*3/uL (ref 0.0–0.1)
EOS%: 3.4 % (ref 0.0–7.0)
Eosinophils Absolute: 0.2 10*3/uL (ref 0.0–0.5)
HCT: 38.1 % (ref 34.8–46.6)
HGB: 12.5 g/dL (ref 11.6–15.9)
LYMPH%: 22.9 % (ref 14.0–49.7)
MCH: 28.7 pg (ref 25.1–34.0)
MCHC: 32.9 g/dL (ref 31.5–36.0)
MCV: 87.2 fL (ref 79.5–101.0)
MONO#: 0.4 10*3/uL (ref 0.1–0.9)
MONO%: 8.8 % (ref 0.0–14.0)
NEUT#: 3.1 10*3/uL (ref 1.5–6.5)
NEUT%: 63.4 % (ref 38.4–76.8)
Platelets: 117 10*3/uL — ABNORMAL LOW (ref 145–400)
RBC: 4.37 10*6/uL (ref 3.70–5.45)
RDW: 15.5 % — ABNORMAL HIGH (ref 11.2–14.5)
WBC: 4.9 10*3/uL (ref 3.9–10.3)
lymph#: 1.1 10*3/uL (ref 0.9–3.3)

## 2011-12-20 NOTE — Patient Instructions (Signed)
Change lab testing to every 2 months next Feb 14, 2012

## 2011-12-20 NOTE — Telephone Encounter (Signed)
appts made and printed for pt aom °

## 2011-12-21 ENCOUNTER — Telehealth: Payer: Self-pay | Admitting: *Deleted

## 2011-12-21 NOTE — Telephone Encounter (Signed)
Patient called.  She was here yesterday, felt fine, saw Dr. Cyndie Chime.  Then yesterday afternoon, she came down with aches, chills, fever and cough.  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   Discussed with Dr. Cyndie Chime.... Probably a viral illness, needs to rest, plenty of fluids, can use cough drops, decongestant - treat sx.  Discussed with patient.  She is home from work today and off tomorrow.  She will watch sx and call PCP or [place] if sx not controlled. - ie high fever, productive cough etc.

## 2011-12-21 NOTE — Progress Notes (Signed)
Hematology and Oncology Follow Up Visit  Yvonne Hoover [id number] 11/21/1960 51 y.o. 12/21/2011 7:55 AM   Principle Diagnosis: Encounter Diagnoses  Name Primary?  . Breast cancer, stage 1 Yes  . 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 She was put on N-plate given between May 2009 and November 2012 and had a excellent response. She was changed to [facility] in September 2012. She has also achieved a sustained response with platelet count consistently over 100,000 on this medication.  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 She was treated with lumpectomy. Tumor was close to the nipple areolar complex which was removed with the surgery. She received postop radiation. She was put on tamoxifen hormonal therapy which she continues at this time.  Her son, who is 70 years old,  recently developed a seizure disorder. He required hospitalization at [facility] in [place]. He had a very difficult time but has finally stabilized and is back to work. Mrs. Weitzman realized how important her children are to her and she is so grateful that her son is now back in good health that it has really transformed her chronic depression and put her illness in perspective.  She gets occasional headaches, nothing severe or progressive. Platelets consistently over 100,000 so she has had no bleeding issues. About a year ago she had a endometrial biopsy for dysfunctional bleeding and the biopsy was negative. She reports no recent dysfunctional bleeding. She is now on tamoxifen for 5 years. We discussed the results of mature data presented last year  at the [facility] breast cancer meetings subsequently published in full text which supports ten-year use of tamoxifen if this is the only hormonal agent being used.      Medications: reviewed  Allergies: No Known Allergies  Review of Systems: Constitutional:   No constitutional symptoms Respiratory: No cough or dyspnea Cardiovascular:  No chest pain or palpitations Gastrointestinal: No abdominal pain or change in bowel habit Genito-Urinary: See above Musculoskeletal: No bone pain Neurologic: See above Skin: No rash or ecchymosis Remaining ROS negative.  Physical Exam: Blood pressure 147/64, pulse 60, temperature 98 F (36.7 C), temperature source Oral, resp. rate 20, height 5' 6.5" (1.689 m), weight 146 lb 6.4 oz (66.407 kg). Wt Readings from Last 3 Encounters:  12/20/11 146 lb 6.4 oz (66.407 kg)  06/07/11 149 lb 1.6 oz (67.631 kg)  01/18/11 155 lb 6.4 oz (70.489 kg)     General appearance: Well-nourished Caucasian woman HENNT: Pharynx no erythema or exudate Lymph nodes: No cervical, supraclavicular, or axillary adenopathy Breasts: Right breast surgical changes with absent nipple areolar complex. No dominant mass in either breast Lungs: Clear to auscultation resonant to percussion Heart: Regular rhythm no murmur or gallop Abdomen: Soft, nontender, status post splenectomy, no hepatomegaly. Extremities: No edema, no calf tenderness Vascular: No cyanosis Neurologic: She is alert and oriented, cranial nerves grossly normal, motor strength 5 over 5, reflexes 1+ symmetric PERRLA Skin: No rash or ecchymosis  Lab Results: Lab Results  Component Value Date   WBC 4.9 12/20/2011   HGB 12.5 12/20/2011   HCT 38.1 12/20/2011   MCV 87.2 12/20/2011   PLT  117* 12/20/2011     Chemistry      Component Value Date/Time   NA 141 12/20/2011 1050   NA 137 08/09/2011 1101   K 4.1 12/20/2011 1050   K 4.1 08/09/2011 1101   CL 106 12/20/2011 1050   CL 103 08/09/2011 1101   CO2 30*  12/20/2011 1050   CO2 28 08/09/2011 1101   BUN 12.0 12/20/2011 1050   BUN 11 08/09/2011 1101   CREATININE 0.7 12/20/2011 1050   CREATININE 0.75 08/09/2011 1101      Component Value Date/Time   CALCIUM 9.1 12/20/2011 1050   CALCIUM 9.1 08/09/2011 1101   ALKPHOS 55 12/20/2011 1050   ALKPHOS 44 08/09/2011 1101   AST 24 12/20/2011 1050   AST 25 08/09/2011 1101   ALT 19 12/20/2011 1050   ALT 16 08/09/2011 1101   BILITOT 0.61 12/20/2011 1050   BILITOT 0.6 08/09/2011 1101       Radiological Studies: No results found.  Impression and Plan: #1. Chronic ITP Stable on Promacta. Plan continue Promacta indefinitely. Periodic monitoring of her CBCs and liver functions. I will decrease CBCs to every other month chemistry profiles every quarter.  #2. Stage I, estrogen receptor positive, invasive carcinoma right breast treated as outlined above. She remains free of any obvious new disease now out almost 5 years from diagnosis in March of 2009. Plan: Continue tamoxifen  #3. Chronic anxiety and depression. Much improved with stabilization of her medical condition and that of her family.   CC:. Dr. Viviann Spare Hux; Dr. Leda Quail; Dr. Caprice Renshaw, MD 12/17/20137:55 AM

## 2011-12-23 ENCOUNTER — Telehealth: Payer: Self-pay | Admitting: *Deleted

## 2011-12-23 NOTE — Telephone Encounter (Signed)
Received call from pt stating that she called tues with fever, aching, etc & fever went away but is back today.  She wants to know what she should do. Returned call to pt & she reports fever 100 degrees, no yellow-green secretions, just coughing, aching & fever.  She wants to know if Dr. Cyndie Chime would write her a note for work since she has called in 3 days.  She works at [employer].  Will discuss with Dr Cyndie Chime.

## 2011-12-24 ENCOUNTER — Telehealth: Payer: Self-pay | Admitting: *Deleted

## 2011-12-24 ENCOUNTER — Other Ambulatory Visit: Payer: Self-pay | Admitting: *Deleted

## 2011-12-24 DIAGNOSIS — J111 Influenza due to unidentified influenza virus with other respiratory manifestations: Secondary | ICD-10-CM

## 2011-12-24 MED ORDER — OSELTAMIVIR PHOSPHATE 75 MG PO CAPS: 75.0000 mg | ORAL_CAPSULE | Freq: Two times a day (BID) | ORAL | Status: DC

## 2011-12-24 NOTE — Telephone Encounter (Signed)
RECEIVED A FAX FROM [facility] CONCERNING A CONFIRMATION OF PRESCRIPTION SHIPMENT FOR PROMACTA ON 12/23/11.

## 2011-12-24 NOTE — Telephone Encounter (Signed)
Script written stating please excuse Ms. Bodkins from work beginning 12/16-12/24/13 due to influenza per Dr.Granfortuna.

## 2011-12-24 NOTE — Telephone Encounter (Signed)
Pt called back this am after message left yest to call back.  Tamiflu script called in & pt will p/u script for work excuse.

## 2012-01-27 ENCOUNTER — Encounter: Payer: Self-pay | Admitting: *Deleted

## 2012-01-27 NOTE — Progress Notes (Signed)
RECEIVED A FAX FROM [facility] CONCERNING A CONFIRMATION OF PRESCRIPTION SHIPMENT FOR PROMACTA ON 01/26/12.

## 2012-02-03 ENCOUNTER — Telehealth: Payer: Self-pay | Admitting: *Deleted

## 2012-02-03 NOTE — Telephone Encounter (Signed)
Pt called & left message reporting that she had her annual exam with her GYN yest & she felt a spot on her right breast & has scheduled at mammogram & report should follow to Dr Cyndie Chime. She stated that it had been 5 years since she was diagnosed.  Note to Dr. Cyndie Chime.

## 2012-02-09 ENCOUNTER — Encounter (INDEPENDENT_AMBULATORY_CARE_PROVIDER_SITE_OTHER): Payer: Self-pay

## 2012-02-14 ENCOUNTER — Other Ambulatory Visit: Payer: BC Managed Care – PPO | Admitting: Lab

## 2012-02-14 ENCOUNTER — Ambulatory Visit: Payer: BC Managed Care – PPO | Admitting: Oncology

## 2012-02-14 ENCOUNTER — Other Ambulatory Visit: Payer: BC Managed Care – PPO

## 2012-02-16 ENCOUNTER — Other Ambulatory Visit (HOSPITAL_BASED_OUTPATIENT_CLINIC_OR_DEPARTMENT_OTHER): Payer: BC Managed Care – PPO | Admitting: Lab

## 2012-02-16 DIAGNOSIS — C50919 Malignant neoplasm of unspecified site of unspecified female breast: Secondary | ICD-10-CM

## 2012-02-16 DIAGNOSIS — D693 Immune thrombocytopenic purpura: Secondary | ICD-10-CM

## 2012-02-16 DIAGNOSIS — C50419 Malignant neoplasm of upper-outer quadrant of unspecified female breast: Secondary | ICD-10-CM

## 2012-02-16 LAB — CBC WITH DIFFERENTIAL/PLATELET
BASO%: 1.6 % (ref 0.0–2.0)
Basophils Absolute: 0.1 10*3/uL (ref 0.0–0.1)
EOS%: 1.3 % (ref 0.0–7.0)
Eosinophils Absolute: 0.1 10*3/uL (ref 0.0–0.5)
HCT: 37.1 % (ref 34.8–46.6)
HGB: 12.2 g/dL (ref 11.6–15.9)
LYMPH%: 34.5 % (ref 14.0–49.7)
MCH: 29 pg (ref 25.1–34.0)
MCHC: 33 g/dL (ref 31.5–36.0)
MCV: 87.7 fL (ref 79.5–101.0)
MONO#: 1.2 10*3/uL — ABNORMAL HIGH (ref 0.1–0.9)
MONO%: 14.5 % — ABNORMAL HIGH (ref 0.0–14.0)
NEUT#: 3.9 10*3/uL (ref 1.5–6.5)
NEUT%: 48.1 % (ref 38.4–76.8)
Platelets: 48 10*3/uL — ABNORMAL LOW (ref 145–400)
RBC: 4.23 10*6/uL (ref 3.70–5.45)
RDW: 16.4 % — ABNORMAL HIGH (ref 11.2–14.5)
WBC: 8 10*3/uL (ref 3.9–10.3)
lymph#: 2.8 10*3/uL (ref 0.9–3.3)

## 2012-02-16 LAB — COMPREHENSIVE METABOLIC PANEL (CC13)
ALT: 12 U/L (ref 0–55)
AST: 20 U/L (ref 5–34)
Albumin: 3.2 g/dL — ABNORMAL LOW (ref 3.5–5.0)
Alkaline Phosphatase: 52 U/L (ref 40–150)
BUN: 10.2 mg/dL (ref 7.0–26.0)
CO2: 28 mEq/L (ref 22–29)
Calcium: 8.6 mg/dL (ref 8.4–10.4)
Chloride: 107 mEq/L (ref 98–107)
Creatinine: 0.7 mg/dL (ref 0.6–1.1)
Glucose: 83 mg/dl (ref 70–99)
Potassium: 4 mEq/L (ref 3.5–5.1)
Sodium: 140 mEq/L (ref 136–145)
Total Bilirubin: 0.32 mg/dL (ref 0.20–1.20)
Total Protein: 6.5 g/dL (ref 6.4–8.3)

## 2012-02-17 ENCOUNTER — Telehealth: Payer: Self-pay | Admitting: *Deleted

## 2012-02-17 DIAGNOSIS — D693 Immune thrombocytopenic purpura: Secondary | ICD-10-CM

## 2012-02-17 NOTE — Telephone Encounter (Signed)
Message copied by Sabino Snipes on Thu Feb 17, 2012  3:11 PM ------      Message from: Levert Feinstein      Created: Wed Feb 16, 2012  5:00 PM       Call pt - platelets 48,000.  Has she missed any doses of promacta? Need to repeat CBC in 1 week. ------

## 2012-02-17 NOTE — Telephone Encounter (Signed)
Called & left vm for pt to call back but also informed on identified vm of plt result & ? of missed doses of promacta.  POF to scheduler to repeat cbc in 1 wk.

## 2012-02-17 NOTE — Telephone Encounter (Signed)
Pt called back & states that she hasn't missed any doses of promacta but has drank a little heavier & has had a period recently & is going to have to have an endometrial bx per her GYN.  She couldn't think of anything new drug-wise but also reports being under some additional emotional stress.

## 2012-02-21 ENCOUNTER — Telehealth: Payer: Self-pay | Admitting: Oncology

## 2012-02-21 NOTE — Telephone Encounter (Signed)
Called pt and left message regarding labs  on 02/23/12

## 2012-02-23 ENCOUNTER — Other Ambulatory Visit: Payer: BC Managed Care – PPO | Admitting: Lab

## 2012-02-24 ENCOUNTER — Telehealth: Payer: Self-pay | Admitting: Oncology

## 2012-02-24 ENCOUNTER — Encounter: Payer: Self-pay | Admitting: *Deleted

## 2012-02-24 NOTE — Progress Notes (Signed)
RECEIVED A FAX FROM [facility] CONCERNING A CONFIRMATION OF PRESCRIPTION SHIPMENT FOR PROMACTA

## 2012-02-24 NOTE — Telephone Encounter (Signed)
pt called to r/s lab....Done °

## 2012-02-25 ENCOUNTER — Other Ambulatory Visit (HOSPITAL_BASED_OUTPATIENT_CLINIC_OR_DEPARTMENT_OTHER): Payer: BC Managed Care – PPO

## 2012-02-25 DIAGNOSIS — D693 Immune thrombocytopenic purpura: Secondary | ICD-10-CM

## 2012-02-25 DIAGNOSIS — C50419 Malignant neoplasm of upper-outer quadrant of unspecified female breast: Secondary | ICD-10-CM

## 2012-02-25 DIAGNOSIS — C50919 Malignant neoplasm of unspecified site of unspecified female breast: Secondary | ICD-10-CM

## 2012-02-25 LAB — CBC WITH DIFFERENTIAL/PLATELET
BASO%: 1.4 % (ref 0.0–2.0)
Basophils Absolute: 0.1 10*3/uL (ref 0.0–0.1)
EOS%: 2.5 % (ref 0.0–7.0)
Eosinophils Absolute: 0.2 10*3/uL (ref 0.0–0.5)
HCT: 35.1 % (ref 34.8–46.6)
HGB: 11.4 g/dL — ABNORMAL LOW (ref 11.6–15.9)
LYMPH%: 33.6 % (ref 14.0–49.7)
MCH: 28.7 pg (ref 25.1–34.0)
MCHC: 32.6 g/dL (ref 31.5–36.0)
MCV: 87.9 fL (ref 79.5–101.0)
MONO#: 1.2 10*3/uL — ABNORMAL HIGH (ref 0.1–0.9)
MONO%: 16 % — ABNORMAL HIGH (ref 0.0–14.0)
NEUT#: 3.3 10*3/uL (ref 1.5–6.5)
NEUT%: 46.5 % (ref 38.4–76.8)
Platelets: 154 10*3/uL (ref 145–400)
RBC: 3.99 10*6/uL (ref 3.70–5.45)
RDW: 16.4 % — ABNORMAL HIGH (ref 11.2–14.5)
WBC: 7.2 10*3/uL (ref 3.9–10.3)
lymph#: 2.4 10*3/uL (ref 0.9–3.3)

## 2012-02-29 ENCOUNTER — Telehealth: Payer: Self-pay | Admitting: *Deleted

## 2012-02-29 NOTE — Telephone Encounter (Signed)
Duplicate

## 2012-02-29 NOTE — Telephone Encounter (Signed)
Pt called and left message wanting to know if her Hgb level has changed much.  Called pt back and informed pt re:  Per Dr. Cyndie Chime,  plt counts up 154,000.   Hgb 11.4 from 02/25/12.   Pt was concerned about the drop in level from 11.6 a week ago to 11.4 from 02/25/12.   Reassured pt that md had reviewed lab results, and no further instructions.

## 2012-03-28 ENCOUNTER — Encounter: Payer: Self-pay | Admitting: *Deleted

## 2012-03-28 NOTE — Progress Notes (Signed)
RECEIVED A FAX FROM [facility] CONCERNING A CONFIRMATION OF PRESCRIPTION SHIPMENT FOR PROMACTA ON 03/27/12.

## 2012-04-10 ENCOUNTER — Encounter: Payer: Self-pay | Admitting: Certified Nurse Midwife

## 2012-04-10 ENCOUNTER — Other Ambulatory Visit (HOSPITAL_BASED_OUTPATIENT_CLINIC_OR_DEPARTMENT_OTHER): Payer: BC Managed Care – PPO | Admitting: Lab

## 2012-04-10 ENCOUNTER — Telehealth: Payer: Self-pay | Admitting: *Deleted

## 2012-04-10 ENCOUNTER — Ambulatory Visit (INDEPENDENT_AMBULATORY_CARE_PROVIDER_SITE_OTHER): Payer: BC Managed Care – PPO | Admitting: Certified Nurse Midwife

## 2012-04-10 VITALS — BP 126/70 | HR 64 | Resp 16

## 2012-04-10 DIAGNOSIS — N924 Excessive bleeding in the premenopausal period: Secondary | ICD-10-CM | POA: Insufficient documentation

## 2012-04-10 DIAGNOSIS — C50419 Malignant neoplasm of upper-outer quadrant of unspecified female breast: Secondary | ICD-10-CM

## 2012-04-10 DIAGNOSIS — C50919 Malignant neoplasm of unspecified site of unspecified female breast: Secondary | ICD-10-CM

## 2012-04-10 DIAGNOSIS — D693 Immune thrombocytopenic purpura: Secondary | ICD-10-CM

## 2012-04-10 LAB — CBC WITH DIFFERENTIAL/PLATELET
BASO%: 1.7 % (ref 0.0–2.0)
Basophils Absolute: 0.1 10*3/uL (ref 0.0–0.1)
EOS%: 1.7 % (ref 0.0–7.0)
Eosinophils Absolute: 0.1 10*3/uL (ref 0.0–0.5)
HCT: 38.4 % (ref 34.8–46.6)
HGB: 12.5 g/dL (ref 11.6–15.9)
LYMPH%: 24.5 % (ref 14.0–49.7)
MCH: 28.9 pg (ref 25.1–34.0)
MCHC: 32.5 g/dL (ref 31.5–36.0)
MCV: 89 fL (ref 79.5–101.0)
MONO#: 1.1 10*3/uL — ABNORMAL HIGH (ref 0.1–0.9)
MONO%: 14.4 % — ABNORMAL HIGH (ref 0.0–14.0)
NEUT#: 4.4 10*3/uL (ref 1.5–6.5)
NEUT%: 57.7 % (ref 38.4–76.8)
Platelets: 135 10*3/uL — ABNORMAL LOW (ref 145–400)
RBC: 4.31 10*6/uL (ref 3.70–5.45)
RDW: 14.8 % — ABNORMAL HIGH (ref 11.2–14.5)
WBC: 7.7 10*3/uL (ref 3.9–10.3)
lymph#: 1.9 10*3/uL (ref 0.9–3.3)

## 2012-04-10 NOTE — Telephone Encounter (Signed)
Received vm call from pt stating that she had a period in Jan & again today & is having some heavy bleeding.  She has an appt with her GYN today but wonders if she needs to come in for a plt count.  She reports no petechia or bruising.  Called pt & she will check with her GYN-Dr. Sara Chu to see if they can do a cbc in her office.

## 2012-04-10 NOTE — Progress Notes (Deleted)
52 y.o.DivorcedCaucasian female [id number] with LMP April 09, 2012 here complaing of  break through bleeding with no contraceptive. No new medications. Describes break through bleeding occurring on {Numbers; 1-31 (date):30403196} days  of cycle, duration {Numbers; 1-31 (date):30403196} days. Flow is ****       Cramping {YES NO:22349} relieved with OTC medication {YES NO:22349} or patient comfort measures.  Vital Signs:  BP           Pulse   Weight:    O: Healthy WN WD female   Alert oriented x3 Skin: normal Thyroid:{EXAM; THYROID:18604} Pelvic exam: {Exam; pelvic:16939} if indicated  A:     P:
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
## 2012-04-11 ENCOUNTER — Other Ambulatory Visit: Payer: Self-pay | Admitting: *Deleted

## 2012-04-11 DIAGNOSIS — C50919 Malignant neoplasm of unspecified site of unspecified female breast: Secondary | ICD-10-CM

## 2012-04-11 MED ORDER — TAMOXIFEN CITRATE 20 MG PO TABS: ORAL_TABLET | ORAL | Status: DC

## 2012-04-12 ENCOUNTER — Telehealth: Payer: Self-pay | Admitting: *Deleted

## 2012-04-12 NOTE — Telephone Encounter (Signed)
Message copied by Orbie Hurst on Wed Apr 12, 2012  3:09 PM ------      Message from: Levert Feinstein      Created: Mon Apr 10, 2012  3:25 PM       Call pt platelets OK at 135,000 ------

## 2012-04-12 NOTE — Telephone Encounter (Signed)
Spoke with patient .  Let her know that platelets are ok at 135K.  She appreciated my call.

## 2012-04-14 LAB — HEMOGLOBIN, FINGERSTICK: Hemoglobin, fingerstick: 11.8 g/dL — ABNORMAL LOW (ref 12.0–16.0)

## 2012-04-17 ENCOUNTER — Other Ambulatory Visit: Payer: BC Managed Care – PPO | Admitting: Lab

## 2012-04-17 ENCOUNTER — Ambulatory Visit: Payer: BC Managed Care – PPO | Admitting: Oncology

## 2012-04-17 ENCOUNTER — Telehealth: Payer: Self-pay | Admitting: *Deleted

## 2012-04-17 NOTE — Telephone Encounter (Signed)
Patient called and canceled appts for today. She wants to reschedule. The line got disconnected. I have called her back and left a message that we will call her back with appts. DEsk RN notified.   JMW

## 2012-04-19 ENCOUNTER — Other Ambulatory Visit: Payer: Self-pay | Admitting: Oncology

## 2012-04-20 ENCOUNTER — Telehealth: Payer: Self-pay | Admitting: Oncology

## 2012-05-15 ENCOUNTER — Other Ambulatory Visit: Payer: Self-pay | Admitting: Oncology

## 2012-05-15 NOTE — Telephone Encounter (Signed)
Spoke with patient.  Pt. States her pharmacy wasn't sure who prescribed celexa.   Let her know that pharmacy does have records of who prescribed it.  Also if she checks her bottle she will see. She looked and it was not Dr.Granfortuna.  Let her know it may be more appropriate for her PCP to prescribe.  She will contact her pharmacy.  In the meantime, rejected request from [facility] to refill and let them know to contact her PCP.

## 2012-05-30 ENCOUNTER — Telehealth: Payer: Self-pay | Admitting: *Deleted

## 2012-05-30 NOTE — Telephone Encounter (Signed)
Pt called & left vm that she had missed 2 days of her promacta due to playing telephone tag & the memorial day holiday.  She missed yest & today & should get her delivery tomorrow.  She wondered if she should come in for a lab check or get a shot.  Discussed with Dr Truett Perna & Lonna Cobb NP & informed pt that this should not be a problem & to cont promacta but call if bleeding or bruising.  She expressed understanding.

## 2012-05-31 ENCOUNTER — Encounter: Payer: Self-pay | Admitting: *Deleted

## 2012-05-31 NOTE — Progress Notes (Signed)
Faxed notification from [facility] that promacta was shipped on 05/30/12 for next day delivery.

## 2012-06-15 ENCOUNTER — Other Ambulatory Visit (HOSPITAL_BASED_OUTPATIENT_CLINIC_OR_DEPARTMENT_OTHER): Payer: BC Managed Care – PPO | Admitting: Lab

## 2012-06-15 ENCOUNTER — Telehealth: Payer: Self-pay | Admitting: Oncology

## 2012-06-15 ENCOUNTER — Ambulatory Visit (HOSPITAL_BASED_OUTPATIENT_CLINIC_OR_DEPARTMENT_OTHER): Payer: BC Managed Care – PPO | Admitting: Nurse Practitioner

## 2012-06-15 VITALS — BP 135/59 | HR 62 | Temp 98.4°F | Resp 18 | Ht 66.5 in | Wt 149.8 lb

## 2012-06-15 DIAGNOSIS — C50911 Malignant neoplasm of unspecified site of right female breast: Secondary | ICD-10-CM

## 2012-06-15 DIAGNOSIS — C50919 Malignant neoplasm of unspecified site of unspecified female breast: Secondary | ICD-10-CM

## 2012-06-15 DIAGNOSIS — D693 Immune thrombocytopenic purpura: Secondary | ICD-10-CM

## 2012-06-15 DIAGNOSIS — C50419 Malignant neoplasm of upper-outer quadrant of unspecified female breast: Secondary | ICD-10-CM

## 2012-06-15 DIAGNOSIS — Z170 Estrogen receptor positive status [ER+]: Secondary | ICD-10-CM

## 2012-06-15 LAB — CBC WITH DIFFERENTIAL/PLATELET
BASO%: 1.3 % (ref 0.0–2.0)
Basophils Absolute: 0.1 10*3/uL (ref 0.0–0.1)
EOS%: 1.1 % (ref 0.0–7.0)
Eosinophils Absolute: 0.1 10*3/uL (ref 0.0–0.5)
HCT: 39.2 % (ref 34.8–46.6)
HGB: 13.1 g/dL (ref 11.6–15.9)
LYMPH%: 32.8 % (ref 14.0–49.7)
MCH: 29.4 pg (ref 25.1–34.0)
MCHC: 33.4 g/dL (ref 31.5–36.0)
MCV: 88.1 fL (ref 79.5–101.0)
MONO#: 1 10*3/uL — ABNORMAL HIGH (ref 0.1–0.9)
MONO%: 11.8 % (ref 0.0–14.0)
NEUT#: 4.5 10*3/uL (ref 1.5–6.5)
NEUT%: 53 % (ref 38.4–76.8)
Platelets: 59 10*3/uL — ABNORMAL LOW (ref 145–400)
RBC: 4.45 10*6/uL (ref 3.70–5.45)
RDW: 15.7 % — ABNORMAL HIGH (ref 11.2–14.5)
WBC: 8.4 10*3/uL (ref 3.9–10.3)
lymph#: 2.8 10*3/uL (ref 0.9–3.3)

## 2012-06-15 LAB — TECHNOLOGIST REVIEW

## 2012-06-15 NOTE — Telephone Encounter (Signed)
gv and printed appt sched and avs for pt  °

## 2012-06-15 NOTE — Progress Notes (Signed)
OFFICE PROGRESS NOTE  Interval history:   Ms. Tunks is a 52 year old woman with a history of stage I ER positive breast cancer on tamoxifen and chronic ITP on Promacta. She is seen today for scheduled followup.  She continues tamoxifen and Promacta. She denies bleeding. No unusual headaches. No vision change. She has periodic hot flashes.   Objective: Blood pressure 135/59, pulse 62, temperature 98.4 F (36.9 C), temperature source Oral, resp. rate 18, height 5' 6.5" (1.689 m), weight 149 lb 12.8 oz (67.949 kg).  Oropharynx is without thrush or ulceration. No palpable cervical, supraclavicular or axillary lymph nodes. Status post right lumpectomy. No mass palpated in either breast. Lungs are clear. Regular cardiac rhythm. Abdomen soft and nontender. No organomegaly. Extremities are without edema. Calves soft and nontender.  Lab Results: Lab Results  Component Value Date   WBC 8.4 06/15/2012   HGB 13.1 06/15/2012   HCT 39.2 06/15/2012   MCV 88.1 06/15/2012   PLT 59* 06/15/2012    Chemistry:    Chemistry      Component Value Date/Time   NA 140 02/16/2012 1436   NA 137 08/09/2011 1101   K 4.0 02/16/2012 1436   K 4.1 08/09/2011 1101   CL 107 02/16/2012 1436   CL 103 08/09/2011 1101   CO2 28 02/16/2012 1436   CO2 28 08/09/2011 1101   BUN 10.2 02/16/2012 1436   BUN 11 08/09/2011 1101   CREATININE 0.7 02/16/2012 1436   CREATININE 0.75 08/09/2011 1101      Component Value Date/Time   CALCIUM 8.6 02/16/2012 1436   CALCIUM 9.1 08/09/2011 1101   ALKPHOS 52 02/16/2012 1436   ALKPHOS 44 08/09/2011 1101   AST 20 02/16/2012 1436   AST 25 08/09/2011 1101   ALT 12 02/16/2012 1436   ALT 16 08/09/2011 1101   BILITOT 0.32 02/16/2012 1436   BILITOT 0.6 08/09/2011 1101       Studies/Results: No results found.  Medications: I have reviewed the patient's current medications.  Assessment/Plan:  1. Chronic ITP. Initial diagnosis July 2007. Refractory to steroids. No response to Rituxan. Status post splenectomy  11/05/2009 with no response. Good response to Nplate given between May 2009 and November 2012. She is now on Promacta with a continued response. 2. Stage I ER positive cancer of the right breast diagnosed March 2009 status post lumpectomy, radiation and currently on tamoxifen. She is up-to-date on mammograms. 3. Chronic anxiety and depression. At today's visit she reported she is tapering herself off of Celexa. I encouraged her to call her primary physician to notify them of her plan and for instructions.  Disposition-she continues tamoxifen and Promacta. The platelet count returned at 59,000 today. We will have her return for a repeat CBC at a 4 week interval. If the platelet count is back to baseline we will return to the every 2 month schedule. She will return for a followup visit in 6 months. She will contact the office in the interim with any problems. We specifically discussed bleeding.  Lonna Cobb ANP/GNP-BC

## 2012-06-29 ENCOUNTER — Telehealth: Payer: Self-pay | Admitting: *Deleted

## 2012-06-29 NOTE — Telephone Encounter (Signed)
[facility] sent facsimile confirmation of Promacta prescription shipment.  Was shipped 06-27-2012 with next business day delivery.

## 2012-07-13 ENCOUNTER — Other Ambulatory Visit (HOSPITAL_BASED_OUTPATIENT_CLINIC_OR_DEPARTMENT_OTHER): Payer: BC Managed Care – PPO

## 2012-07-13 DIAGNOSIS — C50919 Malignant neoplasm of unspecified site of unspecified female breast: Secondary | ICD-10-CM

## 2012-07-13 DIAGNOSIS — D693 Immune thrombocytopenic purpura: Secondary | ICD-10-CM

## 2012-07-13 LAB — CBC WITH DIFFERENTIAL/PLATELET
BASO%: 1.7 % (ref 0.0–2.0)
Basophils Absolute: 0.1 10*3/uL (ref 0.0–0.1)
EOS%: 1.8 % (ref 0.0–7.0)
Eosinophils Absolute: 0.1 10*3/uL (ref 0.0–0.5)
HCT: 38.1 % (ref 34.8–46.6)
HGB: 12.7 g/dL (ref 11.6–15.9)
LYMPH%: 25.2 % (ref 14.0–49.7)
MCH: 29.3 pg (ref 25.1–34.0)
MCHC: 33.3 g/dL (ref 31.5–36.0)
MCV: 87.8 fL (ref 79.5–101.0)
MONO#: 1 10*3/uL — ABNORMAL HIGH (ref 0.1–0.9)
MONO%: 14.8 % — ABNORMAL HIGH (ref 0.0–14.0)
NEUT#: 3.7 10*3/uL (ref 1.5–6.5)
NEUT%: 56.5 % (ref 38.4–76.8)
Platelets: 70 10*3/uL — ABNORMAL LOW (ref 145–400)
RBC: 4.34 10*6/uL (ref 3.70–5.45)
RDW: 15.2 % — ABNORMAL HIGH (ref 11.2–14.5)
WBC: 6.6 10*3/uL (ref 3.9–10.3)
lymph#: 1.7 10*3/uL (ref 0.9–3.3)
nRBC: 0 % (ref 0–0)

## 2012-07-21 ENCOUNTER — Other Ambulatory Visit: Payer: Self-pay | Admitting: *Deleted

## 2012-07-21 DIAGNOSIS — D693 Immune thrombocytopenic purpura: Secondary | ICD-10-CM

## 2012-07-21 MED ORDER — ELTROMBOPAG OLAMINE 50 MG PO TABS: 50.0000 mg | ORAL_TABLET | Freq: Every day | ORAL | Status: DC

## 2012-07-21 NOTE — Telephone Encounter (Signed)
THIS REFILL REQUEST FOR PROMACTA WAS PLACED IN DR.GRANFORTUNA'S ACTIVE WORK BOX.

## 2012-07-25 ENCOUNTER — Telehealth: Payer: Self-pay | Admitting: *Deleted

## 2012-07-25 ENCOUNTER — Telehealth: Payer: Self-pay | Admitting: Certified Nurse Midwife

## 2012-07-25 NOTE — Telephone Encounter (Signed)
Per Dr. Tresa Res note that if only occurs twice per year, no management needed. This is the second time of bleeding from documented in chart, is perimenopausal and could be from fibroid.  If occurs again will need OV, or if she feels this is not the second time needs ov.  Give excessive bleeding warning signs.

## 2012-07-25 NOTE — Telephone Encounter (Signed)
Is currently on Tamoxifen and is reporting that she is getting a period "every few months."  Patient last saw Debi in January or February.and Debi had asked her to let us know if her periods resumed.

## 2012-07-25 NOTE — Telephone Encounter (Signed)
Agree with plan to come in if occurs again

## 2012-07-25 NOTE — Telephone Encounter (Signed)
Patient notified of response from Ortencia Kick concerning bleeding again. Patient feels at this time she does not need office visit and will continue to monitor this. Patient to call back if cycle or bleeding becomes heavy, clotting in blood, urinary problems or weakness , tired. Patient understands this. Patient also reported she had called the oncologist nurse and was concerned this was occuring because of medication Tamoxifen. States her oncologist suggest she stop taking the Tamoxifen till the GYN says ok with the bleeding issue  She is having.  Suggested office visit with patient due to this statement and declined at this time.

## 2012-07-25 NOTE — Telephone Encounter (Signed)
See note below; patient states she is still on Tamoxifen.

## 2012-07-25 NOTE — Telephone Encounter (Signed)
Patient called to let us know that she was having her period.  She has them periodically and just wanted to let us know.  It is not excessive with clots.  She has a call into her GYN MD.  Let her know that because she is on the tamoxifen, that bleeding can indicate  a problem - which we would refer her to her GYN to be seen.  It is also complicated by the fact that she has ITP.  So she needs to let us know if she has excessive bleeding so we can check her platelets.

## 2012-07-25 NOTE — Telephone Encounter (Signed)
Called patient and after discussion with Dr. Cyndie Chime, he requested that she hold her tamoxifen until she sees her GYN.  Patient asks if she can resume her tamoxifen after she sees GYN and gets the ok that everything is ok..yesterday, she can resume after evaluation from GYN.

## 2012-07-25 NOTE — Telephone Encounter (Signed)
Returned call to patient. States could not remember when her last cycle was, thinking was in Jan. Or Feb. . Reminded patient of her last OV with D. Darcel Bayley was 04/10/2012 of heavy bleeding with clots. Patient did not remember it was then. Last AEX 02/01/2012 paper chart Last OV 04/10/2012  Epic with D.Leonard Patient states she started her cycle last nite. Today is pinkish to red color. Backache only. She thinks tomorrow it will be heavy . Patient seemed confused when trying to explain this information. Please advise. Chart on your desk.

## 2012-07-27 NOTE — Telephone Encounter (Signed)
RECEIVED A FAX FROM [facility] CONCERNING A CONFIRMATION OF PRESCRIPTION SHIPMENT FOR PROMACTA  ON 07/26/12.

## 2012-08-10 ENCOUNTER — Other Ambulatory Visit (HOSPITAL_BASED_OUTPATIENT_CLINIC_OR_DEPARTMENT_OTHER): Payer: BC Managed Care – PPO

## 2012-08-10 ENCOUNTER — Telehealth: Payer: Self-pay | Admitting: *Deleted

## 2012-08-10 DIAGNOSIS — D693 Immune thrombocytopenic purpura: Secondary | ICD-10-CM

## 2012-08-10 LAB — CBC WITH DIFFERENTIAL/PLATELET
BASO%: 1.9 % (ref 0.0–2.0)
Basophils Absolute: 0.1 10*3/uL (ref 0.0–0.1)
EOS%: 2.2 % (ref 0.0–7.0)
Eosinophils Absolute: 0.2 10*3/uL (ref 0.0–0.5)
HCT: 39.4 % (ref 34.8–46.6)
HGB: 13 g/dL (ref 11.6–15.9)
LYMPH%: 33.1 % (ref 14.0–49.7)
MCH: 29.3 pg (ref 25.1–34.0)
MCHC: 33 g/dL (ref 31.5–36.0)
MCV: 88.9 fL (ref 79.5–101.0)
MONO#: 0.9 10*3/uL (ref 0.1–0.9)
MONO%: 12.3 % (ref 0.0–14.0)
NEUT#: 3.5 10*3/uL (ref 1.5–6.5)
NEUT%: 50.5 % (ref 38.4–76.8)
Platelets: 121 10*3/uL — ABNORMAL LOW (ref 145–400)
RBC: 4.43 10*6/uL (ref 3.70–5.45)
RDW: 15.2 % — ABNORMAL HIGH (ref 11.2–14.5)
WBC: 7 10*3/uL (ref 3.9–10.3)
lymph#: 2.3 10*3/uL (ref 0.9–3.3)

## 2012-08-10 NOTE — Telephone Encounter (Signed)
Message copied by Gala Romney on Thu Aug 10, 2012  2:51 PM ------      Message from: Levert Feinstein      Created: Thu Aug 10, 2012  2:13 PM       Call pt platelets back up to 121,000 ------

## 2012-08-10 NOTE — Telephone Encounter (Signed)
Left message on known voice mail that platelets are up to 121 & to call office if questions.

## 2012-08-23 ENCOUNTER — Ambulatory Visit (INDEPENDENT_AMBULATORY_CARE_PROVIDER_SITE_OTHER): Payer: BC Managed Care – PPO | Admitting: Certified Nurse Midwife

## 2012-08-23 ENCOUNTER — Encounter: Payer: Self-pay | Admitting: Certified Nurse Midwife

## 2012-08-23 VITALS — BP 112/68 | HR 60 | Resp 16 | Ht 67.25 in | Wt 150.0 lb

## 2012-08-23 DIAGNOSIS — B373 Candidiasis of vulva and vagina: Secondary | ICD-10-CM

## 2012-08-23 MED ORDER — FLUCONAZOLE 150 MG PO TABS: 150.0000 mg | ORAL_TABLET | Freq: Once | ORAL | Status: DC

## 2012-08-23 NOTE — Progress Notes (Signed)
52 y.o. Single Caucasian female G2P2002 here with complaint of rash on inner thighs and in vaginal area. For about 2 weeks. No weeping from areas. Has tried some Mycolog cream she had left, helped some. Worried it was HPV. 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 She "sweats a lot while working".   O: Healthy WD,WN female Affect: normal, orientation x 3 Skin: small light pink fine macular rash on both inner thighs near panty line, no exudate, no lesions, no warty growth noted, not warm to touch, non tender  Pelvic exam:EXTERNAL GENITALIA: Vulva area red, with slight exudate, no lesions VAGINA: discharge noted, white, thick, ph 4.0  Wet Prep taken CERVIX: no lesions or cervical motion tenderness Perineal area: no lesions  Wet Prep: positive for yeast, negative for BV, trich  A:Yeast vaginitis and yeast dermatitis   P: Discussed findings of yeast and increase perspiration can contribute to occurrence. Encouraged loose light clothing, change underwear when damp.  Aveeno sitz bath prn comfort. Patient declines steroid cream will use OTC if needed. If not resolving or becoming more wide spread, needs to be seen either or dermatology.  Patient agreeable. Rx Diflucan see order  Rv prn

## 2012-08-25 ENCOUNTER — Encounter: Payer: Self-pay | Admitting: *Deleted

## 2012-08-25 NOTE — Progress Notes (Signed)
Note reviewed, agree with plan.  Edgardo Petrenko, MD  

## 2012-08-25 NOTE — Progress Notes (Signed)
RECEIVED A FAX FROM [facility] CONCERNING A CONFIRMATION OF PRESCRIPTION SHIPMENT FOR PROMACTA ON 08/24/12.

## 2012-09-07 ENCOUNTER — Other Ambulatory Visit (HOSPITAL_BASED_OUTPATIENT_CLINIC_OR_DEPARTMENT_OTHER): Payer: BC Managed Care – PPO

## 2012-09-07 DIAGNOSIS — D693 Immune thrombocytopenic purpura: Secondary | ICD-10-CM

## 2012-09-07 DIAGNOSIS — C50419 Malignant neoplasm of upper-outer quadrant of unspecified female breast: Secondary | ICD-10-CM

## 2012-09-07 DIAGNOSIS — C50919 Malignant neoplasm of unspecified site of unspecified female breast: Secondary | ICD-10-CM

## 2012-09-07 LAB — COMPREHENSIVE METABOLIC PANEL (CC13)
ALT: 14 U/L (ref 0–55)
AST: 23 U/L (ref 5–34)
Albumin: 3.4 g/dL — ABNORMAL LOW (ref 3.5–5.0)
Alkaline Phosphatase: 58 U/L (ref 40–150)
BUN: 11.9 mg/dL (ref 7.0–26.0)
CO2: 28 mEq/L (ref 22–29)
Calcium: 8.9 mg/dL (ref 8.4–10.4)
Chloride: 110 mEq/L — ABNORMAL HIGH (ref 98–109)
Creatinine: 0.7 mg/dL (ref 0.6–1.1)
Glucose: 63 mg/dl — ABNORMAL LOW (ref 70–140)
Potassium: 4.4 mEq/L (ref 3.5–5.1)
Sodium: 144 mEq/L (ref 136–145)
Total Bilirubin: 0.36 mg/dL (ref 0.20–1.20)
Total Protein: 6.9 g/dL (ref 6.4–8.3)

## 2012-09-07 LAB — CBC WITH DIFFERENTIAL/PLATELET
BASO%: 2.4 % — ABNORMAL HIGH (ref 0.0–2.0)
Basophils Absolute: 0.2 10*3/uL — ABNORMAL HIGH (ref 0.0–0.1)
EOS%: 3.1 % (ref 0.0–7.0)
Eosinophils Absolute: 0.2 10*3/uL (ref 0.0–0.5)
HCT: 39.4 % (ref 34.8–46.6)
HGB: 13 g/dL (ref 11.6–15.9)
LYMPH%: 33.6 % (ref 14.0–49.7)
MCH: 29.2 pg (ref 25.1–34.0)
MCHC: 33 g/dL (ref 31.5–36.0)
MCV: 88.5 fL (ref 79.5–101.0)
MONO#: 0.8 10*3/uL (ref 0.1–0.9)
MONO%: 12.4 % (ref 0.0–14.0)
NEUT#: 3 10*3/uL (ref 1.5–6.5)
NEUT%: 48.5 % (ref 38.4–76.8)
Platelets: 117 10*3/uL — ABNORMAL LOW (ref 145–400)
RBC: 4.45 10*6/uL (ref 3.70–5.45)
RDW: 14.9 % — ABNORMAL HIGH (ref 11.2–14.5)
WBC: 6.1 10*3/uL (ref 3.9–10.3)
lymph#: 2.1 10*3/uL (ref 0.9–3.3)

## 2012-09-07 LAB — TECHNOLOGIST REVIEW

## 2012-09-12 ENCOUNTER — Telehealth: Payer: Self-pay | Admitting: *Deleted

## 2012-09-12 NOTE — Telephone Encounter (Signed)
Returned call to pt after she had called this am.  Left vm reporting results of platlets per Dr Patsy Lager request on identified vm.

## 2012-09-12 NOTE — Telephone Encounter (Signed)
Message copied by Sabino Snipes on Tue Sep 12, 2012 12:44 PM ------      Message from: Levert Feinstein      Created: Thu Sep 07, 2012  4:34 PM       Call pt platelets stable ------

## 2012-09-28 ENCOUNTER — Encounter: Payer: Self-pay | Admitting: *Deleted

## 2012-09-28 NOTE — Progress Notes (Signed)
RECEIVED A FAX FROM [facility] CONCERNING A CONFIRMATION OF A PRESCRIPTION SHIPMENT FOR PROMACTA ON 09/27/12.

## 2012-09-29 ENCOUNTER — Other Ambulatory Visit: Payer: Self-pay | Admitting: Oncology

## 2012-10-05 ENCOUNTER — Other Ambulatory Visit: Payer: BC Managed Care – PPO

## 2012-10-06 ENCOUNTER — Other Ambulatory Visit (HOSPITAL_BASED_OUTPATIENT_CLINIC_OR_DEPARTMENT_OTHER): Payer: BC Managed Care – PPO

## 2012-10-06 DIAGNOSIS — C50419 Malignant neoplasm of upper-outer quadrant of unspecified female breast: Secondary | ICD-10-CM

## 2012-10-06 DIAGNOSIS — D693 Immune thrombocytopenic purpura: Secondary | ICD-10-CM

## 2012-10-06 LAB — CBC WITH DIFFERENTIAL/PLATELET
BASO%: 2.3 % — ABNORMAL HIGH (ref 0.0–2.0)
Basophils Absolute: 0.2 10*3/uL — ABNORMAL HIGH (ref 0.0–0.1)
EOS%: 1.7 % (ref 0.0–7.0)
Eosinophils Absolute: 0.1 10*3/uL (ref 0.0–0.5)
HCT: 40.7 % (ref 34.8–46.6)
HGB: 13.5 g/dL (ref 11.6–15.9)
LYMPH%: 30.6 % (ref 14.0–49.7)
MCH: 29.8 pg (ref 25.1–34.0)
MCHC: 33.1 g/dL (ref 31.5–36.0)
MCV: 90.1 fL (ref 79.5–101.0)
MONO#: 1.1 10*3/uL — ABNORMAL HIGH (ref 0.1–0.9)
MONO%: 13.8 % (ref 0.0–14.0)
NEUT#: 3.9 10*3/uL (ref 1.5–6.5)
NEUT%: 51.6 % (ref 38.4–76.8)
Platelets: 110 10*3/uL — ABNORMAL LOW (ref 145–400)
RBC: 4.52 10*6/uL (ref 3.70–5.45)
RDW: 15 % — ABNORMAL HIGH (ref 11.2–14.5)
WBC: 7.7 10*3/uL (ref 3.9–10.3)
lymph#: 2.3 10*3/uL (ref 0.9–3.3)

## 2012-11-02 ENCOUNTER — Telehealth: Payer: Self-pay | Admitting: Oncology

## 2012-11-02 ENCOUNTER — Other Ambulatory Visit: Payer: BC Managed Care – PPO | Admitting: Lab

## 2012-11-02 NOTE — Telephone Encounter (Signed)
pt called to r/s lab....done °

## 2012-11-08 ENCOUNTER — Other Ambulatory Visit: Payer: BC Managed Care – PPO

## 2012-11-10 ENCOUNTER — Telehealth: Payer: Self-pay | Admitting: *Deleted

## 2012-11-10 NOTE — Telephone Encounter (Signed)
Lm gv lab appt for 11/15/12 @ 8am...td

## 2012-11-15 ENCOUNTER — Other Ambulatory Visit (HOSPITAL_BASED_OUTPATIENT_CLINIC_OR_DEPARTMENT_OTHER): Payer: BC Managed Care – PPO | Admitting: Lab

## 2012-11-15 ENCOUNTER — Encounter (INDEPENDENT_AMBULATORY_CARE_PROVIDER_SITE_OTHER): Payer: Self-pay

## 2012-11-15 DIAGNOSIS — D693 Immune thrombocytopenic purpura: Secondary | ICD-10-CM

## 2012-11-15 LAB — CBC WITH DIFFERENTIAL/PLATELET
BASO%: 1.9 % (ref 0.0–2.0)
Basophils Absolute: 0.1 10*3/uL (ref 0.0–0.1)
EOS%: 4.7 % (ref 0.0–7.0)
Eosinophils Absolute: 0.3 10*3/uL (ref 0.0–0.5)
HCT: 41.2 % (ref 34.8–46.6)
HGB: 13.2 g/dL (ref 11.6–15.9)
LYMPH%: 27.1 % (ref 14.0–49.7)
MCH: 29.3 pg (ref 25.1–34.0)
MCHC: 32 g/dL (ref 31.5–36.0)
MCV: 91.8 fL (ref 79.5–101.0)
MONO#: 0.9 10*3/uL (ref 0.1–0.9)
MONO%: 13 % (ref 0.0–14.0)
NEUT#: 3.8 10*3/uL (ref 1.5–6.5)
NEUT%: 53.3 % (ref 38.4–76.8)
Platelets: 198 10*3/uL (ref 145–400)
RBC: 4.49 10*6/uL (ref 3.70–5.45)
RDW: 14.9 % — ABNORMAL HIGH (ref 11.2–14.5)
WBC: 7.1 10*3/uL (ref 3.9–10.3)
lymph#: 1.9 10*3/uL (ref 0.9–3.3)

## 2012-11-16 ENCOUNTER — Telehealth: Payer: Self-pay | Admitting: *Deleted

## 2012-11-16 NOTE — Telephone Encounter (Signed)
Message copied by Gala Romney on Thu Nov 16, 2012 10:40 AM ------      Message from: Levert Feinstein      Created: Wed Nov 15, 2012  9:06 PM       Call pt: platelets 198,000! Best ever ------

## 2012-11-16 NOTE — Telephone Encounter (Signed)
Per MD; left vm on cell# platelets 198--Best ever been and if any questions to call office.

## 2012-11-27 ENCOUNTER — Encounter: Payer: Self-pay | Admitting: *Deleted

## 2012-11-27 NOTE — Progress Notes (Signed)
RECEIVED A FAX FROM [employer]OLOG[facility] CONCERNING A CONFIRMATION OF PRESCRIPTION SHIPMENT FOR PROMACTA ON 11/24/12.

## 2012-12-01 ENCOUNTER — Other Ambulatory Visit: Payer: BC Managed Care – PPO

## 2012-12-18 ENCOUNTER — Telehealth: Payer: Self-pay | Admitting: Oncology

## 2012-12-18 ENCOUNTER — Encounter (INDEPENDENT_AMBULATORY_CARE_PROVIDER_SITE_OTHER): Payer: Self-pay

## 2012-12-18 ENCOUNTER — Other Ambulatory Visit (HOSPITAL_BASED_OUTPATIENT_CLINIC_OR_DEPARTMENT_OTHER): Payer: BC Managed Care – PPO

## 2012-12-18 ENCOUNTER — Ambulatory Visit (HOSPITAL_BASED_OUTPATIENT_CLINIC_OR_DEPARTMENT_OTHER): Payer: BC Managed Care – PPO | Admitting: Oncology

## 2012-12-18 VITALS — BP 135/76 | HR 66 | Temp 97.9°F | Resp 18 | Ht 67.25 in | Wt 151.4 lb

## 2012-12-18 DIAGNOSIS — C50519 Malignant neoplasm of lower-outer quadrant of unspecified female breast: Secondary | ICD-10-CM

## 2012-12-18 DIAGNOSIS — D693 Immune thrombocytopenic purpura: Secondary | ICD-10-CM

## 2012-12-18 DIAGNOSIS — F341 Dysthymic disorder: Secondary | ICD-10-CM

## 2012-12-18 DIAGNOSIS — C50911 Malignant neoplasm of unspecified site of right female breast: Secondary | ICD-10-CM

## 2012-12-18 DIAGNOSIS — C50912 Malignant neoplasm of unspecified site of left female breast: Secondary | ICD-10-CM

## 2012-12-18 LAB — CBC WITH DIFFERENTIAL/PLATELET
BASO%: 1.6 % (ref 0.0–2.0)
Basophils Absolute: 0.1 10*3/uL (ref 0.0–0.1)
EOS%: 1.2 % (ref 0.0–7.0)
Eosinophils Absolute: 0.1 10*3/uL (ref 0.0–0.5)
HCT: 38.8 % (ref 34.8–46.6)
HGB: 12.9 g/dL (ref 11.6–15.9)
LYMPH%: 28 % (ref 14.0–49.7)
MCH: 30.5 pg (ref 25.1–34.0)
MCHC: 33.2 g/dL (ref 31.5–36.0)
MCV: 92 fL (ref 79.5–101.0)
MONO#: 0.8 10*3/uL (ref 0.1–0.9)
MONO%: 11.1 % (ref 0.0–14.0)
NEUT#: 4 10*3/uL (ref 1.5–6.5)
NEUT%: 58.1 % (ref 38.4–76.8)
Platelets: 128 10*3/uL — ABNORMAL LOW (ref 145–400)
RBC: 4.22 10*6/uL (ref 3.70–5.45)
RDW: 14.5 % (ref 11.2–14.5)
WBC: 6.8 10*3/uL (ref 3.9–10.3)
lymph#: 1.9 10*3/uL (ref 0.9–3.3)

## 2012-12-18 NOTE — Telephone Encounter (Signed)
appts made per 12/15 POF Email Dr Reece Agar to find out about labs every other month Told pt I would call if she does need labs every other month AVs and CAL given shh

## 2012-12-19 ENCOUNTER — Telehealth: Payer: Self-pay | Admitting: Oncology

## 2012-12-19 NOTE — Telephone Encounter (Signed)
emailed with Dr G about Feb and April labs He had put in standing orders so scheduled labs and notified pt of same shh °

## 2012-12-19 NOTE — Progress Notes (Signed)
Hematology and Oncology Follow Up Visit  Yvonne Hoover [id number] [birth year] 52 y.o. 12/19/2012 8:21 AM   Principle Diagnosis: Encounter Diagnoses  Name Primary?  . Immune thrombocytopenic purpura Yes  . Breast cancer, stage 1, left   . 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 She was put on N-plate given between May 2009 and November 2012 and had a excellent response. She was changed to [facility] in September 2012. She has also achieved a sustained response with platelet count consistently over 100,000 on this medication.   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 She was treated with lumpectomy. Tumor was close to the nipple areolar complex which was removed with the surgery. She received postop radiation. She was put on tamoxifen hormonal therapy which she continues at this time.  She had an endometrial biopsy 10/29/2012 for dysfunctional uterine bleeding. No signs of malignancy. Tamoxifen resumed.  Her son, who is 8 years old, recently developed a seizure disorder last year. He required hospitalization at [facility] in [place]. He had a very difficult time but has finally stabilized and is back to work. Yvonne Hoover realized how important her children are to her and she is so grateful that her son is now back in good health that it has really transformed her chronic depression and put her illness in perspective.  She's had no interim medical problems. She gets an occasional headache. No bone pain. No abdominal pain or change in bowel habit. No vaginal bleeding or discharge.   Medications: reviewed  Allergies:  No Known Allergies  Review of Systems: Hematology:  No bleeding or bruising ENT ROS: No sore throat Breast ROS: No breast masses Respiratory ROS: No cough or dyspnea Cardiovascular ROS:   No chest pain or palpitations Gastrointestinal ROS:   See above Genito-Urinary ROS: See above Musculoskeletal ROS: See above Neurological ROS: See above Dermatological ROS: No rash Remaining ROS negative.  Physical Exam: Blood pressure 135/76, pulse 66, temperature 97.9 F (36.6 C), temperature source Oral, resp. rate 18, height 5' 7.25" (1.708 m), weight 151 lb 6.4 oz (68.675 kg). Wt Readings from Last 3 Encounters:  12/18/12 151 lb 6.4 oz (68.675 kg)  08/23/12 150 lb (68.04 kg)  06/15/12 149 lb 12.8 oz (67.949 kg)     General appearance: Well-nourished Caucasian woman HENNT: Pharynx no erythema, exudate, mass, or ulcer. No thyromegaly or thyroid nodules Lymph nodes: No cervical, supraclavicular, or axillary lymphadenopathy Breasts: Status post right partial mastectomy with excision of the nipple areolar complex. No dominant mass in either breast. No cutaneous lesions. Lungs: Clear to auscultation, resonant to percussion throughout Heart: Regular rhythm, no murmur, no gallop, no rub, no click, no edema Abdomen: Soft, nontender, normal bowel sounds, no mass, no organomegaly Extremities: No edema, no calf tenderness Musculoskeletal: no joint deformities GU:  Vascular: Carotid pulses 2+, no bruits,  Neurologic: Alert, oriented, PERRLA,  cranial nerves grossly normal, motor strength 5 over 5, reflexes 1+ symmetric, upper body coordination normal, gait normal, Skin: No rash or ecchymosis  Lab Results: CBC W/Diff    Component Value Date/Time   WBC 6.8 12/18/2012 1325   WBC 9.6 11/17/2009  1401   RBC 4.22 12/18/2012 1325   RBC 3.29* 11/17/2009 1401   HGB 12.9 12/18/2012 1325   HGB 9.6* 11/17/2009 1401   HCT 38.8 12/18/2012 1325   HCT 29.3* 11/17/2009 1401   PLT 128* 12/18/2012 1325   PLT  161 11/17/2009 1401   MCV 92.0 12/18/2012 1325   MCV 89.2 11/17/2009 1401   MCH 30.5 12/18/2012 1325   MCH 26.4 02/03/2010 1531   MCHC 33.2 12/18/2012 1325   MCHC 32.7 11/17/2009 1401   RDW 14.5 12/18/2012 1325   RDW 22.7* 11/17/2009 1401   LYMPHSABS 1.9 12/18/2012 1325   LYMPHSABS 1.4 11/17/2009 1401   MONOABS 0.8 12/18/2012 1325   MONOABS 1.2* 11/17/2009 1401   EOSABS 0.1 12/18/2012 1325   EOSABS 0.4 11/17/2009 1401   BASOSABS 0.1 12/18/2012 1325   BASOSABS 0.0 11/17/2009 1401     Chemistry      Component Value Date/Time   NA 144 09/07/2012 1107   NA 137 08/09/2011 1101   K 4.4 09/07/2012 1107   K 4.1 08/09/2011 1101   CL 107 02/16/2012 1436   CL 103 08/09/2011 1101   CO2 28 09/07/2012 1107   CO2 28 08/09/2011 1101   BUN 11.9 09/07/2012 1107   BUN 11 08/09/2011 1101   CREATININE 0.7 09/07/2012 1107   CREATININE 0.75 08/09/2011 1101      Component Value Date/Time   CALCIUM 8.9 09/07/2012 1107   CALCIUM 9.1 08/09/2011 1101   ALKPHOS 58 09/07/2012 1107   ALKPHOS 44 08/09/2011 1101   AST 23 09/07/2012 1107   AST 25 08/09/2011 1101   ALT 14 09/07/2012 1107   ALT 16 08/09/2011 1101   BILITOT 0.36 09/07/2012 1107   BILITOT 0.6 08/09/2011 1101       Radiological Studies: Last mammogram done January 31st, 2014 No new abnormalities.  Impression:  #1. Chronic ITP  Stable on Promacta.  Plan continue Promacta indefinitely.  Periodic monitoring of her CBCs and liver functions. I will decrease CBCs to every other month chemistry profiles every quarter.   #2. Stage I, estrogen receptor positive, invasive carcinoma right breast treated as outlined above.  She remains free of any obvious new disease now out almost 5 years from diagnosis in March of 2009.  Plan: Continue tamoxifen. I will schedule a followup mammogram for January 2015.  #3. Chronic anxiety and depression.  Much improved with stabilization of her medical condition and that of her family.   CC: Patient Care Team: Johny Shears as PCP - General  (Family Medicine)   Levert Feinstein, MD 12/16/20148:21 AM

## 2012-12-25 ENCOUNTER — Encounter: Payer: Self-pay | Admitting: *Deleted

## 2012-12-25 NOTE — Progress Notes (Signed)
RECEIVED A FAX FROM [employer]OLOG[employer] CONCERNING A CONFIRMATION OF PRESCRIPTION SHIPMENT FOR PROMACTA ON 12/22/12.

## 2013-01-25 ENCOUNTER — Encounter: Payer: Self-pay | Admitting: *Deleted

## 2013-01-25 NOTE — Progress Notes (Signed)
RECEIVED A FAX FROM [facility]OLOG[facility] CONCERNING A CONFIRMATION OF PRESCRIPTION SHIPMENT FOR PROMACTA ON 01/24/13.

## 2013-01-31 ENCOUNTER — Ambulatory Visit (INDEPENDENT_AMBULATORY_CARE_PROVIDER_SITE_OTHER): Payer: BC Managed Care – PPO | Admitting: Certified Nurse Midwife

## 2013-01-31 ENCOUNTER — Telehealth: Payer: Self-pay | Admitting: Certified Nurse Midwife

## 2013-01-31 ENCOUNTER — Encounter: Payer: Self-pay | Admitting: Certified Nurse Midwife

## 2013-01-31 VITALS — BP 118/74 | HR 72 | Resp 16 | Ht 67.25 in | Wt 153.0 lb

## 2013-01-31 DIAGNOSIS — N950 Postmenopausal bleeding: Secondary | ICD-10-CM

## 2013-01-31 DIAGNOSIS — R5383 Other fatigue: Principal | ICD-10-CM

## 2013-01-31 DIAGNOSIS — R5381 Other malaise: Secondary | ICD-10-CM

## 2013-01-31 LAB — CBC
HCT: 38.7 % (ref 36.0–46.0)
Hemoglobin: 12.9 g/dL (ref 12.0–15.0)
MCH: 30.1 pg (ref 26.0–34.0)
MCHC: 33.3 g/dL (ref 30.0–36.0)
MCV: 90.4 fL (ref 78.0–100.0)
Platelets: 144 10*3/uL — ABNORMAL LOW (ref 150–400)
RBC: 4.28 MIL/uL (ref 3.87–5.11)
RDW: 15.3 % (ref 11.5–15.5)
WBC: 6.2 10*3/uL (ref 4.0–10.5)

## 2013-01-31 LAB — HEMOGLOBIN, FINGERSTICK: Hemoglobin, fingerstick: 13 g/dL (ref 12.0–16.0)

## 2013-01-31 LAB — TSH: TSH: 1.369 u[IU]/mL (ref 0.350–4.500)

## 2013-01-31 NOTE — Telephone Encounter (Signed)
Pt is having heavy periods and she is on tamoxifen (NOLVADEX) 20 MG Pt is very concerned and would like to discuss this with the nurse.

## 2013-01-31 NOTE — Patient Instructions (Signed)
Postmenopausal Bleeding Postmenopausal bleeding is any bleeding a woman has after she has entered into menopause. Menopause is the end of a woman's fertile years. After menopause, a woman no longer ovulates or has menstrual periods.  Postmenopausal bleeding can be caused by various things. Any type of postmenopausal bleeding, even if it appears to be a typical menstrual period, is concerning. This should be evaluated by your health care provider. Any treatment will depend on the cause of the bleeding. HOME CARE INSTRUCTIONS Monitor your condition for any changes. The following actions may help to alleviate any discomfort you are experiencing:  Avoid the use of tampons and douches as directed by your health care provider.  Change your pads frequently.  Get regular pelvic exams and Pap tests.  Keep all follow-up appointments for diagnostic tests as directed by your health care provider. SEEK MEDICAL CARE IF:   Your bleeding lasts more than 1 week.  You have abdominal pain.  You have bleeding with sexual intercourse. SEEK IMMEDIATE MEDICAL CARE IF:   You have a fever, chills, headache, dizziness, muscle aches, and bleeding.  You have severe pain with bleeding.  You are passing blood clots.  You have bleeding and need more than 1 pad an hour.  You feel faint. MAKE SURE YOU:  Understand these instructions.  Will watch your condition.  Will get help right away if you are not doing well or get worse. Document Released: 03/31/2005 Document Revised: 10/11/2012 Document Reviewed: 07/20/2012 Exit[employer] Patient Information 2014 [employer].  

## 2013-01-31 NOTE — Progress Notes (Signed)
53 y.o.[place] female complaining of 3 days history of vaginal bleeding. She describes it as heavier than normal period she is used to, had increase in flow of changing a pad an hour 24 hours ago, now has change only 4 times and decreasing in amount.  She had cramping and some PMS symptoms prior to onset. She has had previous episodes of menopausal bleeding in 7/14.and also in 2013.  Patient has had 2 endometrial biopsies all benign, last one in 4/14, showing polypoid proliferative endometrium, benign. Patient does have history of fibroid. Patient continues on Tamoxifen.. She has been menopausal since age 36. Associated symptoms are fatigue only, but eating and drinking well.   Workup today  CBC and TSH. Patient has history of ITP and last platelet count better. Patient continues on Promacta with good response.   reports that she has been smoking.  She does not have any smokeless tobacco history on file. She reports that she drinks about 3.0 ounces of alcohol per week. She reports that she does not use illicit drugs.  Patient Active Problem List   Diagnosis Date Noted  . Abnormal perimenopausal bleeding 04/10/2012  . Breast cancer, stage 1 11/16/2010  . Depression with anxiety 11/16/2010  . Immune thrombocytopenic purpura 11/12/2010    Past Medical History  Diagnosis Date  . Depression with anxiety 11/16/2010  . Bronchiolitis   . Abnormal uterine bleeding   . Depression   . ITP (idiopathic thrombocytopenic purpura)   . Alcohol abuse, in remission   . Fibroid 1992  . STD (sexually transmitted disease)     positive HPV  . Cancer 07/2007    breast  . Cellulitis     Past Surgical History  Procedure Laterality Date  . Bone marrow biopsy  09/2000    back hip  . Breast surgery  01/2000     left breast mass/ benign  . Endometrial biopsy    . Ear bone surgery      Current Outpatient Prescriptions  Medication Sig Dispense Refill  . buPROPion (WELLBUTRIN) 100 MG tablet Take  100 mg by mouth 2 (two) times daily.      . citalopram (CELEXA) 40 MG tablet Take by mouth Daily.       Marland Kitchen eltrombopag (PROMACTA) 50 MG tablet Take 1 tablet (50 mg total) by mouth daily.  30 tablet  prn  . tamoxifen (NOLVADEX) 20 MG tablet TAKE ONE TABLET DAILY  90 tablet  0   No current facility-administered medications for this visit.    ROS: Pertinent to exam Hgb.13.0 Exam:    BP 118/74  Pulse 72  Resp 16  Ht 5' 7.25" (1.708 m)  Wt 153 lb (69.4 kg)  BMI 23.79 kg/m2  LMP 01/29/2013  General appearance: alert, appears stated age and no distress Skin warm and dry Abdomen: non tender, soft Pelvic exam: external genital : normal, no lesions, BUS negative Vagina: small amount of blood noted, non tender Cervix: non tender, normal appearance, scant amount of blood noted from cervix Uterus: normal, non tender,firm, mid position Adnexa: normal, non tender, no masses  A:  Post menopausal bleeding, normal pelvic exam History of PMB with previous negative endometrial biopsy and PUS History of breast cancer on Tamoxifen History of ITP fairly stable at present on medication  P:Discussed finding of normal exam and concerns with PMB, which patient is aware of. Will consult with Dr.Miller regarding need for repeat biopsy/PUS and patient will be advised. Warning signs and symptoms of excessive vaginal bleeding and  will advise. Continue with follow up as indicated.

## 2013-01-31 NOTE — Telephone Encounter (Signed)
Spoke with patient. She states she has started a period  On 1/26. Last period about a year ago per patient. States this period has been increasing in heaviness since last night. Wearing a regular pad, changing q 1.5 to 1 hour. She denies complaints other than fatigue. Discussed with Regina Eck CNM, okay to schedule 1:00 PM appointment as Dr. Charlies Constable will be here then too. Advised patient. She will come at 1:00. Routing to provider for final review. Patient agreeable to disposition. Will close encounter

## 2013-02-01 NOTE — Progress Notes (Signed)
Reviewed personally.  Felipa Emory, MD. Plan PUS and possible [facility] if endometrium thickened to evaluate for polyp.  Endometrial biopsy most likely same day.

## 2013-02-02 ENCOUNTER — Telehealth: Payer: Self-pay | Admitting: *Deleted

## 2013-02-02 ENCOUNTER — Telehealth: Payer: Self-pay | Admitting: Obstetrics & Gynecology

## 2013-02-02 NOTE — Telephone Encounter (Signed)
Telephoned patient/ advised of $25 copay for PUS/SHGM/Endo BX/ scheduled procedure/ advised of cancellation policy and cancellation fee//ssf

## 2013-02-02 NOTE — Telephone Encounter (Signed)
I would wait until after procedure for AEX.  Can always reschedule it so if she is ok, just cancel it for now.

## 2013-02-02 NOTE — Telephone Encounter (Signed)
Pt. Calls.  She is having vaginal bleeding and is now scheduled for her 3rd endometrial biopsy (02-15-13).  Given this is the third time, she is asking if she should stop the tamoxifen?   Will discuss with Dr. Beryle Beams and get back with her.  Call back is [phone]. Let her know will probably be next week and she is fine with this.  Patient is scheduled to see Dr. Alvy Bimler in June 2015.

## 2013-02-02 NOTE — Telephone Encounter (Signed)
Patient is scheduled for PUS/SHGM/Bx on 2/12 with Dr. Sabra Heck. She has annual exam scheduled for 02/05/13 with Regina Eck CNM. Should she keep Annual exam as scheduled or wait until after procedure appointment?  Pre-procedure instructions given. She has questions regarding need for yet another endometrial biopsy as she had one done with Dr. Joan Flores on  03/01/13. Advised per note from Dr. Sabra Heck that it was planned for pus and move forward with SHGM/Bx based on lining of endometrium but that Dr. Sabra Heck would review first and speak with patient prior to further testing. Patient is agreeable and verbalized understanding of plan.   She is wondering if this bleeding is r/t Tamoxifen and wonders about stopping Tamoxifen and I advised that it would be best to discuss her concerns regarding Tamoxifen with Dr. Beryle Beams but that she could discuss with Dr. Sabra Heck her thoughts on the Tamoxifen causing abnormal bleeding.

## 2013-02-02 NOTE — Telephone Encounter (Signed)
Discussed with Dr. Beryle Beams.  Patient should go ahead and stop tamoxifen and re-evaluate options when she sees Dr. Alvy Bimler in June.  Called patient and she is very happy with this direction.  She appreciated the call back.

## 2013-02-02 NOTE — Telephone Encounter (Signed)
Left detailed message per patient request with message from Dr. Sabra Heck.

## 2013-02-06 ENCOUNTER — Telehealth: Payer: Self-pay

## 2013-02-06 NOTE — Telephone Encounter (Signed)
Message copied by Susy Manor on Tue Feb 06, 2013 11:33 AM ------      Message from: Regina Eck      Created: Fri Feb 02, 2013  5:22 PM       Notify patient that platelets were 144, better than 128 a month ago      TSH normal      HGB 11.8 which she was aware.      Patient status       ------

## 2013-02-06 NOTE — Telephone Encounter (Signed)
lmtcb

## 2013-02-07 ENCOUNTER — Ambulatory Visit: Payer: Self-pay | Admitting: Certified Nurse Midwife

## 2013-02-08 ENCOUNTER — Ambulatory Visit: Payer: Self-pay | Admitting: Certified Nurse Midwife

## 2013-02-08 NOTE — Telephone Encounter (Signed)
Patient notified of results.

## 2013-02-15 ENCOUNTER — Telehealth: Payer: Self-pay | Admitting: Obstetrics & Gynecology

## 2013-02-15 ENCOUNTER — Ambulatory Visit (INDEPENDENT_AMBULATORY_CARE_PROVIDER_SITE_OTHER): Payer: BC Managed Care – PPO | Admitting: Obstetrics & Gynecology

## 2013-02-15 ENCOUNTER — Ambulatory Visit (INDEPENDENT_AMBULATORY_CARE_PROVIDER_SITE_OTHER): Payer: BC Managed Care – PPO

## 2013-02-15 ENCOUNTER — Other Ambulatory Visit: Payer: Self-pay | Admitting: Obstetrics & Gynecology

## 2013-02-15 VITALS — BP 146/86 | Ht 67.25 in | Wt 154.8 lb

## 2013-02-15 DIAGNOSIS — N950 Postmenopausal bleeding: Secondary | ICD-10-CM

## 2013-02-15 DIAGNOSIS — D250 Submucous leiomyoma of uterus: Secondary | ICD-10-CM

## 2013-02-15 DIAGNOSIS — D259 Leiomyoma of uterus, unspecified: Secondary | ICD-10-CM

## 2013-02-15 DIAGNOSIS — D219 Benign neoplasm of connective and other soft tissue, unspecified: Secondary | ICD-10-CM

## 2013-02-15 DIAGNOSIS — N939 Abnormal uterine and vaginal bleeding, unspecified: Secondary | ICD-10-CM

## 2013-02-15 NOTE — Telephone Encounter (Signed)
Pt is returning a call to [place]. Pt states this call is from last week and she is coming in today for an appointment.

## 2013-02-15 NOTE — Progress Notes (Signed)
53 y.o.Divorcedfemale here for a pelvic ultrasound with sonohystogram due to PMP bleeding while on Tamoxifen.  Has had bleeding yearly for the past several years.  Had biopsies in 2013 and 2014.  Frustrated with having to do this yearly.  Thinks she may want to stop the Tamoxifen early.  Near her five year mark.  Aware new recommendations are to continue for 10 years.  Feels she still may have gotten enough for good benefit.  Has discussed this with oncologist.  However, Dr. Beryle Beams will be leaving and she is establishing care with new hematologist/oncologist.  Appt is scheduled.    Bleeding was 3 days and like a period.  Bright red with some small clots.  No PMS symptoms or breast tenderness or change in mood before bleeding began.    No recent [place] has been done as patient is on Tamoxifen.  Indication:  PMP on tamoxifen  Patient's last menstrual period was 01/29/2013.  Technique:  Both transabdominal and transvaginal ultrasound examinations of the pelvis were performed. Transabdominal technique was performed for global imaging of the pelvis including uterus,  ovaries, adnexal regions, and pelvic cul-de-sac.  It was necessary to proceed with endovaginal exam following the abdominal ultrasound transabdominal exam to visualize the endometrium and adnexa.  Color and duplex Doppler ultrasound was utilized to evaluate blood flow to the ovaries.    FINDINGS: UTERUS: 12 x 7 x 6.5cm with multiple (stable fibroids)--70mm, 87mm, 22mm, 78mm EMS: 7mm ADNEXA: left 2.2 x 0.9 x 1.4cm Right 2.8 x 1.8 x 2.5cm with simple 2.7cm avascular cyst CUL DE SAC: no free fluid  SHSG:  After obtaining appropriate verbal consent from patient, the cervix was visualized using a speculum, and prepped with betadine.  A tenaculum  was applied to the cervix.  Dilation of the cervix was not necessary. The catheter was passed into the uterus and sterile saline introduced, with the following findings: submucosal fibroid 67mm in  size, endometrium is thin.  Endometrial biopsy recommended.  Discussed with patient.  Verbal and written consent obtained.   Procedure:  Speculum placed.  Cervix visualized and cleansed with betadine prep.  A single toothed tenaculum was applied to the anterior lip of the cervix.  Endometrial pipelle was advanced through the cervix into the endometrial cavity without difficulty.  Pipelle passed to 10cm.  Suction applied and pipelle removed with good tissue sample obtained.  Tenculum removed.  No bleeding noted.  Patient tolerated procedure well.  Assessment:  PMP bleeding in patient who is on Tamoxifen Uterine fibroids including submucosal fibroid H/O ITP, no active problems with this at this time  Plan:  Currently off Tamoxifen.  D/W pt I feel the bleeding is most likely coming from the submucosal fibroid.  Before I recommend she completley stop the Tamoxifen, I would recommend a resection of the submucosal fibroid.  If bleeding continues, then could do decide about stopping.  She is most likely going to discuss with new oncologist.  All questions answered.  ~25 minutes spent with patient >50% of time was in face to face discussion of above.

## 2013-02-16 NOTE — Telephone Encounter (Signed)
Pt not sure when she should schedule her aex since she may have to have surgery. Would like nurse to call her.

## 2013-02-16 NOTE — Telephone Encounter (Signed)
Pt may have listened to an old message because i have not talked to her since our last conversation. Pt came in for appt the same day she called so no need to call patient

## 2013-02-19 ENCOUNTER — Other Ambulatory Visit (HOSPITAL_BASED_OUTPATIENT_CLINIC_OR_DEPARTMENT_OTHER): Payer: BC Managed Care – PPO

## 2013-02-19 DIAGNOSIS — D693 Immune thrombocytopenic purpura: Secondary | ICD-10-CM

## 2013-02-19 LAB — COMPREHENSIVE METABOLIC PANEL (CC13)
ALT: 13 U/L (ref 0–55)
AST: 21 U/L (ref 5–34)
Albumin: 3.4 g/dL — ABNORMAL LOW (ref 3.5–5.0)
Alkaline Phosphatase: 47 U/L (ref 40–150)
Anion Gap: 6 mEq/L (ref 3–11)
BUN: 8.4 mg/dL (ref 7.0–26.0)
CO2: 27 mEq/L (ref 22–29)
Calcium: 9.1 mg/dL (ref 8.4–10.4)
Chloride: 108 mEq/L (ref 98–109)
Creatinine: 0.7 mg/dL (ref 0.6–1.1)
Glucose: 99 mg/dl (ref 70–140)
Potassium: 4.3 mEq/L (ref 3.5–5.1)
Sodium: 141 mEq/L (ref 136–145)
Total Bilirubin: 0.5 mg/dL (ref 0.20–1.20)
Total Protein: 6.2 g/dL — ABNORMAL LOW (ref 6.4–8.3)

## 2013-02-19 LAB — CBC WITH DIFFERENTIAL/PLATELET
BASO%: 1.7 % (ref 0.0–2.0)
Basophils Absolute: 0.1 10*3/uL (ref 0.0–0.1)
EOS%: 2.8 % (ref 0.0–7.0)
Eosinophils Absolute: 0.2 10*3/uL (ref 0.0–0.5)
HCT: 37.3 % (ref 34.8–46.6)
HGB: 12.3 g/dL (ref 11.6–15.9)
LYMPH%: 35.3 % (ref 14.0–49.7)
MCH: 29.6 pg (ref 25.1–34.0)
MCHC: 33 g/dL (ref 31.5–36.0)
MCV: 89.9 fL (ref 79.5–101.0)
MONO#: 0.9 10*3/uL (ref 0.1–0.9)
MONO%: 14.4 % — ABNORMAL HIGH (ref 0.0–14.0)
NEUT#: 2.9 10*3/uL (ref 1.5–6.5)
NEUT%: 45.8 % (ref 38.4–76.8)
Platelets: 205 10*3/uL (ref 145–400)
RBC: 4.15 10*6/uL (ref 3.70–5.45)
RDW: 14.7 % — ABNORMAL HIGH (ref 11.2–14.5)
WBC: 6.4 10*3/uL (ref 3.9–10.3)
lymph#: 2.3 10*3/uL (ref 0.9–3.3)

## 2013-02-19 NOTE — Telephone Encounter (Signed)
LMTCB about rescheduling Aex appt.

## 2013-02-20 ENCOUNTER — Telehealth: Payer: Self-pay | Admitting: *Deleted

## 2013-02-20 NOTE — Telephone Encounter (Signed)
Pt returning call. Would also like to get results of her biopsy.

## 2013-02-20 NOTE — Telephone Encounter (Signed)
Called patient and informed platelet count is 205,000. Per Dr. Beryle Beams.  Patient verbalized understanding.

## 2013-02-20 NOTE — Telephone Encounter (Signed)
Message copied by Norma Fredrickson on Tue Feb 20, 2013 11:24 AM ------      Message from: [place], [place]      Created: Tue Feb 20, 2013 11:06 AM                   ----- Message -----         From: Annia Belt, MD         Sent: 02/20/2013  10:19 AM           To: Ignacia Felling, RN, Jesse Fall, RN, #            Call [id number] count [phone]!  Must be the tofu at [facility] ------

## 2013-02-20 NOTE — Telephone Encounter (Signed)
Patient had ultrasound and endo biopsy on 02-15-13. Advised results still pending, has only been 2 full business days.  Asking when to schedule AEX that she delayed in order to have endo biopsy? Advised I will have provider instruct on aex when reviews biopsy report since that will help determine next step.  Was scheduled for AEX on 02-08-13.

## 2013-02-21 ENCOUNTER — Encounter: Payer: Self-pay | Admitting: Obstetrics & Gynecology

## 2013-02-21 DIAGNOSIS — D250 Submucous leiomyoma of uterus: Secondary | ICD-10-CM | POA: Insufficient documentation

## 2013-02-21 DIAGNOSIS — D219 Benign neoplasm of connective and other soft tissue, unspecified: Secondary | ICD-10-CM | POA: Insufficient documentation

## 2013-02-23 NOTE — Telephone Encounter (Signed)
Per Dr Sabra Heck, Endometrial biopsy benign.  Bleeding may be related to submucosal fibroid.  If is still off tamoxifen, we can do [place] level to determine if menopausal.  If menopausal, really need to consider hysteroscopic resection of fibroid since should not be bleeding.  Long term, would recommend she stay on tamoxifen for 10 years and she has  been on it for less than 5 years.Patient voiced understanding of all of this info and is agreeable to [facility] level.  Scheduled lab appt for 02-27-13. Patient aware that Dr Sabra Heck can talk to her after patients today if she has any additional questions.  Patient aware we will contact her with [facility] results for next step.  Only question is to confirm that ok to do [facility] if has been off tamoxifen since 02-15-13.  Per Dr Sabra Heck this is excellent timing.    Routing to provider for final review. Patient agreeable to disposition. Will close encounter

## 2013-02-23 NOTE — Telephone Encounter (Signed)
Pt calling for biopsy results.

## 2013-02-23 NOTE — Telephone Encounter (Signed)
Dr. Sabra Heck, can you sign off on path and I can given results and when should patient be scheduled for annual exam?

## 2013-02-23 NOTE — Telephone Encounter (Signed)
Call to patient, LMTCB.  Result received today and Dr Sabra Heck will review today.  We will call her.

## 2013-02-27 ENCOUNTER — Encounter: Payer: Self-pay | Admitting: *Deleted

## 2013-02-27 ENCOUNTER — Other Ambulatory Visit: Payer: BC Managed Care – PPO

## 2013-02-27 NOTE — Progress Notes (Signed)
RECEIVED A FAX FROM [facility]OLOG[facility] CONCERNING A CONFIRMATION OF PRESCRIPTION SHIPMENT FOR PROMACTA ON 02/27/13.

## 2013-02-28 ENCOUNTER — Other Ambulatory Visit (INDEPENDENT_AMBULATORY_CARE_PROVIDER_SITE_OTHER): Payer: BC Managed Care – PPO

## 2013-02-28 DIAGNOSIS — N926 Irregular menstruation, unspecified: Secondary | ICD-10-CM

## 2013-02-28 DIAGNOSIS — N939 Abnormal uterine and vaginal bleeding, unspecified: Secondary | ICD-10-CM

## 2013-03-01 LAB — FOLLICLE STIMULATING HORMONE: FSH: 23.2 m[IU]/mL

## 2013-03-02 ENCOUNTER — Telehealth: Payer: Self-pay | Admitting: *Deleted

## 2013-03-02 NOTE — Telephone Encounter (Signed)
Per Dr Sabra Heck instruction, notified patient that [facility] is borderline for menopause.  Dr Sabra Heck does recommend Hysteroscopy with Fibroid resection since fibroid is likely contributing to her bleeding. Patient agreeable Will proceed with scheduling and will have consult with Dr Sabra Heck pre-operatively to review surgery. Consult scheduled for 03-08-13. Will call her back with date for surgery.  Patient has two questions 1) Since not fully menopausal, does she need to be concerned about birth control. Advised although I think the likleihood is low, need to review this with DR Sabra Heck. 2) Since Dr Sabra Heck has mentioned preference to keep her on tamoxifen, should she restart it now or wait till after surgery?  Advised I will call her back Monday with these answers.

## 2013-03-05 NOTE — Telephone Encounter (Signed)
Thank you. Encounter closed. 

## 2013-03-06 ENCOUNTER — Encounter: Payer: Self-pay | Admitting: Oncology

## 2013-03-06 NOTE — Progress Notes (Signed)
Received forms from [place] for asst with Promacta. I called for info and had to leave a message.

## 2013-03-07 ENCOUNTER — Telehealth: Payer: Self-pay | Admitting: *Deleted

## 2013-03-07 NOTE — Telephone Encounter (Signed)
Dr Sabra Heck contacted patient directly.

## 2013-03-07 NOTE — Telephone Encounter (Signed)
Patient calling back after receiving message from Dr Sabra Heck.  She is aware and understanding of Dr Sabra Heck rescheduling due to jury duty.  She is willing to proceed with scheduling surgery and discussing at pre-op.  Will schedule and call her back.  Has 2 questions: 1) When do you want her to have AEX which was scheduled with Debbi and was due 02-08-2013?  2) Since [facility] was borderline, does she need contraception?  If you want to call her prior to pre-op, she is available this afternoon, tomm till 1030 and all day Friday.  Patient requests to schedule surgery for week of 03-26-13.

## 2013-03-08 ENCOUNTER — Institutional Professional Consult (permissible substitution): Payer: BC Managed Care – PPO | Admitting: Obstetrics & Gynecology

## 2013-03-08 NOTE — Telephone Encounter (Signed)
Thank you.  [name] is high enough that her pregnancy risk is really extremely low if not impossible.  I think contraception is really not needed.  I am very grateful she is so understanding of this week.

## 2013-03-08 NOTE — Telephone Encounter (Signed)
Patient notified of Dr Ammie Ferrier instruction regarding contraception.  Also notified surgery scheduled for 03-27-13 at 0900.  Patient on her way into work and cant talk. She requests to call back tomorrow to schedule appointments and discuss surgery instructions.

## 2013-03-09 NOTE — Telephone Encounter (Signed)
Spoke with patient. Advised that surgeons fee are $512.65 and that payment is due in full 03/13/2013. Patient agrees.

## 2013-03-09 NOTE — Telephone Encounter (Signed)
Patient called back, surgery instructions reviewed and pre/post op appts scheduled.  Written patient instruction sheet mailed.  Patient asking about insurance issues, advised Gabriel Cirri is working on this.  Gabriel Cirri, encounter can be closed when you are finished.

## 2013-03-10 ENCOUNTER — Encounter (HOSPITAL_COMMUNITY): Payer: Self-pay | Admitting: Emergency Medicine

## 2013-03-10 ENCOUNTER — Emergency Department (HOSPITAL_COMMUNITY)
Admission: EM | Admit: 2013-03-10 | Discharge: 2013-03-10 | Disposition: A | Discharge status: Home / Self Care | Payer: BC Managed Care – PPO | Source: Home / Self Care | Attending: Family Medicine | Admitting: Family Medicine

## 2013-03-10 DIAGNOSIS — R233 Spontaneous ecchymoses: Secondary | ICD-10-CM

## 2013-03-10 LAB — CBC WITH DIFFERENTIAL/PLATELET
Basophils Absolute: 0.1 10*3/uL (ref 0.0–0.1)
Basophils Relative: 1 % (ref 0–1)
Eosinophils Absolute: 0.1 10*3/uL (ref 0.0–0.7)
Eosinophils Relative: 1 % (ref 0–5)
HCT: 39.5 % (ref 36.0–46.0)
Hemoglobin: 13.8 g/dL (ref 12.0–15.0)
Lymphocytes Relative: 32 % (ref 12–46)
Lymphs Abs: 2.2 10*3/uL (ref 0.7–4.0)
MCH: 30.4 pg (ref 26.0–34.0)
MCHC: 34.9 g/dL (ref 30.0–36.0)
MCV: 87 fL (ref 78.0–100.0)
Monocytes Absolute: 0.7 10*3/uL (ref 0.1–1.0)
Monocytes Relative: 10 % (ref 3–12)
Neutro Abs: 3.8 10*3/uL (ref 1.7–7.7)
Neutrophils Relative %: 55 % (ref 43–77)
Platelets: 211 10*3/uL (ref 150–400)
RBC: 4.54 MIL/uL (ref 3.87–5.11)
RDW: 14.5 % (ref 11.5–15.5)
WBC: 6.9 10*3/uL (ref 4.0–10.5)

## 2013-03-10 LAB — BASIC METABOLIC PANEL
BUN: 8 mg/dL (ref 6–23)
CO2: 26 mEq/L (ref 19–32)
Calcium: 9.2 mg/dL (ref 8.4–10.5)
Chloride: 101 mEq/L (ref 96–112)
Creatinine, Ser: 0.64 mg/dL (ref 0.50–1.10)
GFR calc Af Amer: >90 mL/min (ref >90)
GFR calc non Af Amer: >90 mL/min (ref >90)
Glucose, Bld: 115 mg/dL — ABNORMAL HIGH (ref 70–99)
Potassium: 4.2 mEq/L (ref 3.7–5.3)
Sodium: 138 mEq/L (ref 137–147)

## 2013-03-10 LAB — PROTIME-INR
INR: 1.05 (ref 0.00–1.49)
Prothrombin Time: 13.5 seconds (ref 11.6–15.2)

## 2013-03-10 LAB — APTT: aPTT: 28 seconds (ref 24–37)

## 2013-03-10 NOTE — ED Provider Notes (Signed)
CSN: [id number]     Arrival date & time 03/10/13  1204 History   First MD Initiated Contact with Patient 03/10/13 1336     Chief Complaint  Patient presents with  . 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 She has had this petechia before and it usually signals a very low platelet count. It has been so low in the past that she has required hospitalization. She has also required IVIG in the past. She is status post splenectomy in 2011. She has previously failed steroids, Rituxan, and splenectomy. She was put on Nplate between May 8675 and November 2012 with good response. She was changed to [facility] in September 2012 and has been stable on this medication with platelet counts consistently over 100,000. She continues to take the Promacta and is compliant with her regimen. Denies hematuria or bleeding from her gums.   Patient is a 53 y.o. female presenting with rash.  Rash Associated symptoms: no abdominal pain, no fever, no joint pain, no myalgias, no nausea, no shortness of breath and not vomiting     Past Medical History  Diagnosis Date  . Depression with anxiety 11/16/2010  . Bronchiolitis   . Abnormal uterine bleeding   . Depression   . ITP (idiopathic thrombocytopenic purpura)   . Alcohol abuse, in remission   . Fibroid 1992  . STD (sexually transmitted disease)     positive HPV  . Cancer 07/2007    breast  . Cellulitis    Past Surgical History  Procedure Laterality Date  . Bone marrow biopsy  09/2000    back hip  . Breast surgery  01/2000     left breast mass/ benign  . Endometrial biopsy    . Ear bone surgery     Family History  Problem Relation Age of Onset  . Hypertension Mother   . Thyroid disease Mother   . Irregular heart beat Father   . Breast cancer Maternal Aunt   .  Tuberculosis Maternal Grandmother   . Diabetes Paternal Grandmother   . Cancer Maternal Grandfather     lung   History  Substance Use Topics  . Smoking status: Light Tobacco Smoker  . Smokeless tobacco: Not on file     Comment: very rare  . Alcohol Use: 3.0 oz/week    6 drink(s) per week   OB History   Grav Para Term Preterm Abortions TAB SAB Ect Mult Living   '2 2 2       2     ' Review of Systems  Constitutional: Negative for fever and chills.  Eyes: Negative for visual disturbance.  Respiratory: Negative for cough and shortness of breath.   Cardiovascular: Negative for chest pain, palpitations and leg swelling.  Gastrointestinal: Negative for nausea, vomiting and abdominal pain.  Endocrine: Negative for polydipsia and polyuria.  Genitourinary: Negative for dysuria, urgency and frequency.  Musculoskeletal: Negative for arthralgias and myalgias.  Skin: Positive for rash.  Neurological: Negative for dizziness, weakness and light-headedness.    Allergies  Review of patient's allergies indicates no known allergies.  Home Medications   Current Outpatient Rx  Name  Route  Sig  Dispense  Refill  . buPROPion (WELLBUTRIN) 100 MG tablet   Oral   Take 100 mg by mouth 2 (two) times daily.         . citalopram (CELEXA) 40 MG  tablet   Oral   Take by mouth Daily.          Marland Kitchen eltrombopag (PROMACTA) 50 MG tablet   Oral   Take 1 tablet (50 mg total) by mouth daily.   30 tablet   prn     This script was faxed to [facility] @ [phone] ...   . tamoxifen (NOLVADEX) 20 MG tablet      TAKE ONE TABLET DAILY   90 tablet   0    BP 148/71  Pulse 74  Temp(Src) 98.5 F (36.9 C) (Oral)  Resp 18  SpO2 100% Physical Exam  Nursing note and vitals reviewed. Constitutional: She is oriented to person, place, and time. Vital signs are normal. She appears well-developed and well-nourished. No distress.  HENT:  Head: Normocephalic and atraumatic.  Pulmonary/Chest: Effort normal.  No respiratory distress.  Neurological: She is alert and oriented to person, place, and time. She has normal strength. Coordination normal.  Skin: Skin is warm and dry. Rash (faint nonpalpable petechial rash to the left distal arm from the midforearm down to the hand) noted. She is not diaphoretic.  Psychiatric: She has a normal mood and affect. Judgment normal.    ED Course  Procedures (including critical care time) Labs Review Labs Reviewed  BASIC METABOLIC PANEL - Abnormal; Notable for the following:    Glucose, Bld 115 (*)    All other components within normal limits  CBC WITH DIFFERENTIAL  APTT  PROTIME-INR   Imaging Review No results found.   MDM   1. Petechiae    There is no obvious areas of bleeding just the gums. Platelet count is 211, well within normal limits. BMP is unremarkable. Checked PT and PTT, these are normal as well. Followup with PCP. Nothing to do at this time. I advised her to come back in if the petechiae continues to worsen or she starts to have bleeding and bruising, or to call her hematologist if they are open     Liam Graham, PA-C 03/10/13 1526

## 2013-03-10 NOTE — ED Notes (Signed)
Reports petichea of the the left hand.  On set 1 hour ago.  Mild pain.

## 2013-03-10 NOTE — ED Provider Notes (Signed)
Medical screening examination/treatment/procedure(s) were performed by resident physician or non-physician practitioner and as supervising physician I was immediately available for consultation/collaboration.   KINDL,JAMES DOUGLAS MD.   James D Kindl, MD 03/10/13 1650 

## 2013-03-12 ENCOUNTER — Telehealth: Payer: Self-pay | Admitting: *Deleted

## 2013-03-12 NOTE — Telephone Encounter (Signed)
Patient called back. She wanted to know if copays are applied to her deductible. I advised that they are not. She asked if she can make her surgery pre-payment on Thursday, March 12. I advised that she can. She states that she will drop her payment by the office this Thursday.

## 2013-03-12 NOTE — Telephone Encounter (Signed)
Received vm call this am from pt stating that sat, at work, she noticed petechia on the back of her right hand & on up into her arm.  She went to an urgent care & cbc was done & platelets were 211,000 & coag study done was normal.  She state that they are still vaguely there & doesn't know why she had them but just letting us know.

## 2013-03-12 NOTE — Telephone Encounter (Signed)
Patient has some billing questions re: upcoming surgery.

## 2013-03-12 NOTE — Telephone Encounter (Signed)
lmtcb

## 2013-03-13 ENCOUNTER — Institutional Professional Consult (permissible substitution): Payer: BC Managed Care – PPO | Admitting: Obstetrics & Gynecology

## 2013-03-14 ENCOUNTER — Other Ambulatory Visit: Payer: Self-pay | Admitting: Oncology

## 2013-03-14 ENCOUNTER — Institutional Professional Consult (permissible substitution): Payer: BC Managed Care – PPO | Admitting: Obstetrics & Gynecology

## 2013-03-15 ENCOUNTER — Encounter (HOSPITAL_COMMUNITY): Payer: Self-pay | Admitting: Pharmacist

## 2013-03-15 ENCOUNTER — Telehealth: Payer: Self-pay | Admitting: *Deleted

## 2013-03-15 ENCOUNTER — Other Ambulatory Visit: Payer: Self-pay | Admitting: *Deleted

## 2013-03-15 NOTE — Telephone Encounter (Signed)
error 

## 2013-03-15 NOTE — Telephone Encounter (Signed)
Patient calls and expresses concern about paying $500 for a procedure when she hasn't talked with the doctor.  She states she is still questioning the need for the procedure.  (she was previously scheduled for consult that was delayed due to Dr Sabra Heck on jury duty).  Advised that we only scheduled her surgery prior to talking to dr to prevent her from having to wait to long since we rescheduled her consult.  Patient estimated are collected by our office two weeks prior butwe can certainly allow her to wait until after pre-op on 03-20-13 and make payment once she has decided if wants to proceed.  Patient agreeable.  Hollace Hayward, please check with Dr Sabra Heck before we make any further calls to collect pre-surgery amount.    Dr Sabra Heck, just [facility] and she is scheduled for consult on 02-20-13 at 845.  Ok to close encounter?

## 2013-03-15 NOTE — Telephone Encounter (Signed)
Patient is wondering if she should continue on the tamoxifen after surgery.  She stopped this when she had the biopsy - approximately the first week in February.  Per Dr. Beryle Beams:  She can resume the tamoxifen 2 weeks after the surgery.  She appreciated the call back.

## 2013-03-16 ENCOUNTER — Telehealth: Payer: Self-pay | Admitting: Obstetrics & Gynecology

## 2013-03-16 NOTE — Telephone Encounter (Signed)
Hospital is calling for orders for patient.

## 2013-03-16 NOTE — Telephone Encounter (Signed)
Patient is scheduled for pre-op visit here on 03-20-13 and has hospital preop appt to follow on same day.

## 2013-03-18 NOTE — Telephone Encounter (Signed)
I will do orders after I see her as she is undecided yet about procedure.  Encounter closed.

## 2013-03-20 ENCOUNTER — Telehealth: Payer: Self-pay | Admitting: *Deleted

## 2013-03-20 ENCOUNTER — Ambulatory Visit (INDEPENDENT_AMBULATORY_CARE_PROVIDER_SITE_OTHER): Payer: BC Managed Care – PPO | Admitting: Obstetrics & Gynecology

## 2013-03-20 ENCOUNTER — Encounter: Payer: Self-pay | Admitting: Obstetrics & Gynecology

## 2013-03-20 ENCOUNTER — Inpatient Hospital Stay (HOSPITAL_COMMUNITY)
Admission: RE | Admit: 2013-03-20 | Discharge: 2013-03-20 | Disposition: A | Discharge status: Home / Self Care | Payer: BC Managed Care – PPO | Source: Ambulatory Visit

## 2013-03-20 DIAGNOSIS — D250 Submucous leiomyoma of uterus: Secondary | ICD-10-CM

## 2013-03-20 DIAGNOSIS — N925 Other specified irregular menstruation: Secondary | ICD-10-CM

## 2013-03-20 DIAGNOSIS — N949 Unspecified condition associated with female genital organs and menstrual cycle: Secondary | ICD-10-CM

## 2013-03-20 DIAGNOSIS — N938 Other specified abnormal uterine and vaginal bleeding: Secondary | ICD-10-CM

## 2013-03-20 NOTE — Telephone Encounter (Signed)
Patient called and left message.  After conversation/discussion with Dr. Sabra Heck patient has decided to cancel surgery and will do "wait and see"  Approach.  Her question is should she resume the tamoxifen?  Should Dr. Beryle Beams decide or Dr. Alvy Bimler decide when she sees her June 15th/2015.  Pt. Call back is [phone].

## 2013-03-20 NOTE — Patient Instructions (Addendum)
Your procedure is scheduled on: Tuesday, March 27, 2013  Enter through the [employer] [facility] at: 6:00am  Pick up the phone at the desk and dial [phone].  Call this number if you have problems the morning of surgery: [phone].  Remember: Do NOT eat food: After midnight Monday Do NOT drink clear liquids after: After midnight Monday  Take these medicines the morning of surgery with a SIP OF WATER: Wellbutrin, Celexa *Stop St. John's Wort 03/20/2013  Do NOT wear jewelry (body piercing), make-up, or nail polish. Do NOT wear lotions, powders, or perfumes.  You may wear deoderant. Do NOT shave for 48 hours prior to surgery. Do NOT bring valuables to the hospital. Contacts, dentures, or bridgework may not be worn into surgery.  Have a responsible adult drive you home and stay with you for 24 hours after your procedure

## 2013-03-21 NOTE — Telephone Encounter (Signed)
Pt called back this am regarding message sent yest.  She was informed that Dr. Beryle Beams gave OK to restart tamoxifen.

## 2013-03-27 ENCOUNTER — Encounter (HOSPITAL_COMMUNITY): Admission: RE | Payer: Self-pay | Source: Ambulatory Visit

## 2013-03-27 SURGERY — DILATATION & CURETTAGE/HYSTEROSCOPY WITH RESECTOCOPE
Anesthesia: Choice

## 2013-03-28 ENCOUNTER — Ambulatory Visit (HOSPITAL_COMMUNITY)
Admission: RE | Admit: 2013-03-28 | Payer: BC Managed Care – PPO | Source: Ambulatory Visit | Admitting: Obstetrics & Gynecology

## 2013-03-29 ENCOUNTER — Encounter: Payer: Self-pay | Admitting: *Deleted

## 2013-03-29 NOTE — Progress Notes (Signed)
RECEIVED A FAX FROM [facility] CONCERNING A CONFIRMATION OF PRESCRIPTION SHIPMENT FOR PROMACTA ON 03/27/13.

## 2013-03-30 ENCOUNTER — Encounter: Payer: Self-pay | Admitting: *Deleted

## 2013-03-30 NOTE — Progress Notes (Signed)
Subjective:     Patient ID: Yvonne Hoover, female   DOB: 28-May-1960, 53 y.o.   MRN: [id number]  HPI     53 y.o.Divorcedfemale here to discuss upcoming procedure.  Pt is scheduled for hysteroscopic fibroid resection next week due to DUB.  Pt is on Tamoxifen and has experieinced bleeding in 2013, 2014 and 2015.  Has been evaluated each time with sonohysterography and endometrial biopsy.  At last visit with Dr. Joan Flores, she was told she was probably perimenopausal and that this was contributing to the bleeding.  Pt went off tamoxifen for several weeks and FSH was drawn.  This was 23.  So it is quite common to continue having bleeding as she is not clearly in menopause.    She continues to think she may want to stop the Tamoxifen early, although I disagree with this.  I feel as long as she is willing to proceed with evaluation for bleeding, then she should continue the Tamoxifen.  I don't think stopping it will make the bleeding stop, quite honestly.  She is aware the current recommendation is for Tamoxifen for a total of 10 years.    Pt not sure she wants to proceed with surgery right now.  I cannot guarantee her that the bleeding will stop even with a hysteroscopic fibroid resection.  It is ok to continue to watch and evaluate bleeding as it occurs.  She is comfortable with this.  Will discuss with "new" oncologist at next appt.  All questions answered.    Review of Systems  All other systems reviewed and are negative.       Objective:   Physical Exam  Constitutional: She is oriented to person, place, and time. She appears well-developed and well-nourished.  Neurological: She is alert and oriented to person, place, and time.       Assessment:     Perimenopausal DUB Submucosal fibroid     Plan:     Called OR and cancelled surgery for now.  Pt knows it can be rescheduled at anytime.  She will let me know if changes her mind.  Also, knows to call with any future bleeding.  Will discuss  Tamoxifen use with "new" oncologist at next visit.   ~15 minutes spent with patient >50% of time was in face to face discussion of above.

## 2013-03-30 NOTE — Progress Notes (Deleted)
Patient ID: Yvonne Hoover, female   DOB: May 18, 1960, 53 y.o.   MRN: [id number]  53 yo

## 2013-03-30 NOTE — Progress Notes (Signed)
Dr. Alvy Bimler completed and signed a Physician Information form from [facility] [employer] [place] [employer] for pt's Promacta.  Faxed form to them at fax [phone].

## 2013-04-05 ENCOUNTER — Telehealth: Payer: Self-pay | Admitting: *Deleted

## 2013-04-05 NOTE — Telephone Encounter (Signed)
Call from Anthony Sar at [place]  Requesting Dx code for Yvonne Hoover.  States PAF listed Dx as Breast Cancer.  Informed her we faxed physician Information form that states the Dx is ITP, but at the top of the form it was already printed as Breast Cancer and pt does have hx of Breast Cancer but is being treated for ITP.  She asked [place] to fax her that form w/ the correct diagnosis.  Faxed the physician form w/ ITP diagnosis to [facility] at [facility] fax # [phone].

## 2013-04-09 ENCOUNTER — Institutional Professional Consult (permissible substitution): Payer: BC Managed Care – PPO | Admitting: Obstetrics & Gynecology

## 2013-04-10 ENCOUNTER — Ambulatory Visit: Payer: BC Managed Care – PPO | Admitting: Obstetrics & Gynecology

## 2013-04-19 ENCOUNTER — Other Ambulatory Visit (HOSPITAL_BASED_OUTPATIENT_CLINIC_OR_DEPARTMENT_OTHER): Payer: BC Managed Care – PPO

## 2013-04-19 ENCOUNTER — Telehealth: Payer: Self-pay | Admitting: *Deleted

## 2013-04-19 ENCOUNTER — Encounter: Payer: Self-pay | Admitting: *Deleted

## 2013-04-19 DIAGNOSIS — D693 Immune thrombocytopenic purpura: Secondary | ICD-10-CM

## 2013-04-19 DIAGNOSIS — C50419 Malignant neoplasm of upper-outer quadrant of unspecified female breast: Secondary | ICD-10-CM

## 2013-04-19 LAB — CBC WITH DIFFERENTIAL/PLATELET
BASO%: 1.2 % (ref 0.0–2.0)
Basophils Absolute: 0.1 10*3/uL (ref 0.0–0.1)
EOS%: 2 % (ref 0.0–7.0)
Eosinophils Absolute: 0.1 10*3/uL (ref 0.0–0.5)
HCT: 39 % (ref 34.8–46.6)
HGB: 13 g/dL (ref 11.6–15.9)
LYMPH%: 27.7 % (ref 14.0–49.7)
MCH: 29.3 pg (ref 25.1–34.0)
MCHC: 33.3 g/dL (ref 31.5–36.0)
MCV: 87.8 fL (ref 79.5–101.0)
MONO#: 0.9 10*3/uL (ref 0.1–0.9)
MONO%: 14.2 % — ABNORMAL HIGH (ref 0.0–14.0)
NEUT#: 3.5 10*3/uL (ref 1.5–6.5)
NEUT%: 54.9 % (ref 38.4–76.8)
Platelets: 152 10*3/uL (ref 145–400)
RBC: 4.44 10*6/uL (ref 3.70–5.45)
RDW: 14.9 % — ABNORMAL HIGH (ref 11.2–14.5)
WBC: 6.4 10*3/uL (ref 3.9–10.3)
lymph#: 1.8 10*3/uL (ref 0.9–3.3)
nRBC: 0 % (ref 0–0)

## 2013-04-19 NOTE — Telephone Encounter (Signed)
Patrice with [employer] called requesting form for the patient [employer].  Patient uses [employer] with copay assistance.  The form is too dark to read.  Will notify collaborative nurse to locate the form and re-fax.  Sharl Ma may be reached at [phone].

## 2013-04-19 NOTE — Progress Notes (Signed)
Re-faxed Physician information form to [facility] at fax [phone].  She was unable to read the one I faxed on 04/05/13.

## 2013-04-23 ENCOUNTER — Telehealth: Payer: Self-pay | Admitting: *Deleted

## 2013-04-23 NOTE — Telephone Encounter (Signed)
Message copied by Norville Haggard P on Mon Apr 23, 2013 10:14 AM ------      Message from: Annia Belt      Created: Thu Apr 19, 2013  3:19 PM       Call pt: platelet count 152,000 ------

## 2013-04-23 NOTE — Telephone Encounter (Signed)
Per Dr. Beryle Beams; notified pt of platelets 152.  Pt verbalized understanding and confirmed appt with Dr. Alvy Bimler 06/18/13.

## 2013-04-24 ENCOUNTER — Encounter: Payer: Self-pay | Admitting: Hematology and Oncology

## 2013-04-24 NOTE — Progress Notes (Signed)
Spoke with [employer] for copay asst for the patient. She has spoken with her and she is getting the app back to them for her co pay to be 25.00. I called and left the patient to make sure she gets app back--turn around is 48hours per Ms. Evans at [phone]. She has spoke with the patient on today.

## 2013-04-25 ENCOUNTER — Telehealth: Payer: Self-pay | Admitting: *Deleted

## 2013-04-25 NOTE — Telephone Encounter (Signed)
Patient called with questions about forms for copay assistance.  Was able to help her with information.  She will contact Carmelina Noun for direction on who to put down for MD.  She has not seen Dr. Alvy Bimler yet.  She appreciated the quick call back.

## 2013-04-27 ENCOUNTER — Other Ambulatory Visit: Payer: Self-pay | Admitting: *Deleted

## 2013-04-27 DIAGNOSIS — D693 Immune thrombocytopenic purpura: Secondary | ICD-10-CM

## 2013-04-27 MED ORDER — ELTROMBOPAG OLAMINE 50 MG PO TABS: 50.0000 mg | ORAL_TABLET | Freq: Every day | ORAL | Status: DC

## 2013-04-27 NOTE — Telephone Encounter (Signed)
Received a fax request for new rx for promacta from [facility].  Called [facility] and spoke with [place].  They need the patient to call [facility] and ask them to reverse the claim they have on the refill because PT is getting the message that it is too early for a refill.  Alison Murray said they have all they need to fill the prescription - they just need the claim lifted.   Called patient and left a message on her VM.  Also PT has a different address for patient [address].  [place] [phone].  Let patient know that one of [place] has an incorrect address for her and to please verify the address with which ever one is incorrect. I gave her the address we have listed for her.

## 2013-04-27 NOTE — Telephone Encounter (Signed)
Patient called and requested that new script for Promacta be sent to [place] phone [phone] because of insurance/co-pay issues.  She will run out after Saturday.  [facility] will get it to her on Tuesday 05/01/13.  She will call [facility] and see if she can get #3  Promacta to last her through Tuesday. Remaining refills from script done 07/21/12 escribed to [facility]. Patient called me back and she has "everything worked outBuilding control surveyor will get her rx there on Monday, so she will just miss one day.  Let her know that I have escribed Promacta to [facility].  She appreciated my help.

## 2013-05-18 ENCOUNTER — Telehealth: Payer: Self-pay | Admitting: Obstetrics & Gynecology

## 2013-05-18 NOTE — Telephone Encounter (Signed)
Patient calling to report she is having a period again and states, "I am not supposed to have a period anymore and I need Dr. Ammie Ferrier advice." Please advise?

## 2013-05-18 NOTE — Telephone Encounter (Signed)
Spoke with patient. Patient states that she has been taking Tamoxifen for breast cancer and has been experiencing bleeding for a while that Dr.Miller has been evaluating. Patient was last seen in March to discuss D&C. Patient has had SHGM and EMB in 2013, 2014, and 2015. States she decided not to proceed with surgery and was going to monitor her bleeding. Patient started bleeding last night "It is light bleeding like right before your normal period. I am having some back pain and cramping like you have with your period. I am wondering if the back pain could be due to where the cyst is located." Patient states that she is calling just to let [place] know and to get her advice on what she needs to do. Patient states she is still on the fence about surgery but if Dr.Miller thinks that it is important at this time then she would like to speak with her about it again. Advised would send a message to [place] regarding bleeding and give patient a call back with further instructions and advice.

## 2013-05-23 NOTE — Telephone Encounter (Signed)
Left message to call Kaitlyn at [phone]. 

## 2013-05-23 NOTE — Telephone Encounter (Signed)
Gay Filler, Can you please call this pt.  She was scheduled for surgery earlier this year.  She cancelled due to cost.  She has a hx of breast cancer and was taking tamoxifen.  She has a fibroid which is what I think is causing the bleeding.  She was debating about stopping the tamoxifen so see if she continued to bleed.  If she did indeed stop the tamoxifen, I really think she should just do the surgery.  I really think this would fix the bleeding.  Thanks.

## 2013-05-24 NOTE — Telephone Encounter (Signed)
Call to patient, LMTCB

## 2013-05-25 NOTE — Telephone Encounter (Signed)
Patient returned call. Message from Dr. Sabra Heck given. Patient states that she she is on tamoxifen, she does not want to stop it at this time. She states "I was just basically calling to let Dr. Sabra Heck know I was bleeding again, just so we could keep tabs on the bleeding."  She states she has not changed her mind about surgery.  She will continue to monitor bleeding and if continues will call back.

## 2013-05-29 NOTE — Telephone Encounter (Signed)
I think ok to just let her decide.  She has had a negative endometrial biopsy this year.  OK to close encounter.

## 2013-05-29 NOTE — Telephone Encounter (Signed)
Dr Sabra Heck,  Since patient declines to move forward with surgery, when should we follow-up?

## 2013-06-18 ENCOUNTER — Encounter: Payer: Self-pay | Admitting: Hematology and Oncology

## 2013-06-18 ENCOUNTER — Other Ambulatory Visit (HOSPITAL_BASED_OUTPATIENT_CLINIC_OR_DEPARTMENT_OTHER): Payer: BC Managed Care – PPO

## 2013-06-18 ENCOUNTER — Ambulatory Visit (HOSPITAL_BASED_OUTPATIENT_CLINIC_OR_DEPARTMENT_OTHER): Payer: BC Managed Care – PPO | Admitting: Hematology and Oncology

## 2013-06-18 ENCOUNTER — Telehealth: Payer: Self-pay | Admitting: *Deleted

## 2013-06-18 VITALS — BP 130/52 | HR 63 | Temp 97.9°F | Resp 18 | Ht 67.0 in | Wt 152.0 lb

## 2013-06-18 DIAGNOSIS — C50419 Malignant neoplasm of upper-outer quadrant of unspecified female breast: Secondary | ICD-10-CM

## 2013-06-18 DIAGNOSIS — N924 Excessive bleeding in the premenopausal period: Secondary | ICD-10-CM

## 2013-06-18 DIAGNOSIS — Z170 Estrogen receptor positive status [ER+]: Secondary | ICD-10-CM

## 2013-06-18 DIAGNOSIS — C50919 Malignant neoplasm of unspecified site of unspecified female breast: Secondary | ICD-10-CM

## 2013-06-18 DIAGNOSIS — D693 Immune thrombocytopenic purpura: Secondary | ICD-10-CM

## 2013-06-18 LAB — COMPREHENSIVE METABOLIC PANEL (CC13)
ALT: 12 U/L (ref 0–55)
AST: 18 U/L (ref 5–34)
Albumin: 3.3 g/dL — ABNORMAL LOW (ref 3.5–5.0)
Alkaline Phosphatase: 55 U/L (ref 40–150)
Anion Gap: 6 mEq/L (ref 3–11)
BUN: 10 mg/dL (ref 7.0–26.0)
CO2: 28 mEq/L (ref 22–29)
Calcium: 8.7 mg/dL (ref 8.4–10.4)
Chloride: 108 mEq/L (ref 98–109)
Creatinine: 0.8 mg/dL (ref 0.6–1.1)
Glucose: 74 mg/dl (ref 70–140)
Potassium: 4.2 mEq/L (ref 3.5–5.1)
Sodium: 142 mEq/L (ref 136–145)
Total Bilirubin: 0.42 mg/dL (ref 0.20–1.20)
Total Protein: 6.6 g/dL (ref 6.4–8.3)

## 2013-06-18 LAB — CBC WITH DIFFERENTIAL/PLATELET
BASO%: 3.2 % — ABNORMAL HIGH (ref 0.0–2.0)
Basophils Absolute: 0.2 10*3/uL — ABNORMAL HIGH (ref 0.0–0.1)
EOS%: 2.4 % (ref 0.0–7.0)
Eosinophils Absolute: 0.2 10*3/uL (ref 0.0–0.5)
HCT: 39.1 % (ref 34.8–46.6)
HGB: 12.5 g/dL (ref 11.6–15.9)
LYMPH%: 23.6 % (ref 14.0–49.7)
MCH: 29 pg (ref 25.1–34.0)
MCHC: 32 g/dL (ref 31.5–36.0)
MCV: 90.9 fL (ref 79.5–101.0)
MONO#: 0.7 10*3/uL (ref 0.1–0.9)
MONO%: 10.6 % (ref 0.0–14.0)
NEUT#: 4 10*3/uL (ref 1.5–6.5)
NEUT%: 60.2 % (ref 38.4–76.8)
Platelets: 281 10*3/uL (ref 145–400)
RBC: 4.3 10*6/uL (ref 3.70–5.45)
RDW: 16 % — ABNORMAL HIGH (ref 11.2–14.5)
WBC: 6.7 10*3/uL (ref 3.9–10.3)
lymph#: 1.6 10*3/uL (ref 0.9–3.3)

## 2013-06-18 NOTE — Progress Notes (Signed)
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 female was transferred to my care after her prior physician has left.  I reviewed the patient's records extensive and collaborated the history with the patient. 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 She was put on N-plate given between May 2009 and November 2012 and had a excellent response. She was changed to [facility] in September 2012. She has also achieved a sustained response with platelet count consistently over 100,000 on this medication.   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 She was treated with lumpectomy. Tumor was close to the nipple areolar complex which was removed with the surgery. She received postop radiation. She was put on tamoxifen hormonal therapy which she continues at this time. She had an endometrial biopsy 10/29/2012 for dysfunctional uterine bleeding. No signs of malignancy. Tamoxifen resumed.  From some bruising, she has no spontaneous bleeding. The patient denies any recent signs or symptoms of bleeding such as spontaneous epistaxis, hematuria or hematochezia. She denies any recent abnormal breast examination, palpable mass, abnormal breast appearance or nipple changes  MEDICAL HISTORY:  Past Medical History  Diagnosis Date  . Depression with anxiety 11/16/2010  . Bronchiolitis   . Abnormal uterine bleeding   . Depression   . ITP (idiopathic thrombocytopenic purpura)   . Alcohol abuse, in remission   . Fibroid 1992  . STD (sexually transmitted disease)      positive HPV  . Cancer 07/2007    breast  . Cellulitis     SURGICAL HISTORY: Past Surgical History  Procedure Laterality Date  . Bone marrow biopsy  09/2000    back hip  . Breast surgery  01/2000     left breast mass/ benign  . Endometrial biopsy    . Ear bone surgery      SOCIAL HISTORY: History   Social History  . Marital Status: Divorced    Spouse Name: N/A    Number of Children: N/A  . Years of Education: N/A   Occupational History  . Not on file.   Social History Main Topics  . Smoking status: Former Smoker    Quit date: 11/04/2012  . Smokeless tobacco: Never Used     Comment: very rare  . Alcohol Use: No  . Drug Use: No  . Sexual Activity: Yes    Partners: Male    Birth Control/ Protection: None   Other Topics Concern  . Not on file   Social History Narrative  . No narrative on file    FAMILY HISTORY: Family History  Problem Relation Age of Onset  . Hypertension Mother   . Thyroid disease Mother   . Irregular heart beat Father   . Breast cancer Maternal Aunt   . Tuberculosis Maternal Grandmother   . Diabetes Paternal Grandmother   . Cancer Maternal Grandfather     lung    ALLERGIES:  has No Known Allergies.  MEDICATIONS:  Current Outpatient Prescriptions  Medication Sig Dispense Refill  . buPROPion (WELLBUTRIN) 100 MG tablet Take 100 mg by mouth 2 (two) times  daily.      . Cholecalciferol 5000 UNITS TABS Take 1 tablet by mouth daily.      . citalopram (CELEXA) 40 MG tablet Take by mouth Daily.       Marland Kitchen eltrombopag (PROMACTA) 50 MG tablet Take 1 tablet (50 mg total) by mouth daily.  30 tablet  1  . Omega 3 1200 MG CAPS Take 1 capsule by mouth daily.      . tamoxifen (NOLVADEX) 20 MG tablet TAKE ONE TABLET DAILY  90 tablet  0   No current facility-administered medications for this visit.    REVIEW OF SYSTEMS:   Constitutional: Denies fevers, chills or abnormal night sweats Eyes: Denies blurriness of vision, double vision or watery  eyes Ears, nose, mouth, throat, and face: Denies mucositis or sore throat Respiratory: Denies cough, dyspnea or wheezes Cardiovascular: Denies palpitation, chest discomfort or lower extremity swelling Gastrointestinal:  Denies nausea, heartburn or change in bowel habits Skin: Denies abnormal skin rashes Lymphatics: Denies new lymphadenopathy or easy bruising Neurological:Denies numbness, tingling or new weaknesses Behavioral/Psych: Mood is stable, no new changes  All other systems were reviewed with the patient and are negative.  PHYSICAL EXAMINATION: ECOG PERFORMANCE STATUS: 0 - Asymptomatic  Filed Vitals:   06/18/13 0938  BP: 130/52  Pulse: 63  Temp: 97.9 F (36.6 C)  Resp: 18   Filed Weights   06/18/13 0938  Weight: 152 lb (68.947 kg)    GENERAL:alert, no distress and comfortable SKIN: skin color, texture, turgor are normal, no rashes or significant lesions EYES: normal, conjunctiva are pink and non-injected, sclera clear OROPHARYNX:no exudate, normal lips, buccal mucosa, and tongue  NECK: supple, thyroid normal size, non-tender, without nodularity LYMPH:  no palpable lymphadenopathy in the cervical, axillary or inguinal LUNGS: clear to auscultation and percussion with normal breathing effort HEART: regular rate & rhythm and no murmurs without lower extremity edema ABDOMEN:abdomen soft, non-tender and normal bowel sounds Musculoskeletal:no cyanosis of digits and no clubbing  PSYCH: alert & oriented x 3 with fluent speech NEURO: no focal motor/sensory deficits Bilateral breast examinations were performed. Well-healed lumpectomy scar with no other palpable abnormalities.  LABORATORY DATA:  I have reviewed the data as listed Lab Results  Component Value Date   WBC 6.7 06/18/2013   HGB 12.5 06/18/2013   HCT 39.1 06/18/2013   MCV 90.9 06/18/2013   PLT 281 06/18/2013    Recent Labs  09/07/12 1107 02/19/13 0846 03/10/13 1357 06/18/13 0920  NA 144 141 138 142  K 4.4  4.3 4.2 4.2  CL  --   --  101  --   CO2 '28 27 26 28  ' GLUCOSE 63* 99 115* 74  BUN 11.9 8.4 8 10.0  CREATININE 0.7 0.7 0.64 0.8  CALCIUM 8.9 9.1 9.2 8.7  GFRNONAA  --   --  >90  --   GFRAA  --   --  >90  --   PROT 6.9 6.2*  --  6.6  ALBUMIN 3.4* 3.4*  --  3.3*  AST 23 21  --  18  ALT 14 13  --  12  ALKPHOS 58 47  --  55  BILITOT 0.36 0.50  --  0.42    ASSESSMENT & PLAN:  Breast cancer, stage 1 The patient has completed more than 5 years of adjuvant endocrine therapy. She has side effects including depression, hot flashes and postmenopausal/dysfunctional uterine bleeding requiring multiple endometrial biopsies. I discussed with her the risk, benefits, side effects of continuing adjuvant  endocrine therapy for another 5 years versus switching her over to aromatase inhibitor versus discontinuation of treatment. The patient elected to discontinue treatment altogether. I recommend followup with history, physical examination and yearly mammogram. Examination today is benign.  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 She is doing very well with current treatment with Promacta. I recommend continuing the same without dose adjustment. Due to stability of her platelet counts, I recommend followup in 6 months with history, physical examination and blood work  Abnormal perimenopausal bleeding This could be triggered by tamoxifen. All her recent biopsies were benign.    Orders Placed This Encounter  Procedures  . CBC with Differential    Standing Status: Future     Number of Occurrences:      Standing Expiration Date: 06/18/2014  . Comprehensive metabolic panel    Standing Status: Future     Number of Occurrences:      Standing Expiration Date: 06/18/2014    All questions were answered. The patient knows to call the clinic with any problems, questions or concerns. I spent 25 minutes counseling the patient face to face. The total time  spent in the appointment was 30 minutes and more than 50% was on counseling.     [place], [place], MD 06/18/2013 9:30 PM

## 2013-06-18 NOTE — Telephone Encounter (Signed)
Tried to contact pt via mobile phone again approx 3P, 4P, and 5:45 PM - left another message at 5:45 PM to call Dr. Azucena Freed office at [phone] and ask for nurse named Ivin Booty any time after 8:30 AM on Tues, June 16. Yvonna Alanis, RN, 06/18/13, 5:46PM

## 2013-06-18 NOTE — Assessment & Plan Note (Signed)
This could be triggered by tamoxifen. All her recent biopsies were benign.

## 2013-06-18 NOTE — Assessment & Plan Note (Signed)
She has failed splenectomy, prednisone, and rituximab. She is doing very well with current treatment with Promacta. I recommend continuing the same without dose adjustment. Due to stability of her platelet counts, I recommend followup in 6 months with history, physical examination and blood work  

## 2013-06-18 NOTE — Assessment & Plan Note (Signed)
The patient has completed more than 5 years of adjuvant endocrine therapy. She has side effects including depression, hot flashes and postmenopausal/dysfunctional uterine bleeding requiring multiple endometrial biopsies. I discussed with her the risk, benefits, side effects of continuing adjuvant endocrine therapy for another 5 years versus switching her over to aromatase inhibitor versus discontinuation of treatment. The patient elected to discontinue treatment altogether. I recommend followup with history, physical examination and yearly mammogram. Examination today is benign.

## 2013-06-19 NOTE — Telephone Encounter (Signed)
Pt notified that Platelet count was "great - 281,000" - pt verbalized understanding of this info and stated she had seen Dr. Alvy Bimler yesterday, when she had also gotten the results. Yvonna Alanis, RN, 06/19/13, 12:42 PM

## 2013-06-20 ENCOUNTER — Telehealth: Payer: Self-pay | Admitting: Hematology and Oncology

## 2013-06-20 NOTE — Telephone Encounter (Signed)
lvm for pt regardign to Dec appt...mailed pt appt sched/avs adn letter

## 2013-07-16 ENCOUNTER — Ambulatory Visit (INDEPENDENT_AMBULATORY_CARE_PROVIDER_SITE_OTHER): Payer: BC Managed Care – PPO | Admitting: Certified Nurse Midwife

## 2013-07-16 ENCOUNTER — Encounter: Payer: Self-pay | Admitting: Certified Nurse Midwife

## 2013-07-16 VITALS — BP 110/80 | HR 70 | Resp 16 | Ht 67.25 in | Wt 156.0 lb

## 2013-07-16 DIAGNOSIS — B3731 Acute candidiasis of vulva and vagina: Secondary | ICD-10-CM

## 2013-07-16 DIAGNOSIS — B373 Candidiasis of vulva and vagina: Secondary | ICD-10-CM

## 2013-07-16 DIAGNOSIS — R350 Frequency of micturition: Secondary | ICD-10-CM

## 2013-07-16 LAB — POCT URINALYSIS DIPSTICK
Bilirubin, UA: NEGATIVE
Blood, UA: NEGATIVE
Glucose, UA: NEGATIVE
Ketones, UA: NEGATIVE
Nitrite, UA: NEGATIVE
Protein, UA: NEGATIVE
Urobilinogen, UA: NEGATIVE
pH, UA: 5

## 2013-07-16 MED ORDER — TERCONAZOLE 0.4 % VA CREA: 1.0000 | TOPICAL_CREAM | Freq: Every day | VAGINAL | Status: DC

## 2013-07-16 NOTE — Addendum Note (Signed)
Addended by: Regina Eck on: 07/16/2013 05:13 PM   Modules accepted: Orders

## 2013-07-16 NOTE — Progress Notes (Signed)
Reviewed personally.  M. Suzanne Markie Frith, MD.  

## 2013-07-16 NOTE — Progress Notes (Signed)
53 y.o. single white female g2p2002 here with complaint of vaginal symptoms of itching, burning, and increase discharge. Describes discharge as watery discharge with some odor. Onset of symptoms 7 days ago. Denies new personal products.  Some vaginal dryness uses Olive Oil. Works in [employer] at her job so perspires heavily. No STD concerns. Urinary symptoms none .   O:Healthy female WDWN Affect: normal, orientation x 3  Exam: Abdomen: non tender Lymph node: no enlargement or tenderness Pelvic exam: External genital:  BUS: negative Vagina: white thick discharge noted. Ph:4.5   ,Wet prep taken Cervix: normal, non tender Uterus: normal, non tender Adnexa:normal, non tender, no masses or fullness noted  Poct urine-wbc 1+ Wet Prep results:Yeast   A:Yeast vaginitis History of Breast cancer, now off Tamoxifen for one month, last bleeding episode 1/15 R/O UTI   P:Discussed findings of yeast vaginitis and etiology. Discussed Aveeno or baking soda sitz bath for comfort. Avoid moist clothes or pads for extended period of time. If working out in gym clothes or swim suits for long periods of time change underwear or bottoms of swimsuit if possible. Olive Oil use for skin protection prior to activity can be used to external skin. Rx: Terazol 7 vaginal cream see order Aware of UTI symptoms and will advise if occurs Lab Urine micro  Rv prn

## 2013-07-16 NOTE — Patient Instructions (Signed)

## 2013-07-17 LAB — URINALYSIS, MICROSCOPIC ONLY
Bacteria, UA: NONE SEEN
Casts: NONE SEEN
Crystals: NONE SEEN
Squamous Epithelial / LPF: NONE SEEN

## 2013-07-23 ENCOUNTER — Other Ambulatory Visit: Payer: Self-pay | Admitting: *Deleted

## 2013-07-23 DIAGNOSIS — D693 Immune thrombocytopenic purpura: Secondary | ICD-10-CM

## 2013-07-23 MED ORDER — ELTROMBOPAG OLAMINE 50 MG PO TABS: 50.0000 mg | ORAL_TABLET | Freq: Every day | ORAL | Status: DC

## 2013-07-23 NOTE — Telephone Encounter (Signed)
THIS REFILL REQUEST FOR PROMACTA WAS PLACED ON DR.GORSUCH'S DESK.

## 2013-07-23 NOTE — Addendum Note (Signed)
Addended by: Wyonia Hough on: 07/23/2013 04:05 PM   Modules accepted: Orders

## 2013-07-27 ENCOUNTER — Other Ambulatory Visit: Payer: Self-pay | Admitting: *Deleted

## 2013-07-27 ENCOUNTER — Encounter: Payer: Self-pay | Admitting: Hematology and Oncology

## 2013-07-27 DIAGNOSIS — D693 Immune thrombocytopenic purpura: Secondary | ICD-10-CM

## 2013-07-27 MED ORDER — ELTROMBOPAG OLAMINE 50 MG PO TABS: 50.0000 mg | ORAL_TABLET | Freq: Every day | ORAL | Status: DC

## 2013-07-27 NOTE — Telephone Encounter (Signed)
Rx for Promacta left for Ebony in managed care dept to fax to specialty pharmacy.  She states rx was sent to [facility] but it needs to go to another pharmacy.

## 2013-07-27 NOTE — Progress Notes (Signed)
Faxed promacta prescription to [facility]

## 2013-08-01 ENCOUNTER — Telehealth: Payer: Self-pay | Admitting: *Deleted

## 2013-08-01 NOTE — Telephone Encounter (Signed)
No need to worry even she missed up to 1 week supply

## 2013-08-01 NOTE — Telephone Encounter (Signed)
Pt reports she is supposed to be getting her delivery of Promacta tonight.  There was a delay in getting her refill due to insurance issues and having to change specialty pharmacies.  Pt wants Dr. Alvy Bimler to be aware she will have missed 3 days of Promacta.  Pt asks if this will be ok or any special follow up she will need due to the missed doses?

## 2013-08-01 NOTE — Telephone Encounter (Signed)
Informed pt of Dr. Calton Dach reply below.  She verbalized understanding.

## 2013-08-14 ENCOUNTER — Encounter: Payer: Self-pay | Admitting: *Deleted

## 2013-08-14 ENCOUNTER — Encounter: Payer: Self-pay | Admitting: Hematology and Oncology

## 2013-08-14 NOTE — Progress Notes (Signed)
[employer] approved promacta from 08/14/13-08/15/18

## 2013-08-14 NOTE — Progress Notes (Signed)
RECEIVED A FAX FROM [facility] CONCERNING A NOTICE OF RX TRANSFER TO [facility].

## 2013-08-21 ENCOUNTER — Other Ambulatory Visit: Payer: Self-pay | Admitting: Hematology and Oncology

## 2013-08-23 ENCOUNTER — Other Ambulatory Visit: Payer: Self-pay | Admitting: *Deleted

## 2013-08-23 NOTE — Telephone Encounter (Signed)
Faxed refill on Promacta to [facility] [phone]

## 2013-10-09 ENCOUNTER — Telehealth: Payer: Self-pay | Admitting: *Deleted

## 2013-10-09 NOTE — Telephone Encounter (Signed)
Pt reports swelling in right hand and knuckles.  States painful.  Asks if it could be arthritis or side effect of Promacta?   Asks what she can take for the pain,, was advised to avoid Aspirin and Ibuprofen?

## 2013-10-09 NOTE — Telephone Encounter (Signed)
Not to my knowledge. She should get PCP to look at it in case she may have undiagnosed autoimmune arthritis or gout

## 2013-10-10 NOTE — Telephone Encounter (Signed)
Informed pt of Dr. Calton Dach message below.  Instructed her to contact PCP to assess her pain and swelling in hands.  She verbalized understanding.

## 2013-10-16 ENCOUNTER — Telehealth: Payer: Self-pay | Admitting: Certified Nurse Midwife

## 2013-10-16 NOTE — Telephone Encounter (Signed)
Message left to return call to Eesha Schmaltz at [phone].    

## 2013-10-16 NOTE — Telephone Encounter (Signed)
Patient calling to speak with the nurse about "bleeding after sex."

## 2013-10-23 NOTE — Telephone Encounter (Signed)
Message left to return call to Bayli Quesinberry at [phone].    

## 2013-11-01 ENCOUNTER — Encounter: Payer: Self-pay | Admitting: Emergency Medicine

## 2013-11-01 NOTE — Telephone Encounter (Signed)
Yes.  Encounter closed.  Remove from inbox.

## 2013-11-01 NOTE — Telephone Encounter (Signed)
Message left to return call to [place] at [phone].   Attempted call x 3.  Letter sent.  Okay to close?

## 2013-11-05 ENCOUNTER — Encounter: Payer: Self-pay | Admitting: Certified Nurse Midwife

## 2013-11-12 ENCOUNTER — Other Ambulatory Visit: Payer: Self-pay | Admitting: Oncology

## 2013-12-20 ENCOUNTER — Telehealth: Payer: Self-pay | Admitting: Hematology and Oncology

## 2013-12-20 ENCOUNTER — Other Ambulatory Visit (HOSPITAL_BASED_OUTPATIENT_CLINIC_OR_DEPARTMENT_OTHER): Payer: 59

## 2013-12-20 ENCOUNTER — Encounter: Payer: Self-pay | Admitting: Hematology and Oncology

## 2013-12-20 ENCOUNTER — Ambulatory Visit (HOSPITAL_BASED_OUTPATIENT_CLINIC_OR_DEPARTMENT_OTHER): Payer: 59 | Admitting: Hematology and Oncology

## 2013-12-20 VITALS — BP 134/75 | HR 64 | Temp 98.4°F | Resp 18 | Ht 67.25 in | Wt 150.8 lb

## 2013-12-20 DIAGNOSIS — Z853 Personal history of malignant neoplasm of breast: Secondary | ICD-10-CM

## 2013-12-20 DIAGNOSIS — C50919 Malignant neoplasm of unspecified site of unspecified female breast: Secondary | ICD-10-CM

## 2013-12-20 DIAGNOSIS — D693 Immune thrombocytopenic purpura: Secondary | ICD-10-CM

## 2013-12-20 LAB — COMPREHENSIVE METABOLIC PANEL (CC13)
ALT: 12 U/L (ref 0–55)
AST: 19 U/L (ref 5–34)
Albumin: 3.5 g/dL (ref 3.5–5.0)
Alkaline Phosphatase: 73 U/L (ref 40–150)
Anion Gap: 8 mEq/L (ref 3–11)
BUN: 6.9 mg/dL — ABNORMAL LOW (ref 7.0–26.0)
CO2: 28 mEq/L (ref 22–29)
Calcium: 9.3 mg/dL (ref 8.4–10.4)
Chloride: 105 mEq/L (ref 98–109)
Creatinine: 0.7 mg/dL (ref 0.6–1.1)
EGFR: >90 mL/min/{1.73_m2} (ref >90)
Glucose: 60 mg/dl — ABNORMAL LOW (ref 70–140)
Potassium: 4.1 mEq/L (ref 3.5–5.1)
Sodium: 141 mEq/L (ref 136–145)
Total Bilirubin: 0.39 mg/dL (ref 0.20–1.20)
Total Protein: 6.7 g/dL (ref 6.4–8.3)

## 2013-12-20 LAB — CBC WITH DIFFERENTIAL/PLATELET
BASO%: 1.9 % (ref 0.0–2.0)
Basophils Absolute: 0.1 10*3/uL (ref 0.0–0.1)
EOS%: 2.9 % (ref 0.0–7.0)
Eosinophils Absolute: 0.2 10*3/uL (ref 0.0–0.5)
HCT: 40 % (ref 34.8–46.6)
HGB: 13.1 g/dL (ref 11.6–15.9)
LYMPH%: 33.5 % (ref 14.0–49.7)
MCH: 28.3 pg (ref 25.1–34.0)
MCHC: 32.8 g/dL (ref 31.5–36.0)
MCV: 86.4 fL (ref 79.5–101.0)
MONO#: 0.6 10*3/uL (ref 0.1–0.9)
MONO%: 10.7 % (ref 0.0–14.0)
NEUT#: 3 10*3/uL (ref 1.5–6.5)
NEUT%: 51 % (ref 38.4–76.8)
Platelets: 224 10*3/uL (ref 145–400)
RBC: 4.63 10*6/uL (ref 3.70–5.45)
RDW: 15.3 % — ABNORMAL HIGH (ref 11.2–14.5)
WBC: 5.8 10*3/uL (ref 3.9–10.3)
lymph#: 1.9 10*3/uL (ref 0.9–3.3)

## 2013-12-20 NOTE — Progress Notes (Signed)
[facility] OFFICE PROGRESS NOTE  Patient Care Team: Lysle Pearl, MD as PCP - General (Family Medicine)  SUMMARY OF ONCOLOGIC HISTORY:  I reviewed the patient's records extensive and collaborated the history with the patient. 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 She was put on N-plate given between May 2009 and November 2012 and had a excellent response. She was changed to [facility] in September 2012. She has also achieved a sustained response with platelet count consistently over 100,000 on this medication.   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 She was treated with lumpectomy. Tumor was close to the nipple areolar complex which was removed with the surgery. She received postop radiation. She was put on tamoxifen hormonal therapy which she continues at this time. She had an endometrial biopsy 10/29/2012 for dysfunctional uterine bleeding. No signs of malignancy. Tamoxifen resumed until June 2015.  INTERVAL HISTORY: Please see below for problem oriented charting. She has no spontaneous bleeding. The patient denies any recent signs or symptoms of bleeding such as spontaneous epistaxis, hematuria or hematochezia. She denies any recent abnormal breast examination, palpable mass, abnormal breast appearance or nipple changes  REVIEW OF SYSTEMS:   Constitutional: Denies fevers, chills or abnormal weight loss Eyes: Denies blurriness of vision Ears, nose, mouth, throat, and face: Denies mucositis or sore throat Respiratory: Denies cough, dyspnea or wheezes Cardiovascular: Denies palpitation, chest discomfort or lower extremity swelling Gastrointestinal:  Denies nausea, heartburn or change in bowel habits Skin: Denies abnormal  skin rashes Lymphatics: Denies new lymphadenopathy or easy bruising Neurological:Denies numbness, tingling or new weaknesses Behavioral/Psych: Mood is stable, no new changes  All other systems were reviewed with the patient and are negative.  I have reviewed the past medical history, past surgical history, social history and family history with the patient and they are unchanged from previous note.  ALLERGIES:  has No Known Allergies.  MEDICATIONS:  Current Outpatient Prescriptions  Medication Sig Dispense Refill  . buPROPion (WELLBUTRIN) 100 MG tablet Take 100 mg by mouth daily.     . Cholecalciferol 5000 UNITS TABS Take 1 tablet by mouth daily.    . citalopram (CELEXA) 40 MG tablet Take by mouth Daily.     . Omega 3 1200 MG CAPS Take 1 capsule by mouth daily.    Marland Kitchen PROMACTA 50 MG tablet Take 1 tablet by mouth  daily 30 tablet 5   No current facility-administered medications for this visit.    PHYSICAL EXAMINATION: ECOG PERFORMANCE STATUS: 0 - Asymptomatic  Filed Vitals:   12/20/13 1017  BP: 134/75  Pulse: 64  Temp: 98.4 F (36.9 C)  Resp: 18   Filed Weights   12/20/13 1017  Weight: 150 lb 12.8 oz (68.402 kg)    GENERAL:alert, no distress and comfortable SKIN: skin color, texture, turgor are normal, no rashes or significant lesions EYES: normal, Conjunctiva are pink and non-injected, sclera clear OROPHARYNX:no exudate, no erythema and lips, buccal mucosa, and tongue normal  NECK: supple, thyroid normal size, non-tender, without nodularity LYMPH:  no palpable lymphadenopathy in the cervical, axillary or inguinal LUNGS: clear to auscultation and percussion with normal breathing effort HEART: regular rate & rhythm and no murmurs and no lower extremity edema ABDOMEN:abdomen soft, non-tender and normal bowel sounds Musculoskeletal:no cyanosis of  digits and no clubbing  NEURO: alert & oriented x 3 with fluent speech, no focal motor/sensory deficits  LABORATORY DATA:  I  have reviewed the data as listed    Component Value Date/Time   NA 141 12/20/2013 1004   NA 138 03/10/2013 1357   K 4.1 12/20/2013 1004   K 4.2 03/10/2013 1357   CL 101 03/10/2013 1357   CL 107 02/16/2012 1436   CO2 28 12/20/2013 1004   CO2 26 03/10/2013 1357   GLUCOSE 60* 12/20/2013 1004   GLUCOSE 115* 03/10/2013 1357   GLUCOSE 83 02/16/2012 1436   BUN 6.9* 12/20/2013 1004   BUN 8 03/10/2013 1357   CREATININE 0.7 12/20/2013 1004   CREATININE 0.64 03/10/2013 1357   CALCIUM 9.3 12/20/2013 1004   CALCIUM 9.2 03/10/2013 1357   PROT 6.7 12/20/2013 1004   PROT 6.4 08/09/2011 1101   ALBUMIN 3.5 12/20/2013 1004   ALBUMIN 3.9 08/09/2011 1101   AST 19 12/20/2013 1004   AST 25 08/09/2011 1101   ALT 12 12/20/2013 1004   ALT 16 08/09/2011 1101   ALKPHOS 73 12/20/2013 1004   ALKPHOS 44 08/09/2011 1101   BILITOT 0.39 12/20/2013 1004   BILITOT 0.6 08/09/2011 1101   GFRNONAA >90 03/10/2013 1357   GFRAA >90 03/10/2013 1357    No results found for: SPEP, UPEP  Lab Results  Component Value Date   WBC 5.8 12/20/2013   NEUTROABS 3.0 12/20/2013   HGB 13.1 12/20/2013   HCT 40.0 12/20/2013   MCV 86.4 12/20/2013   PLT 224 12/20/2013      Chemistry      Component Value Date/Time   NA 141 12/20/2013 1004   NA 138 03/10/2013 1357   K 4.1 12/20/2013 1004   K 4.2 03/10/2013 1357   CL 101 03/10/2013 1357   CL 107 02/16/2012 1436   CO2 28 12/20/2013 1004   CO2 26 03/10/2013 1357   BUN 6.9* 12/20/2013 1004   BUN 8 03/10/2013 1357   CREATININE 0.7 12/20/2013 1004   CREATININE 0.64 03/10/2013 1357      Component Value Date/Time   CALCIUM 9.3 12/20/2013 1004   CALCIUM 9.2 03/10/2013 1357   ALKPHOS 73 12/20/2013 1004   ALKPHOS 44 08/09/2011 1101   AST 19 12/20/2013 1004   AST 25 08/09/2011 1101   ALT 12 12/20/2013 1004   ALT 16 08/09/2011 1101   BILITOT 0.39 12/20/2013 1004   BILITOT 0.6 08/09/2011 1101      ASSESSMENT & PLAN:  Immune thrombocytopenic purpura She has failed  splenectomy, prednisone, and rituximab. She is doing very well with current treatment with Promacta. I recommend continuing the same without dose adjustment. Due to stability of her platelet counts, I recommend followup in 6 months with history, physical examination and blood work  Breast cancer, stage 1 The patient has completed more than 5 years of adjuvant endocrine therapy. I recommend followup with history, physical examination and yearly mammogram.    Orders Placed This Encounter  Procedures  . CBC with Differential    Standing Status: Future     Number of Occurrences:      Standing Expiration Date: 01/24/2015   All questions were answered. The patient knows to call the clinic with any problems, questions or concerns. No barriers to learning was detected. I spent 15 minutes counseling the patient face to face. The total time spent in the appointment was 20 minutes and more than 50% was on counseling and review of test results  [place], [place], MD 12/20/2013 11:00 AM

## 2013-12-20 NOTE — Assessment & Plan Note (Signed)
She has failed splenectomy, prednisone, and rituximab. She is doing very well with current treatment with Promacta. I recommend continuing the same without dose adjustment. Due to stability of her platelet counts, I recommend followup in 6 months with history, physical examination and blood work  

## 2013-12-20 NOTE — Assessment & Plan Note (Signed)
The patient has completed more than 5 years of adjuvant endocrine therapy. I recommend followup with history, physical examination and yearly mammogram.

## 2013-12-20 NOTE — Telephone Encounter (Signed)
Gave avs & cal for June. °

## 2014-02-06 ENCOUNTER — Telehealth: Payer: Self-pay | Admitting: Hematology and Oncology

## 2014-02-06 NOTE — Telephone Encounter (Signed)
lvm for pt regarding to 6.17 appt moved to 6.10 due to MD on call.....mailed pt appt sched and letter

## 2014-03-08 ENCOUNTER — Ambulatory Visit: Payer: Self-pay | Admitting: Certified Nurse Midwife

## 2014-03-18 ENCOUNTER — Encounter: Payer: Self-pay | Admitting: Certified Nurse Midwife

## 2014-03-18 ENCOUNTER — Ambulatory Visit (INDEPENDENT_AMBULATORY_CARE_PROVIDER_SITE_OTHER): Payer: 59 | Admitting: Certified Nurse Midwife

## 2014-03-18 VITALS — BP 118/72 | HR 70 | Resp 16 | Ht 67.25 in | Wt 152.0 lb

## 2014-03-18 DIAGNOSIS — Z0000 Encounter for general adult medical examination without abnormal findings: Secondary | ICD-10-CM | POA: Diagnosis not present

## 2014-03-18 DIAGNOSIS — Z124 Encounter for screening for malignant neoplasm of cervix: Secondary | ICD-10-CM | POA: Diagnosis not present

## 2014-03-18 DIAGNOSIS — N950 Postmenopausal bleeding: Secondary | ICD-10-CM

## 2014-03-18 DIAGNOSIS — Z01419 Encounter for gynecological examination (general) (routine) without abnormal findings: Secondary | ICD-10-CM | POA: Diagnosis not present

## 2014-03-18 DIAGNOSIS — Z1211 Encounter for screening for malignant neoplasm of colon: Secondary | ICD-10-CM

## 2014-03-18 DIAGNOSIS — N912 Amenorrhea, unspecified: Secondary | ICD-10-CM

## 2014-03-18 LAB — POCT URINALYSIS DIPSTICK
Bilirubin, UA: NEGATIVE
Blood, UA: NEGATIVE
Glucose, UA: NEGATIVE
Ketones, UA: NEGATIVE
Leukocytes, UA: NEGATIVE
Nitrite, UA: NEGATIVE
Protein, UA: NEGATIVE
Urobilinogen, UA: NEGATIVE
pH, UA: 5

## 2014-03-18 LAB — POCT URINE PREGNANCY: Preg Test, Ur: NEGATIVE

## 2014-03-18 NOTE — Patient Instructions (Signed)

## 2014-03-18 NOTE — Progress Notes (Signed)
54 y.o. G58P2002 Divorced  Caucasian Fe here for annual exam.  Menopausal with scant amount for 3 days. Some light spotting  Only with wiping with sexual activity, some vaginal dryness. No STD concerns or screening needed. Has appointment with PCP for aex and labs and medication mangement end of month. Recent PTSD attack and plans to schedule therapist. ITP stable on medication. Needs to schedule mammogram. Happy with new jobs!  Patient's last menstrual period was 01/08/2014.          Sexually active: Yes.    The current method of family planning is none.    Exercising: Yes.    walking,physical work Smoker:  no  Health Maintenance: Pap:  02-01-12 neg HPV HR neg MMG:  02-08-13 category b density,birads 2:neg patient scheduling today.  Colonoscopy: IFOB dispensed today BMD:   none TDaP:  2015 Labs: Poct urine-neg, UPT-neg Self breast exam: done monthly   reports that she quit smoking about 16 months ago. She has never used smokeless tobacco. She reports that she drinks about 2.4 oz of alcohol per week. She reports that she does not use illicit drugs.  Past Medical History  Diagnosis Date  . Depression with anxiety 11/16/2010  . Bronchiolitis   . Abnormal uterine bleeding   . Depression   . ITP (idiopathic thrombocytopenic purpura)   . Alcohol abuse, in remission   . Fibroid 1992  . STD (sexually transmitted disease)     positive HPV  . Cancer 07/2007    breast  . Cellulitis     Past Surgical History  Procedure Laterality Date  . Bone marrow biopsy  09/2000    back hip  . Breast surgery  01/2000     left breast mass/ benign  . Endometrial biopsy    . Ear bone surgery      Current Outpatient Prescriptions  Medication Sig Dispense Refill  . buPROPion (WELLBUTRIN) 100 MG tablet Take 100 mg by mouth daily.     . Cholecalciferol 5000 UNITS TABS Take 1 tablet by mouth daily.    . citalopram (CELEXA) 40 MG tablet Take by mouth Daily.     . Omega 3 1200 MG CAPS Take 1 capsule by  mouth daily.    Marland Kitchen PROMACTA 50 MG tablet Take 1 tablet by mouth  daily 30 tablet 5   No current facility-administered medications for this visit.    Family History  Problem Relation Age of Onset  . Hypertension Mother   . Thyroid disease Mother   . Irregular heart beat Father   . Breast cancer Maternal Aunt   . Tuberculosis Maternal Grandmother   . Diabetes Paternal Grandmother   . Cancer Maternal Grandfather     lung    ROS:  Pertinent items are noted in HPI.  Otherwise, a comprehensive ROS was negative.  Exam:   BP 118/72 mmHg  Pulse 70  Resp 16  Ht 5' 7.25" (1.708 m)  Wt 152 lb (68.947 kg)  BMI 23.63 kg/m2  LMP 01/08/2014 Height: 5' 7.25" (170.8 cm) Ht Readings from Last 3 Encounters:  03/18/14 5' 7.25" (1.708 m)  12/20/13 5' 7.25" (1.708 m)  07/16/13 5' 7.25" (1.708 m)    General appearance: alert, cooperative and appears stated age Head: Normocephalic, without obvious abnormality, atraumatic Neck: no adenopathy, supple, symmetrical, trachea midline and thyroid normal to inspection and palpation Lungs: clear to auscultation bilaterally Breasts: normal appearance, no masses or tenderness, No nipple retraction or dimpling, No nipple discharge or bleeding, No axillary  or supraclavicular adenopathy Heart: regular rate and rhythm Abdomen: soft, non-tender; no masses,  no organomegaly Extremities: extremities normal, atraumatic, no cyanosis or edema Skin: Skin color, texture, turgor normal. No rashes or lesions Lymph nodes: Cervical, supraclavicular, and axillary nodes normal. No abnormal inguinal nodes palpated Neurologic: Grossly normal   Pelvic: External genitalia:  no lesions              Urethra:  normal appearing urethra with no masses, tenderness or lesions              Bartholin's and Skene's: normal                 Vagina: normal appearing vagina with normal color and discharge, no lesions              Cervix: normal,non tender, no lesions              Pap  taken: Yes.   Bimanual Exam:  Uterus:  normal size, contour, position, consistency, mobility, non-tender              Adnexa: normal adnexa and no mass, fullness, tenderness               Rectovaginal: Confirms               Anus:  normal sphincter tone, no lesions  Chaperone present: Yes  A:  Well Woman with normal exam  Postmenopausal no HRT  Postmenopausal bleeding, has had in past with negative endo biopsy  History of Right breast cancer, not on Tamoxifen now  History of ITP stable now with hematology management  Colonoscopy due IFOB dispensed  P:   Reviewed health and wellness pertinent to exam  Discussed may will need endo biopsy and possible PUS again to evaluate bleeding patient aware. Patient will be called with insurance information and scheduled  Pap smear taken today with HPVHR  Affirm taken to R/O  Vaginal infection will call with results and treat as indicated if positive.  Stressed scheduling mammogram   counseled on breast self exam, mammography screening, STD prevention, HIV risk factors and prevention, adequate intake of calcium and vitamin D, diet and exercise  return annually or prn  An After Visit Summary was printed and given to the patient.

## 2014-03-19 ENCOUNTER — Telehealth: Payer: Self-pay

## 2014-03-19 LAB — WET PREP BY MOLECULAR PROBE
Candida species: NEGATIVE
Gardnerella vaginalis: NEGATIVE
Trichomonas vaginosis: NEGATIVE

## 2014-03-19 NOTE — Telephone Encounter (Signed)
Patient notified of results. See lab 

## 2014-03-19 NOTE — Telephone Encounter (Signed)
lmtcb

## 2014-03-19 NOTE — Progress Notes (Signed)
Pt declined/cancelled hysteroscopic fibroid resection last year.  I think it at least warrants a repeat endometrial biopsy.  I would schedule that with me is possible and then she and I can additional options.  Reviewed personally.  Felipa Emory, MD.

## 2014-03-19 NOTE — Telephone Encounter (Signed)
-----   Message from Regina Eck, CNM sent at 03/19/2014 12:44 PM EDT ----- Notify patient that affirm was negative for yeast,BV or trichomonas. Spoke with Dr. Sabra Heck and she feels another endometrial biopsy is needed. She will be called to schedule this

## 2014-03-20 LAB — IPS PAP TEST WITH HPV

## 2014-03-21 ENCOUNTER — Telehealth: Payer: Self-pay | Admitting: Certified Nurse Midwife

## 2014-03-21 NOTE — Telephone Encounter (Signed)
Left message for patient to call back. Need to go over benefits and schedule EMB.

## 2014-03-25 NOTE — Telephone Encounter (Signed)
Left message for patient to call back  

## 2014-04-02 NOTE — Telephone Encounter (Signed)
Left message for patient to call back  

## 2014-04-05 ENCOUNTER — Other Ambulatory Visit: Payer: Self-pay | Admitting: Hematology and Oncology

## 2014-04-18 NOTE — Telephone Encounter (Signed)
Left message for patient to call back  

## 2014-04-18 NOTE — Telephone Encounter (Signed)
Patient returned call. I advised her of the benefit quote received for EMB.  Patient agreeable. Scheduled procedure. Advised patient of 72 hour cancellation policy and $741 cancellation fee. Patient agreeable.

## 2014-04-19 ENCOUNTER — Telehealth: Payer: Self-pay | Admitting: Obstetrics & Gynecology

## 2014-04-19 DIAGNOSIS — N938 Other specified abnormal uterine and vaginal bleeding: Secondary | ICD-10-CM

## 2014-04-19 NOTE — Telephone Encounter (Signed)
Patient wants to speak with the nurse concerning her upcoming appointment.

## 2014-04-19 NOTE — Telephone Encounter (Signed)
Spoke with patient. Patient states that she is scheduled for EMB with Dr.Miller on 4/22. "I have been having this bleeding for a while and she previously recommended that I have a D&C which I did not want at the time.Now I have insurance where I have to pay a lot out of pocket. I am wondering is this something we could go ahead and do and get a sample at the same time?"  Advised will need to speak with Dr.Miller and return call with further recommendations. Patient is agreeable.

## 2014-04-21 NOTE — Telephone Encounter (Signed)
It has been over a year so I don't think going straight to the OR is the best decision.  Could start with just PUS and try not to do anything else as long as findings are similar to a last year.  If not, would discuss that day before proceeding with biopsy or SHGM.  If findings similar, will then plan surgery--hysteroscopy with fibroid resection--as long as that is what she still desires.

## 2014-04-22 ENCOUNTER — Telehealth: Payer: Self-pay | Admitting: Obstetrics & Gynecology

## 2014-04-22 NOTE — Telephone Encounter (Signed)
Pt returning call

## 2014-04-22 NOTE — Telephone Encounter (Signed)
Call to patient. Advised of benefit quote received for PUS. Patient agreeable.

## 2014-04-22 NOTE — Telephone Encounter (Signed)
Left message for patient to call back. Need to go over benefits for PUS that is scheduled.

## 2014-04-22 NOTE — Telephone Encounter (Signed)
Spoke with patient. Advised of message as seen below from [place]. Patient is agreeable and verbalizes understanding. Patient would like to cancel appointment for EMB at this time and schedule appointment for PUS. PUS scheduled for 05/09/2014 at 1pm with 1:30pm consult with Dr.Miller. Patient is agreeable to date and time. Order placed for PUS for precert.  Routing to provider for final review. Patient agreeable to disposition. Will close encounter

## 2014-04-23 ENCOUNTER — Telehealth: Payer: Self-pay | Admitting: Certified Nurse Midwife

## 2014-04-23 NOTE — Telephone Encounter (Signed)
Spoke with patient. Patient calling in regards to PUS appointment scheduled for 05/09/2014. Patient states "I am flat broke and want to make sure I am doing the most cost effective thing." Advised patient per Dr.Miller can have PUS first and once results have been reviewed will discuss further recommendations which could include SHGM and EMB. Advised proceeding with PUS first will allow Dr.Miller to make recommendations based upon results and that [facility] and EMB may not be needed. Advised this is all based on findings at that appointment. Patient is agreeable. Patient would like to proceed with PUS on 5/5 with Dr.Miller at this time. Will return call with any further questions.  Routing to provider for final review. Patient agreeable to disposition. Will close encounter

## 2014-04-23 NOTE — Telephone Encounter (Signed)
Patient calling with additional questions about her upcoming procedure. She asked for [facility] in triage.

## 2014-04-26 ENCOUNTER — Ambulatory Visit: Payer: 59 | Admitting: Obstetrics & Gynecology

## 2014-04-29 ENCOUNTER — Telehealth: Payer: Self-pay | Admitting: Obstetrics & Gynecology

## 2014-04-29 DIAGNOSIS — N926 Irregular menstruation, unspecified: Secondary | ICD-10-CM

## 2014-04-29 NOTE — Telephone Encounter (Signed)
Spoke with patient. Patient states "I just wanted to let you know that I am spotting every day. I have been for like 2-3 weeks. I am wearing a light panty liner every day. I feel like it is impacting my energy level. It has slowly been going down." Patient is changing panty liner once per day. Has PUS scheduled for 5/5 with Dr.Miller. Patient would like to know "Do I need to move this appointment up?" States fatigue is gradually increases daily. Advised Dr.Miller will be out of the office this Thursday but we do have ultrasound available May 3rd and could move her appointment up. "I just want to do what is best." Advised will speak with Dr.Miller regarding spotting and upcoming appointment and return call. Patient is agreeable.

## 2014-04-29 NOTE — Telephone Encounter (Signed)
Patient states she has been having a long discussion with Verline Lema about an on going problem. She wants to speak with Verline Lema again today.

## 2014-04-29 NOTE — Telephone Encounter (Signed)
She could come in just for a lab draw before then to see how hemoglobin is and to see if needs testing before I return.  Could do CBC tomorrow if possible for pt.  If not anemic, it is ok to wait.

## 2014-04-30 ENCOUNTER — Other Ambulatory Visit (INDEPENDENT_AMBULATORY_CARE_PROVIDER_SITE_OTHER): Payer: 59

## 2014-04-30 DIAGNOSIS — N926 Irregular menstruation, unspecified: Secondary | ICD-10-CM

## 2014-04-30 LAB — CBC WITH DIFFERENTIAL/PLATELET
Basophils Absolute: 0.2 10*3/uL — ABNORMAL HIGH (ref 0.0–0.1)
Basophils Relative: 3 % — ABNORMAL HIGH (ref 0–1)
Eosinophils Absolute: 0.2 10*3/uL (ref 0.0–0.7)
Eosinophils Relative: 3 % (ref 0–5)
HCT: 39.1 % (ref 36.0–46.0)
Hemoglobin: 13.3 g/dL (ref 12.0–15.0)
Lymphocytes Relative: 34 % (ref 12–46)
Lymphs Abs: 2.1 10*3/uL (ref 0.7–4.0)
MCH: 29.6 pg (ref 26.0–34.0)
MCHC: 34 g/dL (ref 30.0–36.0)
MCV: 86.9 fL (ref 78.0–100.0)
MPV: 11.3 fL (ref 8.6–12.4)
Monocytes Absolute: 0.7 10*3/uL (ref 0.1–1.0)
Monocytes Relative: 11 % (ref 3–12)
Neutro Abs: 3 10*3/uL (ref 1.7–7.7)
Neutrophils Relative %: 49 % (ref 43–77)
Platelets: 244 10*3/uL (ref 150–400)
RBC: 4.5 MIL/uL (ref 3.87–5.11)
RDW: 15.5 % (ref 11.5–15.5)
WBC: 6.2 10*3/uL (ref 4.0–10.5)

## 2014-04-30 NOTE — Telephone Encounter (Signed)
Spoke with patient. Advised of message as seen below from [place]. Patient is agreeable. Lab appointment scheduled for today at 11am. CBC order placed as stat so we can get results back before Dr.Miller is out.

## 2014-05-01 ENCOUNTER — Telehealth: Payer: Self-pay

## 2014-05-01 NOTE — Telephone Encounter (Signed)
lmtcb/kn 

## 2014-05-01 NOTE — Telephone Encounter (Signed)
-----   Message from Megan Salon, MD sent at 04/30/2014  5:07 PM EDT ----- Inform pt hb is normal.  No anemia.  Ok to wait until week.

## 2014-05-06 LAB — FECAL OCCULT BLOOD, IMMUNOCHEMICAL: IFOBT: NEGATIVE

## 2014-05-06 NOTE — Addendum Note (Signed)
Addended by: Susy Manor on: 05/06/2014 01:07 PM   Modules accepted: Orders, SmartSet

## 2014-05-07 NOTE — Telephone Encounter (Signed)
Patient notified of results.//kn 

## 2014-05-09 ENCOUNTER — Ambulatory Visit (INDEPENDENT_AMBULATORY_CARE_PROVIDER_SITE_OTHER): Payer: 59

## 2014-05-09 ENCOUNTER — Ambulatory Visit (INDEPENDENT_AMBULATORY_CARE_PROVIDER_SITE_OTHER): Payer: 59 | Admitting: Obstetrics & Gynecology

## 2014-05-09 VITALS — BP 102/80 | HR 88 | Resp 16 | Ht 67.25 in | Wt 151.0 lb

## 2014-05-09 DIAGNOSIS — D251 Intramural leiomyoma of uterus: Secondary | ICD-10-CM

## 2014-05-09 DIAGNOSIS — D250 Submucous leiomyoma of uterus: Secondary | ICD-10-CM

## 2014-05-09 DIAGNOSIS — N950 Postmenopausal bleeding: Secondary | ICD-10-CM

## 2014-05-09 DIAGNOSIS — D252 Subserosal leiomyoma of uterus: Secondary | ICD-10-CM

## 2014-05-09 DIAGNOSIS — N938 Other specified abnormal uterine and vaginal bleeding: Secondary | ICD-10-CM | POA: Diagnosis not present

## 2014-05-09 NOTE — Progress Notes (Signed)
54 y.o. G2P2 Divorcedfemale here for a pelvic ultrasound due to continues PMP bleeding, sometimes after sexual activity.  H/O ITP but recent labs good.  Platelets were 244 04/30/14.  Pt has been considering recommended surgery from last year.  Reports she never restarted her Tamoxifen due to recommendations from Dr. Alvy Bimler.  Knows bleeding typical.  Ready to proceed if necessary.  No LMP recorded.  Sexually active:  yes  Contraception: no method  FINDINGS: UTERUS: 9.4 x 5.5 x 4.7cm with multiple small intramural and subserosal fibroids--1.3cm, 2.1cm, 1.5cm, 1.0cm.  Also 6-73mm submucosal fibroid present EMS: 53mm ADNEXA:   Left ovary 2.0 x 0.9 x 1.1cm   Right ovary 3.3 x 0.8 x 1.4cm CUL DE SAC: no free fluid  Reviewed images with pt.  Pt has been having intermittent PMP bleeding.  Last year, had similar issue.  Was on tamoxifen then as well.  Off now.  Endometrial biopsy 2.16.15 with secretory endometrium.  [place] 2.25.16 was 23.  Pt reports feeling more "hormonal" this year.  No hot flashes, really.  Aware last year's [place] might have been artificially low due to being on Tamoxifen.  Now, [place] will be accurate measure of hormonal status.  Pt willing to proceed with hysteroscopic fibroid resection if [facility] is elevated.  Will test today.  Assessment:  PMP bleeding, Uterine fibroids, H/O breast cancer s/p Tamoxifen use--now off  Plan: [facility] today.  If elevated, will proceed with hysteroscopic fibroid resection as discussed last year.  Procedure, risks and benefits reviewed.  Hospital stay and recovery reviewed.  ~20 minutes spent with patient >50% of time was in face to face discussion of above.

## 2014-05-10 LAB — FOLLICLE STIMULATING HORMONE: FSH: 99.4 m[IU]/mL

## 2014-05-12 ENCOUNTER — Encounter: Payer: Self-pay | Admitting: Obstetrics & Gynecology

## 2014-05-12 DIAGNOSIS — D251 Intramural leiomyoma of uterus: Secondary | ICD-10-CM | POA: Insufficient documentation

## 2014-05-12 DIAGNOSIS — N950 Postmenopausal bleeding: Secondary | ICD-10-CM | POA: Insufficient documentation

## 2014-05-12 DIAGNOSIS — D252 Subserosal leiomyoma of uterus: Secondary | ICD-10-CM | POA: Insufficient documentation

## 2014-05-20 ENCOUNTER — Telehealth: Payer: Self-pay | Admitting: *Deleted

## 2014-05-20 NOTE — Telephone Encounter (Signed)
-----   Message from Megan Salon, MD sent at 05/12/2014  1:43 PM EDT ----- Inform pt [place] is elevated, indicating clear menopause. Should plan to proceed with hysteroscopic resection of submucosal fibroid.  Thanks.

## 2014-05-20 NOTE — Telephone Encounter (Signed)
Call to patient to review results. Left message to call back.  

## 2014-05-22 NOTE — Telephone Encounter (Signed)
Returning a call to Yvonne Hoover. °

## 2014-05-23 NOTE — Telephone Encounter (Signed)
Call to patient. Notified of results as directed by Dr Sabra Heck. Discussed recommendation to proceed with Hysteroscopic resection of fibroid. Brief description of scheduling policies and date options.  Patient requests to call back to discuss this as she is with a customer.  She is agreeable to proceed with precertification and will call back later to finalize details.

## 2014-06-05 ENCOUNTER — Telehealth: Payer: Self-pay | Admitting: Obstetrics & Gynecology

## 2014-06-05 NOTE — Telephone Encounter (Signed)
Patient states that she has questions about procedure that was recommended for her. Patient wants a nurse to call her back just to ask some questions about procedure pro and cons.

## 2014-06-05 NOTE — Telephone Encounter (Signed)
Spoke with patient. Patient states when she was speaking with Gay Filler regarding her results and possible procedure she was helping a friend move and is not clear on what was said. Advised of message as seen below from [place]. Patient states that she was seen with her PCP Dr.Hux and he advised her not to have a hysteroscopic resection of her fibroid. "He told me it would not stop the bleeding and I should have an endometrial ablation instead. I have been seeing him for a long time and his recommendations really mean a lot to me. I was just wanting to see what Dr.Miller thought about this instead. He really does not recommend the other procedure." Advised I will send Dr.Miller a message and return call with further recommendations. Patient is agreeable.  Notes Recorded by Megan Salon, MD on 05/12/2014 at 1:43 PM Inform pt FSH is elevated, indicating clear menopause. Should plan to proceed with hysteroscopic resection of submucosal fibroid. Thanks.

## 2014-06-06 NOTE — Telephone Encounter (Signed)
Left message to call Kaitlyn at [phone]. 

## 2014-06-06 NOTE — Telephone Encounter (Signed)
She is 54 yo and has taken Tamoxifen.  I do not think an ablation is appropriate.  If I had thought it was an option, I would have discussed this with her.  I am happy to have her see someone else if she's like a second opinion, however.

## 2014-06-07 ENCOUNTER — Telehealth: Payer: Self-pay | Admitting: Certified Nurse Midwife

## 2014-06-07 NOTE — Telephone Encounter (Addendum)
Spoke with patient. Patient states that she is unclear about recommendations for surgery. "I have received conflicting views from my primary and Dr.Miller and just really want to talk to Debbi. I like Dr.Miller but sometimes I just want to speak with Debbi about things to get her opinion." Patient aware Regina Eck CNM does not perform surgery. Would like to just discuss everything. Appointment scheduled for 6/7 at 10am with Regina Eck CNM. Patient is agreeable to date and time.  Routing to provider for final review. Patient agreeable to disposition. Will close encounter.   Cc: Dr.Miller  Patient aware provider will review message and nurse will return call if any additional advice or change of disposition.

## 2014-06-07 NOTE — Telephone Encounter (Signed)
Returned call

## 2014-06-07 NOTE — Telephone Encounter (Signed)
See next phone encounter. Patient has multiple questions and concerns and is not ready to proceed with scheduling. Has appointment scheduled to discuss concerns with French Ana CNM on 06-11-14.  Will close this encounter and wait for further instruction following next appointment.  Routing to provider for final review. Patient agreeable to disposition. Will close encounter.

## 2014-06-07 NOTE — Telephone Encounter (Signed)
Patient called back and stated, "My problem I called in about is a lie. I am not having a rash. I really just want to have a consultation with Debbi to discuss my concerns about a procedure that was recommended."

## 2014-06-07 NOTE — Telephone Encounter (Signed)
Spoke with patient. Advised of message as seen below from [place]. Patient is agreeable and verbalizes understanding. Patient states she would like to think about her options and how she would like to move forward and return call.  Routing to provider for final review. Patient agreeable to disposition. Will close encounter.

## 2014-06-07 NOTE — Telephone Encounter (Signed)
Pt states she has an unidentifiable rash located in her vagina. Pt is agreeable to a callback from triage.

## 2014-06-11 ENCOUNTER — Ambulatory Visit (INDEPENDENT_AMBULATORY_CARE_PROVIDER_SITE_OTHER): Payer: 59 | Admitting: Certified Nurse Midwife

## 2014-06-11 ENCOUNTER — Encounter: Payer: Self-pay | Admitting: Certified Nurse Midwife

## 2014-06-11 VITALS — BP 138/88 | HR 68 | Ht 67.0 in | Wt 148.4 lb

## 2014-06-11 DIAGNOSIS — N950 Postmenopausal bleeding: Secondary | ICD-10-CM

## 2014-06-11 NOTE — Progress Notes (Signed)
54 y.o. Divorced Caucasian female G2P2002 here for discussion of continued PMB as result of fibroids noted on PUS. Patient has had breast cancer and continues on Tamoxifen per oncology. She has had PUS to confirm fibroids and benign endometrial biopsy. Bleeding has increased in the past few weeks to from spotting to daily light bleeding. She was seen by her PCP who she has had long term relationship with and recommended she have ablation or hysterectomy. Patient had work up here with Dr Sabra Heck and fibroid resection was recommended. Patient has been long term relationship with myself from birth of her son and would like help in understanding what is best. ITP chronic problem, but actually is doing well with at present. She is worried with bleeding increase that this could be a problem also. No other concerns.   O: Healthy WD,WN female Affect: normal, orientation x 3  A: PMB on tamoxifen from history of breast cancer Menopausal Uterine fibroids ITP good control at present  P: Discussed with patient that with her history of breast cancer not advisable to freeze tissue that may be unknown and with fibroids this is not recommended. Questions addressed at length with review she had done regarding this. She agrees this is not a good choice. She also does want a hysterectomy, but would like bleeding under control. Discussed she was evaluated and had questions addressed appropriately with Dr.Miller and she agreed this was very helpful. "I just want to do the right thing". Discussed with patient that tissue removed would be sent to pathology and she will know if there are any concerns with the fibroids and also with surgery the MD has the advantage of seeing the tissue and area of concern. Patient feels she is ready to make a decision and wants to move forward with surgery of fibroid resection. Feels comfortable now after discussion. Discussed with patient I will let Dr. Sabra Heck she is ready to schedule and her  insurance will be pre certed and she will be given information and called to set up her surgery.  Patient voiced relief in knowing she is moving forward with this. Will be aware of surgery date and keep her in my thoughts for successful surgery.  Rv prn  Time spent with patient in face to face discussion regarding PMB management 35 minutes.

## 2014-06-11 NOTE — Progress Notes (Signed)
Reviewed personally.  M. Suzanne Amman Bartel, MD.  

## 2014-06-14 ENCOUNTER — Telehealth: Payer: Self-pay | Admitting: Hematology and Oncology

## 2014-06-14 ENCOUNTER — Other Ambulatory Visit (HOSPITAL_BASED_OUTPATIENT_CLINIC_OR_DEPARTMENT_OTHER): Payer: 59

## 2014-06-14 ENCOUNTER — Encounter: Payer: Self-pay | Admitting: Hematology and Oncology

## 2014-06-14 ENCOUNTER — Telehealth: Payer: Self-pay | Admitting: Obstetrics & Gynecology

## 2014-06-14 ENCOUNTER — Ambulatory Visit (HOSPITAL_BASED_OUTPATIENT_CLINIC_OR_DEPARTMENT_OTHER): Payer: 59 | Admitting: Hematology and Oncology

## 2014-06-14 VITALS — BP 142/72 | HR 57 | Temp 98.0°F | Resp 18 | Ht 67.0 in | Wt 148.9 lb

## 2014-06-14 DIAGNOSIS — Z853 Personal history of malignant neoplasm of breast: Secondary | ICD-10-CM

## 2014-06-14 DIAGNOSIS — C50911 Malignant neoplasm of unspecified site of right female breast: Secondary | ICD-10-CM

## 2014-06-14 DIAGNOSIS — D693 Immune thrombocytopenic purpura: Secondary | ICD-10-CM | POA: Diagnosis not present

## 2014-06-14 DIAGNOSIS — N924 Excessive bleeding in the premenopausal period: Secondary | ICD-10-CM

## 2014-06-14 LAB — CBC WITH DIFFERENTIAL/PLATELET
BASO%: 1.4 % (ref 0.0–2.0)
Basophils Absolute: 0.1 10*3/uL (ref 0.0–0.1)
EOS%: 2.6 % (ref 0.0–7.0)
Eosinophils Absolute: 0.2 10*3/uL (ref 0.0–0.5)
HCT: 38.5 % (ref 34.8–46.6)
HGB: 12.8 g/dL (ref 11.6–15.9)
LYMPH%: 33.7 % (ref 14.0–49.7)
MCH: 29.4 pg (ref 25.1–34.0)
MCHC: 33.2 g/dL (ref 31.5–36.0)
MCV: 88.3 fL (ref 79.5–101.0)
MONO#: 0.7 10*3/uL (ref 0.1–0.9)
MONO%: 9.8 % (ref 0.0–14.0)
NEUT#: 3.7 10*3/uL (ref 1.5–6.5)
NEUT%: 52.5 % (ref 38.4–76.8)
Platelets: 105 10*3/uL — ABNORMAL LOW (ref 145–400)
RBC: 4.36 10*6/uL (ref 3.70–5.45)
RDW: 15.1 % — ABNORMAL HIGH (ref 11.2–14.5)
WBC: 7 10*3/uL (ref 3.9–10.3)
lymph#: 2.4 10*3/uL (ref 0.9–3.3)

## 2014-06-14 NOTE — Progress Notes (Signed)
[place] OFFICE PROGRESS NOTE  Patient Care Team: Lysle Pearl, MD as PCP - General (Family Medicine)  SUMMARY OF ONCOLOGIC HISTORY:  I reviewed the patient's records extensive and collaborated the history with the patient. 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 She was put on N-plate given between May 2009 and November 2012 and had a excellent response. She was changed to [facility] in September 2012. She has also achieved a sustained response with platelet count consistently over 100,000 on this medication.   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 She was treated with lumpectomy. Tumor was close to the nipple areolar complex which was removed with the surgery. She received postop radiation. She was put on tamoxifen hormonal therapy which she continues at this time. She had an endometrial biopsy 10/29/2012 for dysfunctional uterine bleeding. No signs of malignancy. Tamoxifen resumed and then discontinued June 2015 as she has completed 5 years adjuvant treatment.  INTERVAL HISTORY: Please see below for problem oriented charting. She is stressed because of intermittent vaginal bleeding and is still undergoing evaluation and treatment. The patient denies any recent signs or symptoms of bleeding such as spontaneous epistaxis, hematuria or hematochezia. She denies any recent abnormal breast examination, palpable mass, abnormal breast appearance or nipple changes   REVIEW OF SYSTEMS:   Constitutional: Denies fevers, chills or abnormal weight loss Eyes: Denies blurriness of vision Ears, nose, mouth, throat, and face: Denies mucositis or sore throat Respiratory: Denies cough, dyspnea or wheezes Cardiovascular: Denies palpitation,  chest discomfort or lower extremity swelling Gastrointestinal:  Denies nausea, heartburn or change in bowel habits Skin: Denies abnormal skin rashes Lymphatics: Denies new lymphadenopathy or easy bruising Neurological:Denies numbness, tingling or new weaknesses Behavioral/Psych: Mood is stable, no new changes  All other systems were reviewed with the patient and are negative.  I have reviewed the past medical history, past surgical history, social history and family history with the patient and they are unchanged from previous note.  ALLERGIES:  has No Known Allergies.  MEDICATIONS:  Current Outpatient Prescriptions  Medication Sig Dispense Refill  . buPROPion (WELLBUTRIN XL) 300 MG 24 hr tablet Take 300 mg by mouth.    . Cholecalciferol 5000 UNITS TABS Take 1 tablet by mouth daily.    . citalopram (CELEXA) 40 MG tablet Take by mouth Daily.     . Omega 3 1200 MG CAPS Take 1 capsule by mouth daily.    Marland Kitchen PROMACTA 50 MG tablet Take 1 tablet by mouth  daily 30 tablet 6   No current facility-administered medications for this visit.    PHYSICAL EXAMINATION: ECOG PERFORMANCE STATUS: 0 - Asymptomatic  Filed Vitals:   06/14/14 1435  BP: 142/72  Pulse: 57  Temp: 98 F (36.7 C)  Resp: 18   Filed Weights   06/14/14 1435  Weight: 148 lb 14.4 oz (67.541 kg)    GENERAL:alert, no distress and comfortable SKIN: skin color, texture, turgor are normal, no rashes or significant lesions EYES: normal, Conjunctiva are pink and non-injected, sclera clear OROPHARYNX:no exudate, no erythema and lips, buccal mucosa, and tongue normal  NECK: supple, thyroid normal size, non-tender, without nodularity LYMPH:  no palpable lymphadenopathy in the cervical, axillary or inguinal LUNGS: clear to auscultation and percussion with normal breathing effort HEART:  regular rate & rhythm and no murmurs and no lower extremity edema ABDOMEN:abdomen soft, non-tender and normal bowel sounds Musculoskeletal:no  cyanosis of digits and no clubbing  NEURO: alert & oriented x 3 with fluent speech, no focal motor/sensory deficits Normal breast examination on the left with Fibrocystic Changes. On the Right Breast, Well-Healed Surgical Scar, Again with Fibrocystic Changes. No Other Abnormalities. LABORATORY DATA:  I have reviewed the data as listed    Component Value Date/Time   NA 141 12/20/2013 1004   NA 138 03/10/2013 1357   K 4.1 12/20/2013 1004   K 4.2 03/10/2013 1357   CL 101 03/10/2013 1357   CL 107 02/16/2012 1436   CO2 28 12/20/2013 1004   CO2 26 03/10/2013 1357   GLUCOSE 60* 12/20/2013 1004   GLUCOSE 115* 03/10/2013 1357   GLUCOSE 83 02/16/2012 1436   BUN 6.9* 12/20/2013 1004   BUN 8 03/10/2013 1357   CREATININE 0.7 12/20/2013 1004   CREATININE 0.64 03/10/2013 1357   CALCIUM 9.3 12/20/2013 1004   CALCIUM 9.2 03/10/2013 1357   PROT 6.7 12/20/2013 1004   PROT 6.4 08/09/2011 1101   ALBUMIN 3.5 12/20/2013 1004   ALBUMIN 3.9 08/09/2011 1101   AST 19 12/20/2013 1004   AST 25 08/09/2011 1101   ALT 12 12/20/2013 1004   ALT 16 08/09/2011 1101   ALKPHOS 73 12/20/2013 1004   ALKPHOS 44 08/09/2011 1101   BILITOT 0.39 12/20/2013 1004   BILITOT 0.6 08/09/2011 1101   GFRNONAA >90 03/10/2013 1357   GFRAA >90 03/10/2013 1357    No results found for: SPEP, UPEP  Lab Results  Component Value Date   WBC 7.0 06/14/2014   NEUTROABS 3.7 06/14/2014   HGB 12.8 06/14/2014   HCT 38.5 06/14/2014   MCV 88.3 06/14/2014   PLT 105* 06/14/2014      Chemistry      Component Value Date/Time   NA 141 12/20/2013 1004   NA 138 03/10/2013 1357   K 4.1 12/20/2013 1004   K 4.2 03/10/2013 1357   CL 101 03/10/2013 1357   CL 107 02/16/2012 1436   CO2 28 12/20/2013 1004   CO2 26 03/10/2013 1357   BUN 6.9* 12/20/2013 1004   BUN 8 03/10/2013 1357   CREATININE 0.7 12/20/2013 1004   CREATININE 0.64 03/10/2013 1357      Component Value Date/Time   CALCIUM 9.3 12/20/2013 1004   CALCIUM 9.2 03/10/2013  1357   ALKPHOS 73 12/20/2013 1004   ALKPHOS 44 08/09/2011 1101   AST 19 12/20/2013 1004   AST 25 08/09/2011 1101   ALT 12 12/20/2013 1004   ALT 16 08/09/2011 1101   BILITOT 0.39 12/20/2013 1004   BILITOT 0.6 08/09/2011 1101      ASSESSMENT & PLAN:  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 She is doing very well with current treatment with Promacta. I recommend continuing the same without dose adjustment. Due to stability of her platelet counts, I recommend followup in 6 months with history, physical examination and blood work   Breast cancer, right breast The patient has completed more than 5 years of adjuvant endocrine therapy. I recommend followup with history, physical examination and yearly mammogram. Her examination is benign with no signs of recurrence.    Abnormal perimenopausal bleeding This could be triggered by tamoxifen. All her recent biopsies were benign. I will defer to her gynecologist for management. From the thrombocytopenia standpoint, even though her platelet count is a little low  today, I see no contraindication for her to proceed with surgical intervention.     Orders Placed This Encounter  Procedures  . CBC with Differential/Platelet    Standing Status: Future     Number of Occurrences:      Standing Expiration Date: 07/19/2015   All questions were answered. The patient knows to call the clinic with any problems, questions or concerns. No barriers to learning was detected. I spent 15 minutes counseling the patient face to face. The total time spent in the appointment was 20 minutes and more than 50% was on counseling and review of test results     [facility], [place], MD 06/14/2014 3:45 PM

## 2014-06-14 NOTE — Assessment & Plan Note (Signed)
The patient has completed more than 5 years of adjuvant endocrine therapy. I recommend followup with history, physical examination and yearly mammogram. Her examination is benign with no signs of recurrence.

## 2014-06-14 NOTE — Assessment & Plan Note (Signed)
This could be triggered by tamoxifen. All her recent biopsies were benign. I will defer to her gynecologist for management. From the thrombocytopenia standpoint, even though her platelet count is a little low today, I see no contraindication for her to proceed with surgical intervention.

## 2014-06-14 NOTE — Assessment & Plan Note (Signed)
She has failed splenectomy, prednisone, and rituximab. She is doing very well with current treatment with Promacta. I recommend continuing the same without dose adjustment. Due to stability of her platelet counts, I recommend followup in 6 months with history, physical examination and blood work

## 2014-06-14 NOTE — Telephone Encounter (Signed)
Patient's oncologist, Dr. Alvy Bimler, recommended a complete hysterectomy, per the patient. Yvonne Hoover has some questions for the nurse about this recommendation.

## 2014-06-14 NOTE — Telephone Encounter (Signed)
Pt confirmed labs/ov per 06/10 POF, gave pt AVS and Calendar.... KJ °

## 2014-06-17 NOTE — Telephone Encounter (Signed)
Appointment is scheduled for 07-01-14 with Dr Sabra Heck.

## 2014-06-17 NOTE — Telephone Encounter (Signed)
Spoke with patient. She states she had a follow up appointment with oncologist Dr. Alvy Bimler and this provider recommended that she have hysterectomy.  Reviewed prior telephone conversations and office visits. Advised patient to come in and have consult with Dr. Sabra Heck to discuss her concerns and the recommendations she is receiving from other physicians. Patient agreeable to this. Patient asked if this appointment could be pre op appointment as well, advised likely no, pre-op appointment would be scheduled closer to surgical scheduling time but that can be discussed with Dr. Sabra Heck at time of appointment. Patient agreeable.  Routing to provider for final review. Patient agreeable to disposition. Advised patient that Dr. Sabra Heck will review message and if additional instructions from Dr. Sabra Heck, will return her call. Patient agreeable.    cc Lamont Snowball, RN

## 2014-06-20 ENCOUNTER — Telehealth: Payer: Self-pay | Admitting: *Deleted

## 2014-06-20 NOTE — Telephone Encounter (Signed)
Voicemail from patient asking if her "RDW being slightly elevated and the low platelet count are related to her vaginal bleeding?" Message left on voicemail identified as Yvonne Hoover that RDW measures blood cell size.  RDW is not related to the vaginal bleeding she's experienced.  Labs will be checked again in six months to monitor these.

## 2014-06-21 ENCOUNTER — Ambulatory Visit: Payer: 59 | Admitting: Hematology and Oncology

## 2014-06-21 ENCOUNTER — Other Ambulatory Visit: Payer: 59

## 2014-07-01 ENCOUNTER — Ambulatory Visit: Payer: 59 | Admitting: Obstetrics & Gynecology

## 2014-07-01 ENCOUNTER — Telehealth: Payer: Self-pay | Admitting: Obstetrics & Gynecology

## 2014-07-01 NOTE — Telephone Encounter (Signed)
Patient's appointment for "surgical options consult" has been canceled today due to [place] being delayed in surgery. Patient needs to reschedule.

## 2014-07-01 NOTE — Telephone Encounter (Signed)
Left message to call Nikitha Mode at [phone]. 

## 2014-07-04 NOTE — Telephone Encounter (Signed)
Call back to patient to assist with rescheduling appointment with Dr Sabra Heck.Left message to call back to triage.

## 2014-07-10 NOTE — Telephone Encounter (Signed)
Spoke with patient. Consult appointment with Dr.Miller rescheduled to 07/19/2014 at 3pm. Patient is agreeable and verbalizes understanding.  Routing to provider for final review. Patient agreeable to disposition. Will close encounter.

## 2014-07-19 ENCOUNTER — Ambulatory Visit (INDEPENDENT_AMBULATORY_CARE_PROVIDER_SITE_OTHER): Payer: 59 | Admitting: Obstetrics & Gynecology

## 2014-07-19 VITALS — BP 128/82 | HR 60 | Resp 16 | Wt 151.0 lb

## 2014-07-19 DIAGNOSIS — N950 Postmenopausal bleeding: Secondary | ICD-10-CM

## 2014-07-19 DIAGNOSIS — D250 Submucous leiomyoma of uterus: Secondary | ICD-10-CM

## 2014-07-19 NOTE — Progress Notes (Signed)
Patient ID: Yvonne Hoover, female   DOB: February 19, 1960, 54 y.o.   MRN: [id number]  54 y.o. [id number] Divorced Caucasian Fe here for discussion of PMP bleeding.  Pt has undergone several biopsies due to PMP bleeding, specifically while on HRT.  These were in 2013, 2014, and 2015.  Ultrasound shows uterine fibroids but specifically a submucosal fibroid that I feel is contributing to bleeding.  Pt took Tamoxifen for 5 years and is now off.  Does not plan to restart.    Pt continues to be apprehensive about surgery.  Has discussed with her PCP her issues and was recommended to consider an endometrial ablation.  Advised pt this not the correct procedure given her situation due to the probability of endometrial scarring and inability to obtain and endometrial biopsy in the future if has additional bleeding.  Also, due to Tamoxifen use and age, I would highly discourage this.  She voices clear understanding.    She has also considered a hysterectomy (after oncologist advised her to consider) and wants to discus today.  Procedure, risks, benefits, recovery all discussed.  She does do heavy activity so she would most likely need to consider 6 weeks recovery from work.  Pt really does not want any surgery.  In the end, she states she just wants to keep as many body parts as possible and really doesn't want surgery.  As long as she is willing to continue with endometrial biopsies, to ensure no cancer present, then this is ultimately her decision to make.  Pt has not had any bleeding in almost three months.  Advised she needs to call if she does have any in the future.  Voices clear understanding.    Assessment:  PMP bleeding, h/o uterine fibroids, submucosal fibroid noted with ultrasound, most recently 05/09/14, personal hx of breast cancer off tamoxifen after 5 years of usage, ITP  Plan:  Pt desires to proceed with watchful waiting.  She will call with any future bleeding.  Has not had a biopsy since 2/15, so if has  bleeding again, I would recommend repeat biopsy.  Pt in agreement with plan.  ~20 minutes spent with patient >50% of time was in face to face discussion of above.

## 2014-07-20 ENCOUNTER — Encounter: Payer: Self-pay | Admitting: Obstetrics & Gynecology

## 2014-11-25 ENCOUNTER — Telehealth: Payer: Self-pay | Admitting: *Deleted

## 2014-11-25 MED ORDER — ELTROMBOPAG OLAMINE 50 MG PO TABS: ORAL_TABLET | ORAL | Status: DC

## 2014-11-25 NOTE — Telephone Encounter (Signed)
Pt left message stating she is out of promacta. Needs refill sent to [facility].

## 2014-11-25 NOTE — Telephone Encounter (Signed)
Pt requested refill on Promacta to [facility].  Her next appt is 12/9 for lab/MD.   Promacta 50 mg daily,  30 day supply E-scribed to [facility].

## 2014-11-26 NOTE — Telephone Encounter (Signed)
OK to refill

## 2014-12-05 ENCOUNTER — Telehealth: Payer: Self-pay | Admitting: Nurse Practitioner

## 2014-12-05 NOTE — Telephone Encounter (Signed)
Left message regarding upcoming appointment 12/06/14 for "std testing" has been canceled and needs to be rescheduled.

## 2014-12-06 ENCOUNTER — Ambulatory Visit: Payer: 59 | Admitting: Obstetrics and Gynecology

## 2014-12-06 ENCOUNTER — Ambulatory Visit: Payer: 59 | Admitting: Nurse Practitioner

## 2014-12-10 ENCOUNTER — Ambulatory Visit: Payer: 59 | Admitting: Certified Nurse Midwife

## 2014-12-13 ENCOUNTER — Other Ambulatory Visit: Payer: 59

## 2014-12-13 ENCOUNTER — Ambulatory Visit: Payer: 59 | Admitting: Hematology and Oncology

## 2014-12-24 ENCOUNTER — Other Ambulatory Visit: Payer: Self-pay

## 2014-12-24 NOTE — Telephone Encounter (Signed)
Pt called requesting refill of promacta through [place] [facility]. She is requesting a call back when handled.

## 2014-12-24 NOTE — Telephone Encounter (Signed)
lvm that she needs to call and make appt to see Dr Alvy Bimler before she will refill promacta

## 2014-12-24 NOTE — Telephone Encounter (Signed)
She no show recently No refill until I see her back

## 2014-12-26 ENCOUNTER — Telehealth: Payer: Self-pay | Admitting: *Deleted

## 2014-12-26 ENCOUNTER — Other Ambulatory Visit: Payer: Self-pay | Admitting: Oncology

## 2014-12-26 MED ORDER — ELTROMBOPAG OLAMINE 50 MG PO TABS: ORAL_TABLET | ORAL | Status: DC

## 2014-12-26 NOTE — Telephone Encounter (Signed)
Pt called states very angry that she called 2 days ago for Promacta refill.  Says she has not heard back from [place] and she will "bleed to death" if she does not get it refilled.  Informed pt that Triage Rn did call her back and left her a VM informing she needed to make appt to see Dr. Alvy Bimler before she can get refill.  She missed her last appt.. Pt yelling on phone, states she missed appt because her boyfriend was in the hospital almost "almost died."   She insists Dr. Alvy Bimler be made aware that she will be out of her Promacta and that she take the risk of pt bleeding to death.    Called Dr. Alvy Bimler although she is out of office this afternoon.  She authorized 2 week refill of Promacta, but pt needs to make appt to see Dr. Alvy Bimler next week.   POF sent. Called pt and informed her of 2 week supply sent electronically to [facility].   Scheduler will call with appointment. She verbalized understanding.

## 2014-12-27 ENCOUNTER — Encounter: Payer: Self-pay | Admitting: *Deleted

## 2014-12-27 ENCOUNTER — Telehealth: Payer: Self-pay | Admitting: *Deleted

## 2014-12-27 NOTE — Telephone Encounter (Signed)
Patient called "asking for Dr. Alvy Bimler to call Carley Hammed about the Promacta order.  Because it's different from what it has been they need to talk with someone.  Please call me at [phone] so I'll know this was done.

## 2014-12-27 NOTE — Telephone Encounter (Signed)
Called [facility] to clarify prescription. Was written for 14 days, [facility] unable to break package to dispense # 14, will send # 30 with instructions to take for 14 days. PT notified.

## 2014-12-31 ENCOUNTER — Telehealth: Payer: Self-pay | Admitting: Hematology and Oncology

## 2014-12-31 NOTE — Telephone Encounter (Signed)
Called patient and notified her of appt for 01/02/2015.       [facility].

## 2015-01-02 ENCOUNTER — Other Ambulatory Visit (HOSPITAL_BASED_OUTPATIENT_CLINIC_OR_DEPARTMENT_OTHER): Payer: 59

## 2015-01-02 ENCOUNTER — Ambulatory Visit (HOSPITAL_BASED_OUTPATIENT_CLINIC_OR_DEPARTMENT_OTHER): Payer: 59 | Admitting: Hematology and Oncology

## 2015-01-02 ENCOUNTER — Telehealth: Payer: Self-pay | Admitting: Hematology and Oncology

## 2015-01-02 ENCOUNTER — Encounter: Payer: Self-pay | Admitting: Hematology and Oncology

## 2015-01-02 VITALS — BP 153/75 | HR 67 | Temp 98.2°F | Resp 18 | Ht 67.0 in | Wt 155.2 lb

## 2015-01-02 DIAGNOSIS — D693 Immune thrombocytopenic purpura: Secondary | ICD-10-CM

## 2015-01-02 DIAGNOSIS — Z853 Personal history of malignant neoplasm of breast: Secondary | ICD-10-CM

## 2015-01-02 LAB — CBC WITH DIFFERENTIAL/PLATELET
BASO%: 2.7 % — ABNORMAL HIGH (ref 0.0–2.0)
Basophils Absolute: 0.2 10*3/uL — ABNORMAL HIGH (ref 0.0–0.1)
EOS%: 3.4 % (ref 0.0–7.0)
Eosinophils Absolute: 0.3 10*3/uL (ref 0.0–0.5)
HCT: 43.7 % (ref 34.8–46.6)
HGB: 14.1 g/dL (ref 11.6–15.9)
LYMPH%: 27.2 % (ref 14.0–49.7)
MCH: 29.5 pg (ref 25.1–34.0)
MCHC: 32.3 g/dL (ref 31.5–36.0)
MCV: 91.3 fL (ref 79.5–101.0)
MONO#: 0.9 10*3/uL (ref 0.1–0.9)
MONO%: 11.3 % (ref 0.0–14.0)
NEUT#: 4.2 10*3/uL (ref 1.5–6.5)
NEUT%: 55.4 % (ref 38.4–76.8)
Platelets: 99 10*3/uL — ABNORMAL LOW (ref 145–400)
RBC: 4.78 10*6/uL (ref 3.70–5.45)
RDW: 14.2 % (ref 11.2–14.5)
WBC: 7.7 10*3/uL (ref 3.9–10.3)
lymph#: 2.1 10*3/uL (ref 0.9–3.3)

## 2015-01-02 MED ORDER — ELTROMBOPAG OLAMINE 50 MG PO TABS: ORAL_TABLET | ORAL | Status: DC

## 2015-01-02 NOTE — Telephone Encounter (Signed)
Gave patient avs report and appointments for June.  °

## 2015-01-02 NOTE — Assessment & Plan Note (Signed)
The patient has completed more than 5 years of adjuvant endocrine therapy. Recent mammogram was negative I will examine her in her next visit

## 2015-01-02 NOTE — Progress Notes (Signed)
[place] OFFICE PROGRESS NOTE  Patient Care Team: Lysle Pearl, MD as PCP - General (Family Medicine)  SUMMARY OF ONCOLOGIC HISTORY:  I reviewed the patient's records extensive and collaborated the history with the patient. 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 She was put on N-plate given between May 2009 and November 2012 and had a excellent response. She was changed to [facility] in September 2012. She has also achieved a sustained response with platelet count consistently over 100,000 on this medication.   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 She was treated with lumpectomy. Tumor was close to the nipple areolar complex which was removed with the surgery. She received postop radiation. She was put on tamoxifen hormonal therapy which she continues at this time. She had an endometrial biopsy 10/29/2012 for dysfunctional uterine bleeding. No signs of malignancy. Tamoxifen resumed and then discontinued June 2015 as she has completed 5 years adjuvant treatment.  INTERVAL HISTORY: Please see below for problem oriented charting. The patient denies any recent signs or symptoms of bleeding such as spontaneous epistaxis, hematuria or hematochezia. She denies any recent abnormal breast examination, palpable mass, abnormal breast appearance or nipple changes She missed her appointment recently because of boyfriend was hospitalized for biliary obstruction. She complained of occasional skin itching.  REVIEW OF SYSTEMS:   Constitutional: Denies fevers, chills or abnormal weight loss Eyes: Denies blurriness of vision Ears, nose, mouth, throat, and face: Denies mucositis or sore throat Respiratory: Denies cough, dyspnea or  wheezes Cardiovascular: Denies palpitation, chest discomfort or lower extremity swelling Gastrointestinal:  Denies nausea, heartburn or change in bowel habits Skin: Denies abnormal skin rashes Lymphatics: Denies new lymphadenopathy or easy bruising Neurological:Denies numbness, tingling or new weaknesses Behavioral/Psych: Mood is stable, no new changes  All other systems were reviewed with the patient and are negative.  I have reviewed the past medical history, past surgical history, social history and family history with the patient and they are unchanged from previous note.  ALLERGIES:  has No Known Allergies.  MEDICATIONS:  Current Outpatient Prescriptions  Medication Sig Dispense Refill  . buPROPion (WELLBUTRIN XL) 300 MG 24 hr tablet Take 300 mg by mouth.    . Cholecalciferol 5000 UNITS TABS Take 1 tablet by mouth daily.    . citalopram (CELEXA) 40 MG tablet Take by mouth Daily.     Marland Kitchen eltrombopag (PROMACTA) 50 MG tablet Take 1 tablet by mouth  daily 90 tablet 1  . Omega 3 1200 MG CAPS Take 1 capsule by mouth daily.     No current facility-administered medications for this visit.    PHYSICAL EXAMINATION: ECOG PERFORMANCE STATUS: 0 - Asymptomatic  Filed Vitals:   01/02/15 0842  BP: 153/75  Pulse: 67  Temp: 98.2 F (36.8 C)  Resp: 18   Filed Weights   01/02/15 0842  Weight: 155 lb 3.2 oz (70.398 kg)    GENERAL:alert, no distress and comfortable SKIN: skin color, texture, turgor are normal, no rashes or significant lesions. Noted dry skin. She does not look jaundiced EYES: normal, Conjunctiva are pink and non-injected, sclera clear OROPHARYNX:no exudate, no erythema and lips, buccal mucosa, and tongue normal  NECK: supple, thyroid normal size, non-tender, without nodularity LYMPH:  no palpable lymphadenopathy in the cervical, axillary or  inguinal LUNGS: clear to auscultation and percussion with normal breathing effort HEART: regular rate & rhythm and no murmurs and no  lower extremity edema ABDOMEN:abdomen soft, non-tender and normal bowel sounds Musculoskeletal:no cyanosis of digits and no clubbing  NEURO: alert & oriented x 3 with fluent speech, no focal motor/sensory deficits  LABORATORY DATA:  I have reviewed the data as listed    Component Value Date/Time   NA 141 12/20/2013 1004   NA 138 03/10/2013 1357   K 4.1 12/20/2013 1004   K 4.2 03/10/2013 1357   CL 101 03/10/2013 1357   CL 107 02/16/2012 1436   CO2 28 12/20/2013 1004   CO2 26 03/10/2013 1357   GLUCOSE 60* 12/20/2013 1004   GLUCOSE 115* 03/10/2013 1357   GLUCOSE 83 02/16/2012 1436   BUN 6.9* 12/20/2013 1004   BUN 8 03/10/2013 1357   CREATININE 0.7 12/20/2013 1004   CREATININE 0.64 03/10/2013 1357   CALCIUM 9.3 12/20/2013 1004   CALCIUM 9.2 03/10/2013 1357   PROT 6.7 12/20/2013 1004   PROT 6.4 08/09/2011 1101   ALBUMIN 3.5 12/20/2013 1004   ALBUMIN 3.9 08/09/2011 1101   AST 19 12/20/2013 1004   AST 25 08/09/2011 1101   ALT 12 12/20/2013 1004   ALT 16 08/09/2011 1101   ALKPHOS 73 12/20/2013 1004   ALKPHOS 44 08/09/2011 1101   BILITOT 0.39 12/20/2013 1004   BILITOT 0.6 08/09/2011 1101   GFRNONAA >90 03/10/2013 1357   GFRAA >90 03/10/2013 1357    No results found for: SPEP, UPEP  Lab Results  Component Value Date   WBC 7.7 01/02/2015   NEUTROABS 4.2 01/02/2015   HGB 14.1 01/02/2015   HCT 43.7 01/02/2015   MCV 91.3 01/02/2015   PLT 99* 01/02/2015      Chemistry      Component Value Date/Time   NA 141 12/20/2013 1004   NA 138 03/10/2013 1357   K 4.1 12/20/2013 1004   K 4.2 03/10/2013 1357   CL 101 03/10/2013 1357   CL 107 02/16/2012 1436   CO2 28 12/20/2013 1004   CO2 26 03/10/2013 1357   BUN 6.9* 12/20/2013 1004   BUN 8 03/10/2013 1357   CREATININE 0.7 12/20/2013 1004   CREATININE 0.64 03/10/2013 1357      Component Value Date/Time   CALCIUM 9.3 12/20/2013 1004   CALCIUM 9.2 03/10/2013 1357   ALKPHOS 73 12/20/2013 1004   ALKPHOS 44 08/09/2011 1101    AST 19 12/20/2013 1004   AST 25 08/09/2011 1101   ALT 12 12/20/2013 1004   ALT 16 08/09/2011 1101   BILITOT 0.39 12/20/2013 1004   BILITOT 0.6 08/09/2011 1101       ASSESSMENT & PLAN:  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 She is doing very well with current treatment with Promacta. I recommend continuing the same without dose adjustment. Due to stability of her platelet counts, I recommend followup in 6 months with history, physical examination and blood work     History of breast cancer in female The patient has completed more than 5 years of adjuvant endocrine therapy. Recent mammogram was negative I will examine her in her next visit       Orders Placed This Encounter  Procedures  . CBC with Differential/Platelet    Standing Status: Future     Number of Occurrences:      Standing Expiration Date: 02/06/2016  . Comprehensive metabolic panel    Standing Status: Future  Number of Occurrences:      Standing Expiration Date: 02/06/2016   All questions were answered. The patient knows to call the clinic with any problems, questions or concerns. No barriers to learning was detected. I spent 15 minutes counseling the patient face to face. The total time spent in the appointment was 20 minutes and more than 50% was on counseling and review of test results     [facility], [place], MD 01/02/2015 9:04 AM

## 2015-01-02 NOTE — Assessment & Plan Note (Signed)
She has failed splenectomy, prednisone, and rituximab. She is doing very well with current treatment with Promacta. I recommend continuing the same without dose adjustment. Due to stability of her platelet counts, I recommend followup in 6 months with history, physical examination and blood work  

## 2015-01-08 ENCOUNTER — Other Ambulatory Visit: Payer: Self-pay | Admitting: *Deleted

## 2015-01-08 MED ORDER — ELTROMBOPAG OLAMINE 50 MG PO TABS: ORAL_TABLET | ORAL | Status: DC

## 2015-01-27 ENCOUNTER — Telehealth: Payer: Self-pay | Admitting: *Deleted

## 2015-01-27 NOTE — Telephone Encounter (Signed)
Pt called states [facility] informed her that we need to call [employer] for prior authorization of her Promacta. [employer] [phone]   Pt just changed to [place]. Subscriber ID: [id number]; Group #[id number]. Name on card: Lenna Sciara [phone]

## 2015-01-28 ENCOUNTER — Encounter: Payer: Self-pay | Admitting: Hematology and Oncology

## 2015-01-28 NOTE — Progress Notes (Signed)
[employer] faxing promacta form for prior auth.

## 2015-01-28 NOTE — Progress Notes (Signed)
I placed promacta form on desk for dr. Edman Circle

## 2015-01-30 ENCOUNTER — Encounter: Payer: Self-pay | Admitting: Hematology and Oncology

## 2015-01-30 ENCOUNTER — Telehealth: Payer: Self-pay | Admitting: *Deleted

## 2015-01-30 NOTE — Telephone Encounter (Signed)
TC from patient regarding her promacta refill. Spoke with Tammi, RN who states the pre-authorization is in process.

## 2015-01-30 NOTE — Telephone Encounter (Signed)
Notified - form faxed to [facility] 01/29/15

## 2015-01-30 NOTE — Progress Notes (Signed)
I advised nurse-tammi. Form was faxed on yesterday and she will let the patient now we are still waiting on approval.

## 2015-01-31 ENCOUNTER — Encounter: Payer: Self-pay | Admitting: Hematology and Oncology

## 2015-01-31 ENCOUNTER — Telehealth: Payer: Self-pay | Admitting: *Deleted

## 2015-01-31 NOTE — Progress Notes (Signed)
I took denial of promacta to dr. Alvy Bimler for poss peer to peer.

## 2015-01-31 NOTE — Telephone Encounter (Signed)
TC from patient stating that she is still waiting on approval for her promacta. She states she has been without for 5 days and is wondering if she needs to have a platelet count done.

## 2015-01-31 NOTE — Telephone Encounter (Signed)
Spoke with patient. Dr Alvy Bimler to call [employer] on Monday. No labs needed at this time.

## 2015-02-03 ENCOUNTER — Other Ambulatory Visit: Payer: Self-pay | Admitting: Hematology and Oncology

## 2015-02-03 DIAGNOSIS — D693 Immune thrombocytopenic purpura: Secondary | ICD-10-CM

## 2015-02-03 MED ORDER — ELTROMBOPAG OLAMINE 50 MG PO TABS: ORAL_TABLET | ORAL | Status: DC

## 2015-02-04 ENCOUNTER — Telehealth: Payer: Self-pay | Admitting: *Deleted

## 2015-02-04 NOTE — Telephone Encounter (Signed)
Dr. Alvy Bimler s/w [employer] yesterday and they told her they would approve Promacta for one year.   Refill re sent to [facility] yesterday afternoon.   I called pt to notify her of approval and refill resent to [facility].  Asked her to contact [facility] and let us know if any problems.  She verbalized understanding.

## 2015-02-07 ENCOUNTER — Encounter: Payer: Self-pay | Admitting: Hematology and Oncology

## 2015-02-07 NOTE — Progress Notes (Signed)
Per [employer] approved 02/03/15-02/03/16 I sent to medical records ref# [id number]

## 2015-02-25 ENCOUNTER — Ambulatory Visit (INDEPENDENT_AMBULATORY_CARE_PROVIDER_SITE_OTHER): Payer: BLUE CROSS/BLUE SHIELD | Admitting: Oncology

## 2015-02-25 ENCOUNTER — Encounter: Payer: Self-pay | Admitting: Oncology

## 2015-02-25 ENCOUNTER — Encounter: Payer: Self-pay | Admitting: *Deleted

## 2015-02-25 VITALS — BP 149/71 | HR 72 | Temp 97.7°F | Ht 68.0 in | Wt 155.5 lb

## 2015-02-25 DIAGNOSIS — Z23 Encounter for immunization: Secondary | ICD-10-CM | POA: Diagnosis not present

## 2015-02-25 DIAGNOSIS — Z9081 Acquired absence of spleen: Secondary | ICD-10-CM | POA: Diagnosis not present

## 2015-02-25 DIAGNOSIS — D693 Immune thrombocytopenic purpura: Secondary | ICD-10-CM | POA: Diagnosis not present

## 2015-02-25 DIAGNOSIS — Z853 Personal history of malignant neoplasm of breast: Secondary | ICD-10-CM

## 2015-02-25 MED ORDER — AMOXICILLIN 500 MG PO CAPS: 2000.0000 mg | ORAL_CAPSULE | Freq: Once | ORAL | Status: DC

## 2015-02-25 MED ORDER — MENINGOCOCCAL VAC A,C,Y,W-135 ~~LOC~~ INJ
0.5000 mL | INJECTION | Freq: Once | SUBCUTANEOUS | Status: AC
  Administered 2015-02-25: 0.5 mL via SUBCUTANEOUS

## 2015-02-25 NOTE — Patient Instructions (Signed)
To lab today Mammograms at [facility] for August - please schedule Lab 6 months  MD visit 1-2 weeks after labs

## 2015-02-25 NOTE — Progress Notes (Signed)
Patient ID: Ronelle Nigh, female   DOB: 04-Mar-1960, 55 y.o.   MRN: [id number] Hematology and Oncology Follow Up Visit  ROSHA COCKER [id number] 1960/04/16 55 y.o. 02/25/2015 1:41 PM   Principle Diagnosis: Encounter Diagnoses  Name Primary?  . 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 History of breast cancer in female   . History of splenectomy Yes  . ITP (idiopathic thrombocytopenic purpura)    Clinical Summary: Pleasant 55 year old woman I have followed for many years at the cancer center. She now wants to reestablish with me at my new location. 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 She was put on N-plate given between May 2009 and November 2012 and had a excellent response. She was changed to [facility] in September 2012. She has also achieved a sustained response with platelet count consistently over 100,000. She continues on long-term Promacta.  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 She was treated with lumpectomy. Tumor was close to the nipple areolar complex which was removed with the surgery. She received postop radiation. She was put on tamoxifen hormonal therapy which she took for 5 years through June 2015. She had an endometrial biopsy 10/29/2012 for dysfunctional uterine bleeding. No signs of malignancy. Tamoxifen resumed. Subsequently stopped as noted above. She elected not to transition to an aromatase inhibitor or extend tamoxifen therapy for an additional 5 years. She continues to get annual mammograms most recent done 08/14/2014 showed no new disease.   Interim History:   She has had no interim medical problems. Platelet count remains in a safe range and she has had no clinical bleeding. No headache.  No change in vision. No new breast lumps. No swollen glands. No bone pain. No vaginal bleeding. No interim infections although she states she sometimes gets low-grade fevers. She is now 6 years post splenectomy. She is getting annual Pap smears through Dr. Irma Newness office. She left her previous job at [employer] and is much less stressed out. 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 She has a steady boyfriend. Her daughter is doing well. She was living in [place] now moving back to [place].   Medications: reviewed  Allergies: No Known Allergies  Review of Systems: See interim history  Remaining ROS negative:   Physical Exam: Blood pressure 149/71, pulse 72, temperature 97.7 F (36.5 C), temperature source Oral, height '5\' 8"'  (1.727 m), weight 155 lb 8 oz (70.534 kg), last menstrual period 06/05/2014, SpO2 100 %. Wt Readings from Last 3 Encounters:  02/25/15 155 lb 8 oz (70.534 kg)  01/02/15 155 lb 3.2 oz (70.398 kg)  07/19/14 151 lb (68.493 kg)     General appearance: Thin Caucasian woman HENNT: Pharynx no erythema, exudate, mass, or ulcer. No thyromegaly or thyroid nodules Lymph nodes: No cervical, supraclavicular, or axillary lymphadenopathy Breasts: No abnormal skin changes, surgical changes right breast status post partial mastectomy and removal of the nipple areolar complex, no dominant mass in either breast Lungs: Clear to auscultation, resonant to percussion throughout Heart: Regular rhythm, no murmur, no gallop, no rub, no click, no edema Abdomen: Soft, nontender, normal bowel sounds, no mass, no hepatomegaly; S/P splenectomy Extremities: No edema, no calf tenderness Musculoskeletal:  no joint deformities GU:  Vascular: Carotid pulses 2+, no bruits,  Neurologic: Alert, oriented, PERRLA, optic discs sharp and vessels normal, no hemorrhage or exudate, cranial nerves grossly normal, motor strength 5 over 5, reflexes 1+  symmetric, upper body coordination normal, gait normal, Skin: No rash or ecchymosis  Lab Results: CBC W/Diff    Component Value Date/Time   WBC 7.7 01/02/2015 0825   WBC 6.2 04/30/2014 1338   RBC 4.78 01/02/2015 0825   RBC 4.50 04/30/2014 1338   HGB 14.1 01/02/2015 0825   HGB 13.3 04/30/2014 1338   HCT 43.7 01/02/2015 0825   HCT 39.1 04/30/2014 1338   PLT 99* 01/02/2015 0825   PLT 244 04/30/2014 1338   MCV 91.3 01/02/2015 0825   MCV 86.9 04/30/2014 1338   MCH 29.5 01/02/2015 0825   MCH 29.6 04/30/2014 1338   MCHC 32.3 01/02/2015 0825   MCHC 34.0 04/30/2014 1338   RDW 14.2 01/02/2015 0825   RDW 15.5 04/30/2014 1338   LYMPHSABS 2.1 01/02/2015 0825   LYMPHSABS 2.1 04/30/2014 1338   MONOABS 0.9 01/02/2015 0825   MONOABS 0.7 04/30/2014 1338   EOSABS 0.3 01/02/2015 0825   EOSABS 0.2 04/30/2014 1338   BASOSABS 0.2* 01/02/2015 0825   BASOSABS 0.2* 04/30/2014 1338     Chemistry      Component Value Date/Time   NA 141 12/20/2013 1004   NA 138 03/10/2013 1357   K 4.1 12/20/2013 1004   K 4.2 03/10/2013 1357   CL 101 03/10/2013 1357   CL 107 02/16/2012 1436   CO2 28 12/20/2013 1004   CO2 26 03/10/2013 1357   BUN 6.9* 12/20/2013 1004   BUN 8 03/10/2013 1357   CREATININE 0.7 12/20/2013 1004   CREATININE 0.64 03/10/2013 1357      Component Value Date/Time   CALCIUM 9.3 12/20/2013 1004   CALCIUM 9.2 03/10/2013 1357   ALKPHOS 73 12/20/2013 1004   ALKPHOS 44 08/09/2011 1101   AST 19 12/20/2013 1004   AST 25 08/09/2011 1101   ALT 12 12/20/2013 1004   ALT 16 08/09/2011 1101   BILITOT 0.39 12/20/2013 1004   BILITOT 0.6 08/09/2011 1101       Radiological Studies: No results found.  Impression:  #1. Chronic ITP Platelet count remains in a safe range on Promacta 50 mg daily. Most recent count 01/02/2015 99,000 with hemoglobin 14.1. Plan: Continue Promacta. Check liver functions and a repeat CBC today. We were able to get her patient assistance for the Promacta through  the pharmaceutical company for which we are very grateful.  #2. 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 I will give her a booster doses of Pneumovax 23 vaccine and meningococcus vaccine today. I sent a prescription for amoxicillin 500 mg take 4 capsules at onset of any fever 101 or above or shaking chills then call M.D.  #3. Stage I, grade 2, Oncotype DX 28(intermediate risk), ER/PR positive, HER-2 equivocal treated with lumpectomy, breast radiation, and 5 years of tamoxifen hormonal therapy. No clinical or mammographic evidence for recurrence at this time. I will go ahead and schedule her follow-up mammogram for this coming August.  #4. Chronic anxiety and depression This has resolved with stabilization of her medical condition and change in work environment.  CC: Patient Care Team: Lysle Pearl, MD as PCP - General (Family Medicine)   Annia Belt, MD 2/21/20171:41 PM

## 2015-02-26 LAB — COMPREHENSIVE METABOLIC PANEL
ALT: 18 IU/L (ref 0–32)
AST: 24 IU/L (ref 0–40)
Albumin/Globulin Ratio: 1.5 (ref 1.1–2.5)
Albumin: 4 g/dL (ref 3.5–5.5)
Alkaline Phosphatase: 88 IU/L (ref 39–117)
BUN/Creatinine Ratio: 15 (ref 9–23)
BUN: 10 mg/dL (ref 6–24)
Bilirubin Total: 0.3 mg/dL (ref 0.0–1.2)
CO2: 25 mmol/L (ref 18–29)
Calcium: 9 mg/dL (ref 8.7–10.2)
Chloride: 102 mmol/L (ref 96–106)
Creatinine, Ser: 0.66 mg/dL (ref 0.57–1.00)
GFR calc Af Amer: 114 mL/min/{1.73_m2} (ref >59)
GFR calc non Af Amer: 99 mL/min/{1.73_m2} (ref >59)
Globulin, Total: 2.7 g/dL (ref 1.5–4.5)
Glucose: 82 mg/dL (ref 65–99)
Potassium: 4 mmol/L (ref 3.5–5.2)
Sodium: 142 mmol/L (ref 134–144)
Total Protein: 6.7 g/dL (ref 6.0–8.5)

## 2015-02-26 LAB — CBC WITH DIFFERENTIAL/PLATELET
Basophils Absolute: 0.1 10*3/uL (ref 0.0–0.2)
Basos: 1 %
EOS (ABSOLUTE): 0.2 10*3/uL (ref 0.0–0.4)
Eos: 3 %
Hematocrit: 41.2 % (ref 34.0–46.6)
Hemoglobin: 14 g/dL (ref 11.1–15.9)
Immature Grans (Abs): 0 10*3/uL (ref 0.0–0.1)
Immature Granulocytes: 0 %
Lymphocytes Absolute: 2.4 10*3/uL (ref 0.7–3.1)
Lymphs: 31 %
MCH: 30.7 pg (ref 26.6–33.0)
MCHC: 34 g/dL (ref 31.5–35.7)
MCV: 90 fL (ref 79–97)
Monocytes Absolute: 0.8 10*3/uL (ref 0.1–0.9)
Monocytes: 11 %
Neutrophils Absolute: 4.2 10*3/uL (ref 1.4–7.0)
Neutrophils: 54 %
Platelets: 77 10*3/uL — CL (ref 150–379)
RBC: 4.56 x10E6/uL (ref 3.77–5.28)
RDW: 14.9 % (ref 12.3–15.4)
WBC: 7.7 10*3/uL (ref 3.4–10.8)

## 2015-02-27 ENCOUNTER — Telehealth: Payer: Self-pay | Admitting: *Deleted

## 2015-02-27 NOTE — Telephone Encounter (Signed)
Pt called - no answer; left message liver tests are normal and platelets are 77,000 which is ok per Dr Beryle Beams.  And to call if she has any questions.

## 2015-02-27 NOTE — Telephone Encounter (Signed)
-----   Message from Annia Belt, MD sent at 02/26/2015  2:22 PM EST ----- Call pt: liver tests normal; platelets 77,000 - this is OK

## 2015-03-19 ENCOUNTER — Encounter: Payer: Self-pay | Admitting: Certified Nurse Midwife

## 2015-03-19 ENCOUNTER — Ambulatory Visit (INDEPENDENT_AMBULATORY_CARE_PROVIDER_SITE_OTHER): Payer: BLUE CROSS/BLUE SHIELD | Admitting: Certified Nurse Midwife

## 2015-03-19 VITALS — BP 120/78 | HR 70 | Resp 16 | Ht 67.0 in | Wt 156.0 lb

## 2015-03-19 DIAGNOSIS — Z124 Encounter for screening for malignant neoplasm of cervix: Secondary | ICD-10-CM | POA: Diagnosis not present

## 2015-03-19 DIAGNOSIS — Z1211 Encounter for screening for malignant neoplasm of colon: Secondary | ICD-10-CM | POA: Diagnosis not present

## 2015-03-19 DIAGNOSIS — Z01419 Encounter for gynecological examination (general) (routine) without abnormal findings: Secondary | ICD-10-CM

## 2015-03-19 NOTE — Progress Notes (Signed)
55 y.o. G41P2002 Divorced  Caucasian Fe here for annual exam.  Menopausal no HRT. Denies vaginal bleeding and vaginal dryness. Sees Dr Hux for labs, medication management for depression/anxiety and aex. Partner had occluded bile duct, so cared for him post surgery, doing well. No health issues today. 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 Planning trip out [place]!  Patient's last menstrual period was 06/05/2014.          Sexually active: Yes.    The current method of family planning is post menopausal status.    Exercising: Yes.    walking & yardwork Smoker:  no  Health Maintenance: Pap:  03-18-14 neg HPV HR neg MMG:  08-14-14 category b density,birads 2:neg Colonoscopy:  Request referral this year BMD:  none TDaP:  2015 Shingles: no Pneumonia: 2017 Hep C and HIV: neg Labs: none Self breast exam: done monthly   reports that she quit smoking about 2 years ago. She has never used smokeless tobacco. She reports that she drinks about 4.8 oz of alcohol per week. She reports that she does not use illicit drugs.  Past Medical History  Diagnosis Date  . Depression with anxiety 11/16/2010  . Bronchiolitis   . Abnormal uterine bleeding   . Depression   . ITP (idiopathic thrombocytopenic purpura)   . Alcohol abuse, in remission   . Fibroid 1992  . STD (sexually transmitted disease)     positive HPV  . Cancer ([place]) 07/2007    breast  . Cellulitis     Past Surgical History  Procedure Laterality Date  . Bone marrow biopsy  09/2000    back hip  . Breast surgery  01/2000     left breast mass/ benign  . Endometrial biopsy    . Ear bone surgery      Current Outpatient Prescriptions  Medication Sig Dispense Refill  . buPROPion (WELLBUTRIN XL) 300 MG 24 hr tablet Take 300 mg by mouth.    . Cholecalciferol 5000 UNITS TABS Take 1 tablet by mouth daily.    . citalopram (CELEXA) 40 MG tablet Take by mouth Daily.     Marland Kitchen eltrombopag (PROMACTA) 50 MG tablet Take 1 tablet by mouth  daily  30 tablet 5  . Omega 3 1200 MG CAPS Take 1 capsule by mouth daily.    Marland Kitchen amoxicillin (AMOXIL) 500 MG capsule Take 4 capsules (2,000 mg total) by mouth once. Take immediately for onset of unexplained fever then contact MD for further instructions (Patient not taking: Reported on 03/19/2015) 8 capsule 6   No current facility-administered medications for this visit.    Family History  Problem Relation Age of Onset  . Hypertension Mother   . Thyroid disease Mother   . Irregular heart beat Father   . Breast cancer Maternal Aunt   . Tuberculosis Maternal Grandmother   . Diabetes Paternal Grandmother   . Cancer Maternal Grandfather     lung    ROS:  Pertinent items are noted in HPI.  Otherwise, a comprehensive ROS was negative.  Exam:   BP 120/78 mmHg  Pulse 70  Resp 16  Ht _0  (1.702 m)  Wt 156 lb (70.761 kg)  BMI 24.43 kg/m2  LMP 06/05/2014 Height: _1  (170.2 cm) Ht Readings from Last 3 Encounters:  03/19/15 _2  (1.702 m)  02/25/15 _3  (1.727 m)  01/02/15 _4  (1.702 m)    General appearance: alert, cooperative and appears stated age Head: Normocephalic, without obvious abnormality, atraumatic Neck: no  adenopathy, supple, symmetrical, trachea midline and thyroid normal to inspection and palpation Lungs: clear to auscultation bilaterally Breasts: normal appearance, no masses or tenderness, No nipple retraction or dimpling, No nipple discharge or bleeding, No axillary or supraclavicular adenopathy Heart: regular rate and rhythm Abdomen: soft, non-tender; no masses,  no organomegaly Extremities: extremities normal, atraumatic, no cyanosis or edema Skin: Skin color, texture, turgor normal. No rashes or lesions Lymph nodes: Cervical, supraclavicular, and axillary nodes normal. No abnormal inguinal nodes palpated Neurologic: Grossly normal   Pelvic: External genitalia:  no lesions              Urethra:  normal appearing urethra with no masses, tenderness or lesions               Bartholin's and Skene's: normal                 Vagina: normal appearing vagina with normal color and discharge, no lesions              Cervix: normal,non tender, no lesions              Pap taken: Yes.   Bimanual Exam:  Uterus:  normal size, contour, position, consistency, mobility, non-tender              Adnexa: normal adnexa and no mass, fullness, tenderness               Rectovaginal: Confirms               Anus:  normal sphincter tone, no lesions  Chaperone present: yes  A:  Well Woman with normal exam  Menopausal no HRT  History of breast cancer, no medications now  History of fibroids no change in exam  Anxiety and depression with PCP management  Colonoscopy due  Thrombocytopenic purpura under good control with medication with hematology  P:   Reviewed health and wellness pertinent to exam  Aware of need to evaluate if vaginal bleeding  Discussed importance of SBE and yearly mammogram  Continue follow up as indicated  Patient ready to schedule colonoscopy. Referral will be done and patient will be called with information.  Pap smear as above taken   counseled on breast self exam, mammography screening, adequate intake of calcium and vitamin D, exercise and good diet.  return annually or prn  An After Visit Summary was printed and given to the patient.

## 2015-03-19 NOTE — Patient Instructions (Signed)

## 2015-03-21 NOTE — Progress Notes (Signed)
Encounter reviewed Jodine Muchmore, MD   

## 2015-03-24 LAB — IPS PAP TEST WITH REFLEX TO HPV

## 2015-03-26 ENCOUNTER — Telehealth: Payer: Self-pay | Admitting: Certified Nurse Midwife

## 2015-03-26 NOTE — Telephone Encounter (Signed)
Left voicemail regarding referral appointment. The information is listed below. Should the patient need to cancel or reschedule this appointment, please advise them to call the office they've been referred to in order to reschedule.  [facility] Dr Collene Mares [address]  [phone]  Scheduled 04/22/15 @ 10am with Dr Collene Mares. Please arrive 15 minutes early for registration with insurance card and photo id. This is the initial appointment only. The colonoscopy procedure is scheduled at the office.

## 2015-06-11 ENCOUNTER — Encounter: Payer: Self-pay | Admitting: Certified Nurse Midwife

## 2015-06-11 ENCOUNTER — Ambulatory Visit (INDEPENDENT_AMBULATORY_CARE_PROVIDER_SITE_OTHER): Payer: BLUE CROSS/BLUE SHIELD | Admitting: Certified Nurse Midwife

## 2015-06-11 VITALS — BP 120/80 | HR 60 | Resp 14 | Wt 152.4 lb

## 2015-06-11 DIAGNOSIS — B373 Candidiasis of vulva and vagina: Secondary | ICD-10-CM | POA: Diagnosis not present

## 2015-06-11 DIAGNOSIS — B3731 Acute candidiasis of vulva and vagina: Secondary | ICD-10-CM

## 2015-06-11 DIAGNOSIS — Z113 Encounter for screening for infections with a predominantly sexual mode of transmission: Secondary | ICD-10-CM

## 2015-06-11 MED ORDER — TERCONAZOLE 0.4 % VA CREA: 1.0000 | TOPICAL_CREAM | Freq: Every day | VAGINAL | Status: DC

## 2015-06-11 MED ORDER — NYSTATIN-TRIAMCINOLONE 100000-0.1 UNIT/GM-% EX CREA: 1.0000 "application " | TOPICAL_CREAM | Freq: Two times a day (BID) | CUTANEOUS | Status: DC

## 2015-06-11 NOTE — Progress Notes (Signed)
55 y.o. Divorced Caucasian female G2P2002 here with complaint of vaginal symptoms of itching, for the past 2 days. Stays moist with working outside. No new personal products. No partner change, but he does use a different type of detergent.Patient has some poison ivy on legs and some bug bites.Marland Kitchen Has been doing yard work for others and sweating a lot. Some rash on chest which does not itch. Has this type of rash when working outside.Marland Kitchen Describes discharge as only pink with intercourse occasionally. No bleeding, some vaginal dryness, using oil for lubrication ? Type of oil. Onset of symptoms 2 days ago. Pt states roommate uses detergent she is allergic too and may have residue  on her clothes. Desires STD screening vaginal only. Urinary symptoms none . Contraception is menopausal. No other health issues today.    O:Healthy female WDWN Affect: normal, orientation x 3  Exam:Skin: upper arms, insect bites noted, no pustules, blister type rash on ankles, poison ivy appearance, fine macular rash on chest, no pustules or redness, all non tender.  Abdomen: soft, non tender Lymph node: no enlargement or tenderness Pelvic exam: External genital: normal female with increase redness and scaling with exudate on vulva bilateral, no lesions or blisters, wet prep taken BUS: negative Vagina: scant non odorous white discharge noted. Ph:4.0   ,Wet prep taken, Affirm taken Cervix: normal, non tender, no CMT, no blood noted from cervix Uterus: normal, non tender Adnexa:normal, non tender, no masses or fullness noted   Wet Prep results: positive for yeast external and internal    A:Normal pelvic exam Yeast vaginitis/vulvitis ? Contact dermatitis Poison Ivy STD screening   P:Discussed findings of yeast vaginitis and vulvitis and etiology. Discussed Aveeno or  sitz bath for comfort. Avoid moist clothes for extended period of time. If working out in yard for long periods of time change underwear if possible.  Discussed protective skin spray for bites or long loose sleeves and pants to prevent contact. Coconut Oil use for skin protection prior to activity can be used to external skin for protection and vaginal dryness. Shower as soon as possible after exposure to avoid skin outbreaks. Needs to advise if bleeding occurs with sexual activity. Questions addressed regarding above and also regarding OTC multivitamin supplement.  Rx: Terazol 7 vaginal cream Rx Mycolog cream with instructions Labs: GC,Chlamydia, Affirm  Rv prn

## 2015-06-11 NOTE — Patient Instructions (Signed)

## 2015-06-12 LAB — WET PREP BY MOLECULAR PROBE
Candida species: NEGATIVE
Gardnerella vaginalis: NEGATIVE
Trichomonas vaginosis: NEGATIVE

## 2015-06-13 LAB — IPS N GONORRHOEA AND CHLAMYDIA BY PCR

## 2015-06-13 NOTE — Progress Notes (Signed)
Reviewed personally.  M. Suzanne Katilynn Sinkler, MD.  

## 2015-07-03 ENCOUNTER — Other Ambulatory Visit: Payer: 59

## 2015-07-03 ENCOUNTER — Ambulatory Visit: Payer: 59 | Admitting: Hematology and Oncology

## 2015-08-11 ENCOUNTER — Other Ambulatory Visit (INDEPENDENT_AMBULATORY_CARE_PROVIDER_SITE_OTHER): Payer: BLUE CROSS/BLUE SHIELD

## 2015-08-11 DIAGNOSIS — D693 Immune thrombocytopenic purpura: Secondary | ICD-10-CM

## 2015-08-12 LAB — CBC WITH DIFFERENTIAL/PLATELET
Basophils Absolute: 0.1 10*3/uL (ref 0.0–0.2)
Basos: 1 %
EOS (ABSOLUTE): 0.3 10*3/uL (ref 0.0–0.4)
Eos: 3 %
Hematocrit: 41.4 % (ref 34.0–46.6)
Hemoglobin: 13.8 g/dL (ref 11.1–15.9)
Immature Grans (Abs): 0 10*3/uL (ref 0.0–0.1)
Immature Granulocytes: 0 %
Lymphocytes Absolute: 2.3 10*3/uL (ref 0.7–3.1)
Lymphs: 27 %
MCH: 30.1 pg (ref 26.6–33.0)
MCHC: 33.3 g/dL (ref 31.5–35.7)
MCV: 90 fL (ref 79–97)
Monocytes Absolute: 0.8 10*3/uL (ref 0.1–0.9)
Monocytes: 10 %
Neutrophils Absolute: 4.9 10*3/uL (ref 1.4–7.0)
Neutrophils: 59 %
Platelets: 175 10*3/uL (ref 150–379)
RBC: 4.59 x10E6/uL (ref 3.77–5.28)
RDW: 15.4 % (ref 12.3–15.4)
WBC: 8.4 10*3/uL (ref 3.4–10.8)

## 2015-08-12 LAB — COMPREHENSIVE METABOLIC PANEL
ALT: 16 IU/L (ref 0–32)
AST: 25 IU/L (ref 0–40)
Albumin/Globulin Ratio: 1.5 (ref 1.2–2.2)
Albumin: 4 g/dL (ref 3.5–5.5)
Alkaline Phosphatase: 77 IU/L (ref 39–117)
BUN/Creatinine Ratio: 13 (ref 9–23)
BUN: 8 mg/dL (ref 6–24)
Bilirubin Total: 0.5 mg/dL (ref 0.0–1.2)
CO2: 24 mmol/L (ref 18–29)
Calcium: 9.2 mg/dL (ref 8.7–10.2)
Chloride: 104 mmol/L (ref 96–106)
Creatinine, Ser: 0.63 mg/dL (ref 0.57–1.00)
GFR calc Af Amer: 116 mL/min/{1.73_m2} (ref >59)
GFR calc non Af Amer: 101 mL/min/{1.73_m2} (ref >59)
Globulin, Total: 2.6 g/dL (ref 1.5–4.5)
Glucose: 83 mg/dL (ref 65–99)
Potassium: 4.1 mmol/L (ref 3.5–5.2)
Sodium: 143 mmol/L (ref 134–144)
Total Protein: 6.6 g/dL (ref 6.0–8.5)

## 2015-08-14 ENCOUNTER — Telehealth: Payer: Self-pay | Admitting: *Deleted

## 2015-08-14 NOTE — Telephone Encounter (Signed)
Pt called - no answer, left message  "platelets great at 175,000 " per Dr Beryle Beams.  And to call if has any questions.

## 2015-08-14 NOTE — Telephone Encounter (Signed)
-----   Message from Annia Belt, MD sent at 08/12/2015  6:17 PM EDT ----- Call pt: platelets great at 175,000!

## 2015-08-15 ENCOUNTER — Telehealth: Payer: Self-pay | Admitting: *Deleted

## 2015-08-15 NOTE — Telephone Encounter (Signed)
Received Refill request for Promacta from [facility].   Pt has transferred her care to Dr. Beryle Beams and sees him on Monday 8/14.  I called pt and left her a VM informing her of refill request received and will defer to Dr. Beryle Beams since she is under his care now.

## 2015-08-18 ENCOUNTER — Encounter: Payer: Self-pay | Admitting: Oncology

## 2015-08-18 ENCOUNTER — Ambulatory Visit (INDEPENDENT_AMBULATORY_CARE_PROVIDER_SITE_OTHER): Payer: BLUE CROSS/BLUE SHIELD | Admitting: Oncology

## 2015-08-18 VITALS — BP 144/68 | HR 74 | Temp 98.3°F | Ht 67.0 in | Wt 156.8 lb

## 2015-08-18 DIAGNOSIS — F418 Other specified anxiety disorders: Secondary | ICD-10-CM | POA: Diagnosis not present

## 2015-08-18 DIAGNOSIS — Z9081 Acquired absence of spleen: Secondary | ICD-10-CM | POA: Diagnosis not present

## 2015-08-18 DIAGNOSIS — Z853 Personal history of malignant neoplasm of breast: Secondary | ICD-10-CM

## 2015-08-18 DIAGNOSIS — D693 Immune thrombocytopenic purpura: Secondary | ICD-10-CM

## 2015-08-18 MED ORDER — ELTROMBOPAG OLAMINE 50 MG PO TABS
ORAL_TABLET | ORAL | 5 refills | Status: DC
Start: 2015-08-18 — End: 2016-03-24

## 2015-08-18 NOTE — Patient Instructions (Signed)
Lab every 3 months next 11/08/15 MD visit 6 months

## 2015-08-18 NOTE — Progress Notes (Signed)
Hematology and Oncology Follow Up Visit  TOMECA HELM [id number] Jun 26, 1960 55 y.o. 08/18/2015 11:33 AM   Principle Diagnosis: Encounter Diagnoses  Name Primary?  . 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 History of breast cancer in female   Clinical summary: Pleasant 55 year old woman I have followed for many years at the cancer center.   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 She was put on N-plate given between May 2009 and November 2012 and had an excellent response. She was changed to [facility] in September 2012. She has also achieved a sustained response with platelet count consistently over 100,000. She continues on long-term Promacta.  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  At that time, she had uncontrolled ITP previously followed by another hematologist. Her breast cancer was treated with lumpectomy. Tumor was close to the nipple areolar complex which was removed with the surgery. She received postop radiation. She was put on tamoxifen hormonal therapy which she took for 5 years through June 2015. She had an endometrial biopsy 10/29/2012 for dysfunctional uterine bleeding. No signs of malignancy. Tamoxifen resumed. Subsequently stopped as noted above. She elected not to transition to an aromatase inhibitor or extend tamoxifen therapy for an additional 5 years. She continues to get annual mammograms most recent done today 08/18/2015 and results are pending.   Interim History:   She has had no interim medical problems. She had a colonoscopy in July by Dr. Collene Mares and was told that everything was okay. She had a mammogram today. Results pending. She has not noticed any new lumps. Routine lab done in  anticipation of today's visit showed her platelet count now normal at 175,000 which is the highest results and she has been on the Promacta. She has had no interim bleeding. No major interim infections although she did get a flu like illness and called me about a month ago. She was running a low-grade fever. In view of her splenectomy status, I did advise using prophylactic antibiotics. Symptoms did not progress.  Her son works in Radio producer in [place]. Her daughter is currently living with her boyfriend in [place].  Medications: reviewed  Allergies: No Known Allergies  Review of Systems: See interim history Remaining ROS negative:   Physical Exam: Blood pressure (!) 144/68, pulse 74, temperature 98.3 F (36.8 C), temperature source Oral, height _0  (1.702 m), weight 156 lb 12.8 oz (71.1 kg), last menstrual period 06/05/2014, SpO2 100 %. Wt Readings from Last 3 Encounters:  08/18/15 156 lb 12.8 oz (71.1 kg)  06/11/15 152 lb 6.4 oz (69.1 kg)  03/19/15 156 lb (70.8 kg)     General appearance: Thin but adequately nourished Caucasian woman HENNT: Pharynx no erythema, exudate, mass, or ulcer. No thyromegaly or thyroid nodules Lymph nodes: No cervical, supraclavicular, or axillary lymphadenopathy Breasts: Absent right nipple areolar complex from previous breast cancer surgery. No dominant mass in either breast. Lungs: Clear to auscultation, resonant to percussion throughout Heart: Regular rhythm, no murmur, no gallop, no rub, no click, no edema Abdomen: Soft, nontender, normal bowel sounds, no mass, no hepatomegaly. Status post splenectomy. Extremities: No edema, no calf tenderness Musculoskeletal: no joint deformities GU:  Vascular: Carotid pulses 2+, no bruits, distal pulses: Dorsalis pedis 1+ symmetric Neurologic: Alert,  oriented, PERRLA,  cranial nerves grossly normal, motor strength 5 over 5, reflexes 1+ symmetric, upper body coordination normal, gait normal, Skin: No rash or  ecchymosis  Lab Results: CBC W/Diff    Component Value Date/Time   WBC 8.4 08/11/2015 0947   WBC 7.7 01/02/2015 0825   WBC 6.2 04/30/2014 1338   RBC 4.59 08/11/2015 0947   RBC 4.78 01/02/2015 0825   RBC 4.50 04/30/2014 1338   HGB 14.1 01/02/2015 0825   HCT 41.4 08/11/2015 0947   HCT 43.7 01/02/2015 0825   PLT 175 08/11/2015 0947   MCV 90 08/11/2015 0947   MCV 91.3 01/02/2015 0825   MCH 30.1 08/11/2015 0947   MCH 29.5 01/02/2015 0825   MCH 29.6 04/30/2014 1338   MCHC 33.3 08/11/2015 0947   MCHC 32.3 01/02/2015 0825   MCHC 34.0 04/30/2014 1338   RDW 15.4 08/11/2015 0947   RDW 14.2 01/02/2015 0825   LYMPHSABS 2.3 08/11/2015 0947   LYMPHSABS 2.1 01/02/2015 0825   MONOABS 0.9 01/02/2015 0825   EOSABS 0.3 08/11/2015 0947   BASOSABS 0.1 08/11/2015 0947   BASOSABS 0.2 (H) 01/02/2015 0825     Chemistry      Component Value Date/Time   NA 143 08/11/2015 0947   NA 141 12/20/2013 1004   K 4.1 08/11/2015 0947   K 4.1 12/20/2013 1004   CL 104 08/11/2015 0947   CL 107 02/16/2012 1436   CO2 24 08/11/2015 0947   CO2 28 12/20/2013 1004   BUN 8 08/11/2015 0947   BUN 6.9 (L) 12/20/2013 1004   CREATININE 0.63 08/11/2015 0947   CREATININE 0.7 12/20/2013 1004      Component Value Date/Time   CALCIUM 9.2 08/11/2015 0947   CALCIUM 9.3 12/20/2013 1004   ALKPHOS 77 08/11/2015 0947   ALKPHOS 73 12/20/2013 1004   AST 25 08/11/2015 0947   AST 19 12/20/2013 1004   ALT 16 08/11/2015 0947   ALT 12 12/20/2013 1004   BILITOT 0.5 08/11/2015 0947   BILITOT 0.39 12/20/2013 1004       Radiological Studies: No results found.  Impression:  #1. Chronic ITP Ongoing response to oral Promacta. Continue the same. Monitor CBC and liver function every 3 months.  #2. 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 She received booster doses of Pneumovax 23 vaccine and meningococcus vaccine on 02/25/2015. I sent a prescription for amoxicillin 500 mg take 4 capsules at onset of any fever 101 or above  or shaking chills then call M.D.  #3. Stage I, grade 2, Oncotype DX 28(intermediate risk), ER/PR positive, HER-2 equivocal treated with lumpectomy, breast radiation, and 5 years of tamoxifen hormonal therapy. No clinical or mammographic evidence for recurrence at this time.  #4. Chronic anxiety and depression This has resolved with stabilization of her medical condition and change in work environment. She continues on Celexa. She is not sure how well it is working but I encouraged her to stay on it. 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  CC: Patient Care Team: Lysle Pearl, MD as PCP - General (Family Medicine)   Annia Belt, MD 8/14/201711:33 AM

## 2015-09-10 ENCOUNTER — Encounter: Payer: Self-pay | Admitting: Certified Nurse Midwife

## 2015-11-10 ENCOUNTER — Other Ambulatory Visit (INDEPENDENT_AMBULATORY_CARE_PROVIDER_SITE_OTHER): Payer: BLUE CROSS/BLUE SHIELD

## 2015-11-10 DIAGNOSIS — D693 Immune thrombocytopenic purpura: Secondary | ICD-10-CM

## 2015-11-10 DIAGNOSIS — Z853 Personal history of malignant neoplasm of breast: Secondary | ICD-10-CM

## 2015-11-11 LAB — COMPREHENSIVE METABOLIC PANEL
ALT: 16 IU/L (ref 0–32)
AST: 22 IU/L (ref 0–40)
Albumin/Globulin Ratio: 1.6 (ref 1.2–2.2)
Albumin: 3.9 g/dL (ref 3.5–5.5)
Alkaline Phosphatase: 91 IU/L (ref 39–117)
BUN/Creatinine Ratio: 16 (ref 9–23)
BUN: 10 mg/dL (ref 6–24)
Bilirubin Total: 0.3 mg/dL (ref 0.0–1.2)
CO2: 26 mmol/L (ref 18–29)
Calcium: 9.3 mg/dL (ref 8.7–10.2)
Chloride: 101 mmol/L (ref 96–106)
Creatinine, Ser: 0.62 mg/dL (ref 0.57–1.00)
GFR calc Af Amer: 117 mL/min/{1.73_m2} (ref >59)
GFR calc non Af Amer: 101 mL/min/{1.73_m2} (ref >59)
Globulin, Total: 2.5 g/dL (ref 1.5–4.5)
Glucose: 75 mg/dL (ref 65–99)
Potassium: 4.3 mmol/L (ref 3.5–5.2)
Sodium: 143 mmol/L (ref 134–144)
Total Protein: 6.4 g/dL (ref 6.0–8.5)

## 2015-11-11 LAB — CBC WITH DIFFERENTIAL/PLATELET
Basophils Absolute: 0.1 10*3/uL (ref 0.0–0.2)
Basos: 2 %
EOS (ABSOLUTE): 0.2 10*3/uL (ref 0.0–0.4)
Eos: 2 %
Hematocrit: 42.3 % (ref 34.0–46.6)
Hemoglobin: 13.9 g/dL (ref 11.1–15.9)
Immature Grans (Abs): 0 10*3/uL (ref 0.0–0.1)
Immature Granulocytes: 0 %
Lymphocytes Absolute: 2.2 10*3/uL (ref 0.7–3.1)
Lymphs: 26 %
MCH: 30.1 pg (ref 26.6–33.0)
MCHC: 32.9 g/dL (ref 31.5–35.7)
MCV: 92 fL (ref 79–97)
Monocytes Absolute: 1.1 10*3/uL — ABNORMAL HIGH (ref 0.1–0.9)
Monocytes: 13 %
Neutrophils Absolute: 4.7 10*3/uL (ref 1.4–7.0)
Neutrophils: 57 %
Platelets: 183 10*3/uL (ref 150–379)
RBC: 4.62 x10E6/uL (ref 3.77–5.28)
RDW: 14.4 % (ref 12.3–15.4)
WBC: 8.3 10*3/uL (ref 3.4–10.8)

## 2015-11-12 ENCOUNTER — Telehealth: Payer: Self-pay | Admitting: *Deleted

## 2015-11-12 NOTE — Telephone Encounter (Signed)
Pt called - no answer; left message "plats [phone]: she won the lottery! " per Dr Beryle Beams. And to call for any questions.

## 2015-11-12 NOTE — Telephone Encounter (Signed)
-----   Message from Annia Belt, MD sent at 11/11/2015  2:22 PM EST ----- Call pt: plats [phone]: she won the lottery!

## 2016-02-06 ENCOUNTER — Telehealth: Payer: Self-pay | Admitting: Family Medicine

## 2016-02-06 NOTE — Telephone Encounter (Signed)
APT. REMINDER CALL, LMTCB °

## 2016-02-09 ENCOUNTER — Other Ambulatory Visit (INDEPENDENT_AMBULATORY_CARE_PROVIDER_SITE_OTHER): Payer: BLUE CROSS/BLUE SHIELD

## 2016-02-09 DIAGNOSIS — Z853 Personal history of malignant neoplasm of breast: Secondary | ICD-10-CM

## 2016-02-09 DIAGNOSIS — D693 Immune thrombocytopenic purpura: Secondary | ICD-10-CM | POA: Diagnosis not present

## 2016-02-10 LAB — CBC WITH DIFFERENTIAL/PLATELET
Basophils Absolute: 0.1 10*3/uL (ref 0.0–0.2)
Basos: 2 %
EOS (ABSOLUTE): 0.2 10*3/uL (ref 0.0–0.4)
Eos: 2 %
Hematocrit: 43.5 % (ref 34.0–46.6)
Hemoglobin: 14.2 g/dL (ref 11.1–15.9)
Immature Grans (Abs): 0 10*3/uL (ref 0.0–0.1)
Immature Granulocytes: 0 %
Lymphocytes Absolute: 2.1 10*3/uL (ref 0.7–3.1)
Lymphs: 31 %
MCH: 29.7 pg (ref 26.6–33.0)
MCHC: 32.6 g/dL (ref 31.5–35.7)
MCV: 91 fL (ref 79–97)
Monocytes Absolute: 0.8 10*3/uL (ref 0.1–0.9)
Monocytes: 12 %
Neutrophils Absolute: 3.7 10*3/uL (ref 1.4–7.0)
Neutrophils: 53 %
Platelets: 147 10*3/uL — ABNORMAL LOW (ref 150–379)
RBC: 4.78 x10E6/uL (ref 3.77–5.28)
RDW: 14.6 % (ref 12.3–15.4)
WBC: 6.9 10*3/uL (ref 3.4–10.8)

## 2016-02-10 LAB — COMPREHENSIVE METABOLIC PANEL
ALT: 11 IU/L (ref 0–32)
AST: 18 IU/L (ref 0–40)
Albumin/Globulin Ratio: 1.8 (ref 1.2–2.2)
Albumin: 4.2 g/dL (ref 3.5–5.5)
Alkaline Phosphatase: 77 IU/L (ref 39–117)
BUN/Creatinine Ratio: 16 (ref 9–23)
BUN: 10 mg/dL (ref 6–24)
Bilirubin Total: 0.3 mg/dL (ref 0.0–1.2)
CO2: 27 mmol/L (ref 18–29)
Calcium: 9.1 mg/dL (ref 8.7–10.2)
Chloride: 100 mmol/L (ref 96–106)
Creatinine, Ser: 0.64 mg/dL (ref 0.57–1.00)
GFR calc Af Amer: 115 mL/min/{1.73_m2} (ref >59)
GFR calc non Af Amer: 99 mL/min/{1.73_m2} (ref >59)
Globulin, Total: 2.4 g/dL (ref 1.5–4.5)
Glucose: 68 mg/dL (ref 65–99)
Potassium: 4 mmol/L (ref 3.5–5.2)
Sodium: 140 mmol/L (ref 134–144)
Total Protein: 6.6 g/dL (ref 6.0–8.5)

## 2016-02-11 ENCOUNTER — Telehealth: Payer: Self-pay | Admitting: *Deleted

## 2016-02-11 NOTE — Telephone Encounter (Signed)
-----   Message from Annia Belt, MD sent at 02/10/2016  4:45 PM EST ----- Call pt: platelets 147,000. We will discuss stopping Promacta at time of visit next week

## 2016-02-11 NOTE — Telephone Encounter (Signed)
Tell her OK - we will see what her platelet count is off drug for a week

## 2016-02-11 NOTE — Telephone Encounter (Signed)
Pt called / informed "platelets 147,000. We will discuss stopping Promacta at time of visit next week" per Dr Beryle Beams. Stated she is out of Promacta; needs prior authorization with her new insurance co and will be out for a week by the time of her appt.

## 2016-02-12 NOTE — Telephone Encounter (Signed)
Pt informed will see what her platelet count is off Promacta for a week per Dr Beryle Beams. Voiced understanding.

## 2016-02-16 ENCOUNTER — Encounter: Payer: Self-pay | Admitting: Oncology

## 2016-02-16 ENCOUNTER — Other Ambulatory Visit: Payer: Self-pay | Admitting: *Deleted

## 2016-02-16 ENCOUNTER — Ambulatory Visit (INDEPENDENT_AMBULATORY_CARE_PROVIDER_SITE_OTHER): Payer: BLUE CROSS/BLUE SHIELD | Admitting: Oncology

## 2016-02-16 VITALS — BP 136/69 | HR 90 | Temp 98.0°F | Ht 68.0 in | Wt 155.6 lb

## 2016-02-16 DIAGNOSIS — Z79899 Other long term (current) drug therapy: Secondary | ICD-10-CM | POA: Diagnosis not present

## 2016-02-16 DIAGNOSIS — Z853 Personal history of malignant neoplasm of breast: Secondary | ICD-10-CM | POA: Diagnosis not present

## 2016-02-16 DIAGNOSIS — D693 Immune thrombocytopenic purpura: Secondary | ICD-10-CM

## 2016-02-16 DIAGNOSIS — Z923 Personal history of irradiation: Secondary | ICD-10-CM

## 2016-02-16 DIAGNOSIS — Z87891 Personal history of nicotine dependence: Secondary | ICD-10-CM

## 2016-02-16 DIAGNOSIS — F418 Other specified anxiety disorders: Secondary | ICD-10-CM

## 2016-02-16 DIAGNOSIS — Z9081 Acquired absence of spleen: Secondary | ICD-10-CM

## 2016-02-16 NOTE — Patient Instructions (Addendum)
To lab today Weekly CBC x 4 then resume every 3 month lab unless otherwise instructed MD visit 3-4 months

## 2016-02-16 NOTE — Progress Notes (Signed)
Hematology and Oncology Follow Up Visit  Yvonne Hoover [id number] 1960/09/30 56 y.o. 02/16/2016 3:08 PM   Principle Diagnosis: Encounter Diagnoses  Name Primary?  . 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 History of breast cancer in female   Clinical summary: 56 year old woman I have followed for many years at the cancer center.   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 She was put on N-plate given between May 2009 and November 2012 and had an excellent response. She was changed to [facility] in September 2012. She has also achieved a sustained response with platelet count consistently over 100,000. She continues on long-term Promacta.  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 At that time, she had uncontrolled ITP previously followed by another hematologist. Her breast cancer was treated with lumpectomy. Tumor was close to the nipple areolar complex which was removed with the surgery. She received postop radiation. She was put on tamoxifen hormonal therapy which she took for 5 years through June 2015. She had an endometrial biopsy 10/29/2012 for dysfunctional uterine bleeding. No signs of malignancy. Tamoxifen resumed. Subsequently stopped as noted above. She elected not to transition to an aromatase inhibitor or extend tamoxifen therapy for an additional 5 years. She continues to get annual mammograms most recent done today 08/18/2015 and results are pending.   Interim History:   Overall doing well with no interim medical problems. Mammograms done at time of last visit in August 2017 with no signs of recurrence and no new areas of concern. She has had an ongoing response to Promacta. In fact, her platelet count has  gone as high as 183,000 recently.  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 Her daughter moved to [place] with her fianc. He was deported secondary to illegal alien status.  Medications: reviewed  Allergies: No Known Allergies  Review of Systems:   Remaining ROS negative:   Physical Exam: Blood pressure 136/69, pulse 90, temperature 98 F (36.7 C), temperature source Oral, height '5\' 8"'  (1.727 m), weight 155 lb 9.6 oz (70.6 kg), last menstrual period 06/05/2014, SpO2 97 %. Wt Readings from Last 3 Encounters:  02/16/16 155 lb 9.6 oz (70.6 kg)  08/18/15 156 lb 12.8 oz (71.1 kg)  06/11/15 152 lb 6.4 oz (69.1 kg)     General appearance: Well-nourished Caucasian woman HENNT: Pharynx no erythema, exudate, mass, or ulcer. No thyromegaly or thyroid nodules Lymph nodes: No cervical, supraclavicular, or axillary lymphadenopathy Breasts: Breast exam done at time of August 2017 visit. Lungs: Clear to auscultation, resonant to percussion throughout Heart: Regular rhythm, no murmur, no gallop, no rub, no click, no edema Abdomen: Soft, nontender, normal bowel sounds, no mass, no organomegaly Extremities: No edema, no calf tenderness Musculoskeletal: no joint deformities GU:  Vascular: Carotid pulses 2+, no bruits, Neurologic: Alert, oriented, PERRLA, optic discs sharp and vessels normal, no hemorrhage or exudate, cranial nerves grossly normal, motor strength 5 over 5, reflexes 2+ symmetric, upper body coordination normal, gait normal, Skin: No rash or ecchymosis  Lab Results: CBC W/Diff    Component Value Date/Time   WBC 6.9 02/09/2016 1059   WBC 7.7 01/02/2015 0825   WBC 6.2 04/30/2014 1338   RBC 4.78 02/09/2016 1059   RBC  4.78 01/02/2015 0825   RBC 4.50 04/30/2014 1338   HGB 14.1 01/02/2015 0825   HCT 43.5 02/09/2016 1059   HCT 43.7 01/02/2015 0825   PLT 147 (L) 02/09/2016 1059   MCV 91 02/09/2016 1059   MCV 91.3  01/02/2015 0825   MCH 29.7 02/09/2016 1059   MCH 29.5 01/02/2015 0825   MCH 29.6 04/30/2014 1338   MCHC 32.6 02/09/2016 1059   MCHC 32.3 01/02/2015 0825   MCHC 34.0 04/30/2014 1338   RDW 14.6 02/09/2016 1059   RDW 14.2 01/02/2015 0825   LYMPHSABS 2.1 02/09/2016 1059   LYMPHSABS 2.1 01/02/2015 0825   MONOABS 0.9 01/02/2015 0825   EOSABS 0.2 02/09/2016 1059   BASOSABS 0.1 02/09/2016 1059   BASOSABS 0.2 (H) 01/02/2015 0825     Chemistry      Component Value Date/Time   NA 140 02/09/2016 1059   NA 141 12/20/2013 1004   K 4.0 02/09/2016 1059   K 4.1 12/20/2013 1004   CL 100 02/09/2016 1059   CL 107 02/16/2012 1436   CO2 27 02/09/2016 1059   CO2 28 12/20/2013 1004   BUN 10 02/09/2016 1059   BUN 6.9 (L) 12/20/2013 1004   CREATININE 0.64 02/09/2016 1059   CREATININE 0.7 12/20/2013 1004      Component Value Date/Time   CALCIUM 9.1 02/09/2016 1059   CALCIUM 9.3 12/20/2013 1004   ALKPHOS 77 02/09/2016 1059   ALKPHOS 73 12/20/2013 1004   AST 18 02/09/2016 1059   AST 19 12/20/2013 1004   ALT 11 02/09/2016 1059   ALT 12 12/20/2013 1004   BILITOT 0.3 02/09/2016 1059   BILITOT 0.39 12/20/2013 1004       Radiological Studies: No results found.  Impression:  #1. Chronic ITP Ongoing response to oral Promacta. She has been on a thrombopoietin agonist now for almost 9 years. There are recent data in patients who have taken agonists for over a year. About a third of patients remain in remission when the drugs are stopped. It is not clear why. The agents may be reset the bone marrow " thrombostat" or this may just be a reflection of the fact that some people do get spontaneous remissions with ITP. I would like to give her a trial off the drug. I had her stop last week in anticipation of today's visit. We will need to monitor her counts closely over the next few weeks. If we don't see a sustained response of treatment, we will resume.  #2. 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 She  received booster doses of Pneumovax 23 vaccine and meningococcus vaccine on 02/25/2015. I sent a prescription for amoxicillin 500 mg take 4 capsules at onset of any fever 101 or above or shaking chills then call M.D.  #3. Stage I, grade 2, Oncotype DX 28(intermediate risk), ER/PR positive, HER-2 equivocal treated with lumpectomy, breast radiation, and 5 years of tamoxifen hormonal therapy. No clinical or mammographic evidence for recurrence at this time.  #4. Chronic anxiety and depression This has resolved with stabilization of her medical condition and change in work environment. She continues on Celexa. She is not sure how well it is working but I encouraged her to stay on it. 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  CC: Patient Care Team: Lysle Pearl, MD as PCP - General (Family Medicine)   Annia Belt, MD 2/12/20183:08 PM

## 2016-02-17 ENCOUNTER — Telehealth: Payer: Self-pay | Admitting: *Deleted

## 2016-02-17 LAB — CBC WITH DIFFERENTIAL/PLATELET
Basophils Absolute: 0.1 10*3/uL (ref 0.0–0.2)
Basos: 2 %
EOS (ABSOLUTE): 0.1 10*3/uL (ref 0.0–0.4)
Eos: 2 %
Hematocrit: 42.6 % (ref 34.0–46.6)
Hemoglobin: 14.3 g/dL (ref 11.1–15.9)
Immature Grans (Abs): 0 10*3/uL (ref 0.0–0.1)
Immature Granulocytes: 0 %
Lymphocytes Absolute: 2.2 10*3/uL (ref 0.7–3.1)
Lymphs: 31 %
MCH: 29.8 pg (ref 26.6–33.0)
MCHC: 33.6 g/dL (ref 31.5–35.7)
MCV: 89 fL (ref 79–97)
Monocytes Absolute: 0.8 10*3/uL (ref 0.1–0.9)
Monocytes: 11 %
Neutrophils Absolute: 3.8 10*3/uL (ref 1.4–7.0)
Neutrophils: 54 %
Platelets: 92 10*3/uL — CL (ref 150–379)
RBC: 4.8 x10E6/uL (ref 3.77–5.28)
RDW: 15 % (ref 12.3–15.4)
WBC: 7.1 10*3/uL (ref 3.4–10.8)

## 2016-02-17 NOTE — Telephone Encounter (Signed)
Pt called - no answer; left message "platelets 92,000. Let's see how count is next week then decide whether she has to go back on the Promacta "  Per Dr Beryle Beams. And call for any questions.

## 2016-02-17 NOTE — Telephone Encounter (Signed)
-----   Message from Annia Belt, MD sent at 02/17/2016 12:55 PM EST ----- Call pt: platelets 92,000. Let's see how count is next week then decide whether she has to go back on the [facility]

## 2016-02-23 ENCOUNTER — Other Ambulatory Visit (INDEPENDENT_AMBULATORY_CARE_PROVIDER_SITE_OTHER): Payer: BLUE CROSS/BLUE SHIELD

## 2016-02-23 DIAGNOSIS — D693 Immune thrombocytopenic purpura: Secondary | ICD-10-CM

## 2016-02-24 LAB — CBC WITH DIFFERENTIAL/PLATELET
Basophils Absolute: 0.1 10*3/uL (ref 0.0–0.2)
Basos: 1 %
EOS (ABSOLUTE): 0.1 10*3/uL (ref 0.0–0.4)
Eos: 2 %
Hematocrit: 41.6 % (ref 34.0–46.6)
Hemoglobin: 13.1 g/dL (ref 11.1–15.9)
Immature Grans (Abs): 0 10*3/uL (ref 0.0–0.1)
Immature Granulocytes: 0 %
Lymphocytes Absolute: 2.2 10*3/uL (ref 0.7–3.1)
Lymphs: 31 %
MCH: 28.7 pg (ref 26.6–33.0)
MCHC: 31.5 g/dL (ref 31.5–35.7)
MCV: 91 fL (ref 79–97)
Monocytes Absolute: 0.7 10*3/uL (ref 0.1–0.9)
Monocytes: 10 %
Neutrophils Absolute: 3.9 10*3/uL (ref 1.4–7.0)
Neutrophils: 56 %
Platelets: 67 10*3/uL — CL (ref 150–379)
RBC: 4.57 x10E6/uL (ref 3.77–5.28)
RDW: 14.9 % (ref 12.3–15.4)
WBC: 7.1 10*3/uL (ref 3.4–10.8)

## 2016-02-26 ENCOUNTER — Telehealth: Payer: Self-pay | Admitting: *Deleted

## 2016-02-26 NOTE — Telephone Encounter (Signed)
-----   Message from Annia Belt, MD sent at 02/24/2016  3:42 PM EST ----- Call pt: platelets 67,000. Repeat again in one week. As long as she can stay above 30,000 we can hold off on Promacta

## 2016-02-26 NOTE — Telephone Encounter (Signed)
Called pt - no answer; left message "platelets 67,000. Repeat again in one week. As long as she can stay above 30,000 we can hold off on Promacta " per Dr Beryle Beams. Has lab appt on 2/26. And call for any questions.

## 2016-02-27 ENCOUNTER — Telehealth: Payer: Self-pay | Admitting: Family Medicine

## 2016-02-27 NOTE — Telephone Encounter (Signed)
APT. REMINDER CALL, LMTCB °

## 2016-03-01 ENCOUNTER — Other Ambulatory Visit (INDEPENDENT_AMBULATORY_CARE_PROVIDER_SITE_OTHER): Payer: BLUE CROSS/BLUE SHIELD

## 2016-03-01 DIAGNOSIS — D693 Immune thrombocytopenic purpura: Secondary | ICD-10-CM | POA: Diagnosis not present

## 2016-03-02 LAB — CBC WITH DIFFERENTIAL/PLATELET
Basophils Absolute: 0.1 10*3/uL (ref 0.0–0.2)
Basos: 2 %
EOS (ABSOLUTE): 0.1 10*3/uL (ref 0.0–0.4)
Eos: 2 %
Hematocrit: 41.5 % (ref 34.0–46.6)
Hemoglobin: 13.8 g/dL (ref 11.1–15.9)
Immature Grans (Abs): 0 10*3/uL (ref 0.0–0.1)
Immature Granulocytes: 0 %
Lymphocytes Absolute: 1.8 10*3/uL (ref 0.7–3.1)
Lymphs: 27 %
MCH: 29.8 pg (ref 26.6–33.0)
MCHC: 33.3 g/dL (ref 31.5–35.7)
MCV: 90 fL (ref 79–97)
Monocytes Absolute: 0.9 10*3/uL (ref 0.1–0.9)
Monocytes: 13 %
Neutrophils Absolute: 3.8 10*3/uL (ref 1.4–7.0)
Neutrophils: 56 %
Platelets: 69 10*3/uL — CL (ref 150–379)
RBC: 4.63 x10E6/uL (ref 3.77–5.28)
RDW: 14.7 % (ref 12.3–15.4)
WBC: 6.7 10*3/uL (ref 3.4–10.8)

## 2016-03-03 ENCOUNTER — Telehealth: Payer: Self-pay | Admitting: *Deleted

## 2016-03-03 NOTE — Telephone Encounter (Signed)
Pt also informed "Not a good idea" per Dr Beryle Beams to visit her parents. Stated she went by briefly to cook them something to eat. But stated she will stay away for a few more days.

## 2016-03-03 NOTE — Telephone Encounter (Signed)
Pt called / informed "platelet count holding at 69,000. Repeat 1 week - if good we can space out counts " per Dr Beryle Beams. Pt already has an appt scheduled 3/5 for labs.

## 2016-03-03 NOTE — Telephone Encounter (Addendum)
-----   Message from Annia Belt, MD sent at 03/01/2016  6:40 PM EST ----- Not a good idea! ----- Message ----- From: Ebbie Latus, RN Sent: 03/01/2016   1:21 PM To: Ebbie Latus, RN, Annia Belt, MD   I will snd pt's request to Dr Darnell Level. ----- Message ----- From: Meriam Sprague Sent: 03/01/2016  10:18 AM To: Ebbie Latus, RN  Patient wanted some advice.  Parents recently had flu and she wanted to go to their house and clean.  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  Told her I would pass the message.  Please call the patient.  Thanks,  [employer]

## 2016-03-03 NOTE — Telephone Encounter (Signed)
-----   Message from Yvonne Belt, MD sent at 03/02/2016  6:46 AM EST ----- Call pt: platelet count holding at 69,000. Repeat 1 week - if good we can space out counts

## 2016-03-03 NOTE — Telephone Encounter (Deleted)
-----   Message from Ebbie Latus, RN sent at 03/01/2016  1:21 PM EST -----  I will send pt's request to Dr Darnell Level. ----- Message ----- From: Meriam Sprague Sent: 03/01/2016  10:18 AM To: Ebbie Latus, RN  Patient wanted some advice.  Parents recently had flu and she wanted to go to their house and clean.  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  Told her I would pass the message.  Please call the patient.  Thanks,  [employer]

## 2016-03-05 ENCOUNTER — Telehealth: Payer: Self-pay | Admitting: Family Medicine

## 2016-03-05 NOTE — Telephone Encounter (Signed)
APT. REMINDER CALL, LMTCB °

## 2016-03-08 ENCOUNTER — Other Ambulatory Visit (INDEPENDENT_AMBULATORY_CARE_PROVIDER_SITE_OTHER): Payer: BLUE CROSS/BLUE SHIELD

## 2016-03-08 DIAGNOSIS — D693 Immune thrombocytopenic purpura: Secondary | ICD-10-CM | POA: Diagnosis not present

## 2016-03-09 LAB — CBC WITH DIFFERENTIAL/PLATELET
Basophils Absolute: 0.1 10*3/uL (ref 0.0–0.2)
Basos: 2 %
EOS (ABSOLUTE): 0.1 10*3/uL (ref 0.0–0.4)
Eos: 2 %
Hematocrit: 41.7 % (ref 34.0–46.6)
Hemoglobin: 13.6 g/dL (ref 11.1–15.9)
Immature Grans (Abs): 0 10*3/uL (ref 0.0–0.1)
Immature Granulocytes: 0 %
Lymphocytes Absolute: 2.7 10*3/uL (ref 0.7–3.1)
Lymphs: 37 %
MCH: 29.6 pg (ref 26.6–33.0)
MCHC: 32.6 g/dL (ref 31.5–35.7)
MCV: 91 fL (ref 79–97)
Monocytes Absolute: 0.7 10*3/uL (ref 0.1–0.9)
Monocytes: 10 %
Neutrophils Absolute: 3.8 10*3/uL (ref 1.4–7.0)
Neutrophils: 49 %
Platelets: 72 10*3/uL — CL (ref 150–379)
RBC: 4.6 x10E6/uL (ref 3.77–5.28)
RDW: 14.7 % (ref 12.3–15.4)
WBC: 7.5 10*3/uL (ref 3.4–10.8)

## 2016-03-11 ENCOUNTER — Telehealth: Payer: Self-pay | Admitting: *Deleted

## 2016-03-11 NOTE — Telephone Encounter (Addendum)
Pt called / informed "platelet count holding at 72,000! Let's check again in 2 weeks then change to monthly if stable" per Dr Beryle Beams. Lab appt scheduled for 3/19@ 1000 AM.

## 2016-03-11 NOTE — Telephone Encounter (Signed)
-----   Message from Annia Belt, MD sent at 03/09/2016  6:00 PM EST ----- Call pt: platelet count holding at 72,000! Let's check again in 2 weeks then change to monthly if stable

## 2016-03-15 ENCOUNTER — Other Ambulatory Visit: Payer: BLUE CROSS/BLUE SHIELD

## 2016-03-19 ENCOUNTER — Telehealth: Payer: Self-pay | Admitting: Family Medicine

## 2016-03-19 NOTE — Telephone Encounter (Signed)
APT. REMINDER CALL, LMTCB °

## 2016-03-22 ENCOUNTER — Other Ambulatory Visit: Payer: BLUE CROSS/BLUE SHIELD

## 2016-03-23 ENCOUNTER — Other Ambulatory Visit (INDEPENDENT_AMBULATORY_CARE_PROVIDER_SITE_OTHER): Payer: BLUE CROSS/BLUE SHIELD

## 2016-03-23 DIAGNOSIS — D693 Immune thrombocytopenic purpura: Secondary | ICD-10-CM

## 2016-03-23 LAB — CBC WITH DIFFERENTIAL/PLATELET
Basophils Absolute: 0.1 10*3/uL (ref 0.0–0.1)
Basophils Relative: 2 %
Eosinophils Absolute: 0.1 10*3/uL (ref 0.0–0.7)
Eosinophils Relative: 2 %
HCT: 42.6 % (ref 36.0–46.0)
Hemoglobin: 14.3 g/dL (ref 12.0–15.0)
Lymphocytes Relative: 33 %
Lymphs Abs: 2.1 10*3/uL (ref 0.7–4.0)
MCH: 30.2 pg (ref 26.0–34.0)
MCHC: 33.6 g/dL (ref 30.0–36.0)
MCV: 90.1 fL (ref 78.0–100.0)
Monocytes Absolute: 0.7 10*3/uL (ref 0.1–1.0)
Monocytes Relative: 11 %
Neutro Abs: 3.3 10*3/uL (ref 1.7–7.7)
Neutrophils Relative %: 52 %
Platelets: 54 10*3/uL — ABNORMAL LOW (ref 150–400)
RBC: 4.73 MIL/uL (ref 3.87–5.11)
RDW: 14.4 % (ref 11.5–15.5)
WBC: 6.3 10*3/uL (ref 4.0–10.5)

## 2016-03-23 NOTE — Progress Notes (Signed)
56 y.o. [id number] Divorced  Caucasian Fe here for annual exam. Menopausal no vaginal bleeding!! or vaginal dryness. Now off medication for ITTP, last platelet 60,000, (staying stable) still followed by hematology. No partner change, no STD screening noted. Sees PCP for depression medication, labs and aex. Best year ever !   No other health issues today.  Patient's last menstrual period was 06/05/2014.          Sexually active: Yes.    The current method of family planning is post menopausal status.    Exercising: Yes.    walk, run, throw ball Smoker:  no  Health Maintenance: Pap:  03-18-14 neg HPV HR neg, 03-19-15 neg MMG:  08-18-15 category b density birads 1:neg Colonoscopy:  2017 f/u 67yr BMD:   none TDaP:  2015 Shingles: no Pneumonia: 2017 Hep C and HIV: both neg per patient Labs: pcp Self breast exam: done monthly   reports that she quit smoking about 3 years ago. She has never used smokeless tobacco. She reports that she drinks alcohol. She reports that she does not use drugs.  Past Medical History:  Diagnosis Date  . Abnormal uterine bleeding   . Alcohol abuse, in remission   . Bronchiolitis   [place]  . Cellulitis   . Depression   . Depression with anxiety 11/16/2010  . Fibroid 1992  . ITP (idiopathic thrombocytopenic purpura)   . STD (sexually transmitted disease)    positive HPV    Past Surgical History:  Procedure Laterality Date  . BONE MARROW BIOPSY  09/2000   back hip  . BREAST SURGERY  01/2000    left breast mass/ benign  . ear bone surgery    . ENDOMETRIAL BIOPSY      Current Outpatient Prescriptions  Medication Sig Dispense Refill  . citalopram (CELEXA) 40 MG tablet Take by mouth Daily.      No current facility-administered medications for this visit.     Family History  Problem Relation Age of Onset  . Hypertension Mother   . Thyroid disease Mother   . Irregular heart beat Father   . Breast cancer Maternal Aunt   .  Brain cancer Maternal Aunt   . Tuberculosis Maternal Grandmother   . Diabetes Paternal Grandmother   . Cancer Maternal Grandfather     lung  . Uterine cancer Maternal Aunt     ROS:  Pertinent items are noted in HPI.  Otherwise, a comprehensive ROS was negative.  Exam:   BP 120/80   Pulse 70   Resp 16   Ht 5' 6.75" (1.695 m)   Wt 150 lb (68 kg)   LMP 06/05/2014 Comment: postmenopausal bleeding in 06/05/14  BMI 23.67 kg/m  Height: 5' 6.75" (169.5 cm) Ht Readings from Last 3 Encounters:  03/24/16 5' 6.75" (1.695 m)  02/16/16 5' 8" (1.727 m)  08/18/15 5' 7" (1.702 m)    General appearance: alert, cooperative and appears stated age Head: Normocephalic, without obvious abnormality, atraumatic Neck: no adenopathy, supple, symmetrical, trachea midline and thyroid normal to inspection and palpation Lungs: clear to auscultation bilaterally Breasts: normal appearance, no masses or tenderness, No nipple retraction or dimpling, No nipple discharge or bleeding, No axillary or supraclavicular adenopathy Heart: regular rate and rhythm Abdomen: soft, non-tender; no masses,  no organomegaly Extremities: extremities normal, atraumatic, no cyanosis or edema Skin: Skin color, texture, turgor normal. No rashes or lesions Lymph nodes: Cervical, supraclavicular, and axillary nodes normal. No abnormal inguinal nodes  palpated Neurologic: Grossly normal   Pelvic: External genitalia:  no lesions              Urethra:  normal appearing urethra with no masses, tenderness or lesions              Bartholin's and Skene's: normal                 Vagina: normal appearing vagina with normal color and discharge, no lesions              Cervix: multiparous appearance, no cervical motion tenderness and no lesions              Pap taken: Yes.   Bimanual Exam:  Uterus:  normal size, contour, position, consistency, mobility, non-tender              Adnexa: normal adnexa and no mass, fullness, tenderness                Rectovaginal: Confirms               Anus:  normal sphincter tone, no lesions  Chaperone present: yes  A:  Well Woman with normal exam  Menopausal no HRT.  ITP under hematology follow up/ MD with depression  History of right breast cancer 2002  P:   Reviewed health and wellness pertinent to exam  Aware of need to evaluate if vaginal bleeding  Continue follow up with MD as indicated  Pap smear as above not taken  Aware of importance of SBE and mammogram yearly  counseled on breast self exam, mammography screening, menopause, adequate intake of calcium and vitamin D, diet and exercise  return annually or prn  An After Visit Summary was printed and given to the patient.

## 2016-03-24 ENCOUNTER — Encounter: Payer: Self-pay | Admitting: Certified Nurse Midwife

## 2016-03-24 ENCOUNTER — Ambulatory Visit (INDEPENDENT_AMBULATORY_CARE_PROVIDER_SITE_OTHER): Payer: BLUE CROSS/BLUE SHIELD | Admitting: Certified Nurse Midwife

## 2016-03-24 VITALS — BP 120/80 | HR 70 | Resp 16 | Ht 66.75 in | Wt 150.0 lb

## 2016-03-24 DIAGNOSIS — Z01419 Encounter for gynecological examination (general) (routine) without abnormal findings: Secondary | ICD-10-CM

## 2016-03-24 NOTE — Patient Instructions (Signed)

## 2016-03-25 ENCOUNTER — Telehealth: Payer: Self-pay | Admitting: *Deleted

## 2016-03-25 NOTE — Telephone Encounter (Signed)
-----   Message from Annia Belt, MD sent at 03/23/2016  1:40 PM EDT ----- Call pt: platelets drifted down to 54,000. Let's check again in 1 week

## 2016-03-25 NOTE — Telephone Encounter (Signed)
Pt called / informed "platelets drifted down to 54,000. Let's check again in 1 week" per Dr Beryle Beams. Lab appt scheduled Tuesday 3/27@ 0900 AM.

## 2016-03-28 NOTE — Progress Notes (Signed)
Encounter reviewed [name] Jertson, MD   

## 2016-03-29 ENCOUNTER — Telehealth: Payer: Self-pay | Admitting: Family Medicine

## 2016-03-29 NOTE — Telephone Encounter (Signed)
APT. REMINDER CALL, LMTCB °

## 2016-03-30 ENCOUNTER — Other Ambulatory Visit (INDEPENDENT_AMBULATORY_CARE_PROVIDER_SITE_OTHER): Payer: BLUE CROSS/BLUE SHIELD

## 2016-03-30 DIAGNOSIS — D693 Immune thrombocytopenic purpura: Secondary | ICD-10-CM | POA: Diagnosis not present

## 2016-03-30 LAB — CBC WITH DIFFERENTIAL/PLATELET
Basophils Absolute: 0.1 10*3/uL (ref 0.0–0.1)
Basophils Relative: 2 %
Eosinophils Absolute: 0.2 10*3/uL (ref 0.0–0.7)
Eosinophils Relative: 3 %
HCT: 41.3 % (ref 36.0–46.0)
Hemoglobin: 13.6 g/dL (ref 12.0–15.0)
Lymphocytes Relative: 31 %
Lymphs Abs: 1.9 10*3/uL (ref 0.7–4.0)
MCH: 29.7 pg (ref 26.0–34.0)
MCHC: 32.9 g/dL (ref 30.0–36.0)
MCV: 90.2 fL (ref 78.0–100.0)
Monocytes Absolute: 0.6 10*3/uL (ref 0.1–1.0)
Monocytes Relative: 9 %
Neutro Abs: 3.4 10*3/uL (ref 1.7–7.7)
Neutrophils Relative %: 55 %
Platelets: 63 10*3/uL — ABNORMAL LOW (ref 150–400)
RBC: 4.58 MIL/uL (ref 3.87–5.11)
RDW: 14.4 % (ref 11.5–15.5)
WBC: 6.2 10*3/uL (ref 4.0–10.5)

## 2016-04-01 ENCOUNTER — Telehealth: Payer: Self-pay | Admitting: *Deleted

## 2016-04-01 NOTE — Telephone Encounter (Signed)
-----   Message from Annia Belt, MD sent at 03/30/2016  3:35 PM EDT ----- caal pt: platelets bounced up a little to 63,000. Let's check again in 3 weeks

## 2016-04-01 NOTE — Telephone Encounter (Signed)
Pt called - no answer; left message"platelets bounced up a little to 63,000. Let's check again in 3 weeks" per Dr Beryle Beams. And to call back to schedule lab appt.

## 2016-04-05 NOTE — Telephone Encounter (Signed)
Called pt - lab appt scheduled 4/18 Wed at 0900 AM.

## 2016-04-20 ENCOUNTER — Telehealth: Payer: Self-pay | Admitting: Oncology

## 2016-04-20 NOTE — Telephone Encounter (Signed)
Calling to confirm appointment for 04/21/16 at 9:00 lmtcb

## 2016-04-21 ENCOUNTER — Other Ambulatory Visit (INDEPENDENT_AMBULATORY_CARE_PROVIDER_SITE_OTHER): Payer: BLUE CROSS/BLUE SHIELD

## 2016-04-21 DIAGNOSIS — D693 Immune thrombocytopenic purpura: Secondary | ICD-10-CM

## 2016-04-21 LAB — CBC WITH DIFFERENTIAL/PLATELET
Basophils Absolute: 0.1 10*3/uL (ref 0.0–0.1)
Basophils Relative: 2 %
Eosinophils Absolute: 0.1 10*3/uL (ref 0.0–0.7)
Eosinophils Relative: 2 %
HCT: 42.4 % (ref 36.0–46.0)
Hemoglobin: 14.1 g/dL (ref 12.0–15.0)
Lymphocytes Relative: 33 %
Lymphs Abs: 2.3 10*3/uL (ref 0.7–4.0)
MCH: 30.1 pg (ref 26.0–34.0)
MCHC: 33.3 g/dL (ref 30.0–36.0)
MCV: 90.4 fL (ref 78.0–100.0)
Monocytes Absolute: 0.6 10*3/uL (ref 0.1–1.0)
Monocytes Relative: 8 %
Neutro Abs: 3.7 10*3/uL (ref 1.7–7.7)
Neutrophils Relative %: 55 %
Platelets: 66 10*3/uL — ABNORMAL LOW (ref 150–400)
RBC: 4.69 MIL/uL (ref 3.87–5.11)
RDW: 14.6 % (ref 11.5–15.5)
WBC: 6.8 10*3/uL (ref 4.0–10.5)

## 2016-04-26 ENCOUNTER — Telehealth: Payer: Self-pay | Admitting: *Deleted

## 2016-04-26 NOTE — Telephone Encounter (Signed)
-----   Message from Annia Belt, MD sent at 04/21/2016 11:41 AM EDT ----- Call pt: platelets holding @ 66,000!  Check again in 1 month

## 2016-04-26 NOTE — Telephone Encounter (Signed)
Pt called  - no answer; left message "platelets holding @ 66,000! Check again in 1 month" per Dr Beryle Beams. Call for any questions and call to schedule lab appt.

## 2016-05-03 ENCOUNTER — Telehealth: Payer: Self-pay | Admitting: *Deleted

## 2016-05-03 NOTE — Telephone Encounter (Signed)
Lab appt scheduled for May 18 @ 1000 AM.

## 2016-05-21 ENCOUNTER — Other Ambulatory Visit (INDEPENDENT_AMBULATORY_CARE_PROVIDER_SITE_OTHER): Payer: BLUE CROSS/BLUE SHIELD

## 2016-05-21 DIAGNOSIS — Z853 Personal history of malignant neoplasm of breast: Secondary | ICD-10-CM

## 2016-05-21 DIAGNOSIS — D693 Immune thrombocytopenic purpura: Secondary | ICD-10-CM

## 2016-05-22 LAB — CBC WITH DIFFERENTIAL/PLATELET
Basophils Absolute: 0.1 10*3/uL (ref 0.0–0.2)
Basos: 2 %
EOS (ABSOLUTE): 0.2 10*3/uL (ref 0.0–0.4)
Eos: 2 %
Hematocrit: 42.4 % (ref 34.0–46.6)
Hemoglobin: 13.9 g/dL (ref 11.1–15.9)
Immature Grans (Abs): 0 10*3/uL (ref 0.0–0.1)
Immature Granulocytes: 0 %
Lymphocytes Absolute: 2.1 10*3/uL (ref 0.7–3.1)
Lymphs: 32 %
MCH: 30.2 pg (ref 26.6–33.0)
MCHC: 32.8 g/dL (ref 31.5–35.7)
MCV: 92 fL (ref 79–97)
Monocytes Absolute: 0.7 10*3/uL (ref 0.1–0.9)
Monocytes: 11 %
Neutrophils Absolute: 3.6 10*3/uL (ref 1.4–7.0)
Neutrophils: 53 %
Platelets: 51 10*3/uL — CL (ref 150–379)
RBC: 4.6 x10E6/uL (ref 3.77–5.28)
RDW: 14.4 % (ref 12.3–15.4)
WBC: 6.7 10*3/uL (ref 3.4–10.8)

## 2016-05-22 LAB — COMPREHENSIVE METABOLIC PANEL
ALT: 9 IU/L (ref 0–32)
AST: 17 IU/L (ref 0–40)
Albumin/Globulin Ratio: 1.5 (ref 1.2–2.2)
Albumin: 3.8 g/dL (ref 3.5–5.5)
Alkaline Phosphatase: 73 IU/L (ref 39–117)
BUN/Creatinine Ratio: 11 (ref 9–23)
BUN: 7 mg/dL (ref 6–24)
Bilirubin Total: 0.5 mg/dL (ref 0.0–1.2)
CO2: 25 mmol/L (ref 18–29)
Calcium: 8.9 mg/dL (ref 8.7–10.2)
Chloride: 105 mmol/L (ref 96–106)
Creatinine, Ser: 0.64 mg/dL (ref 0.57–1.00)
GFR calc Af Amer: 115 mL/min/{1.73_m2} (ref >59)
GFR calc non Af Amer: 99 mL/min/{1.73_m2} (ref >59)
Globulin, Total: 2.6 g/dL (ref 1.5–4.5)
Glucose: 84 mg/dL (ref 65–99)
Potassium: 4.2 mmol/L (ref 3.5–5.2)
Sodium: 144 mmol/L (ref 134–144)
Total Protein: 6.4 g/dL (ref 6.0–8.5)

## 2016-05-24 ENCOUNTER — Telehealth: Payer: Self-pay | Admitting: *Deleted

## 2016-05-24 NOTE — Telephone Encounter (Signed)
-----   Message from Annia Belt, MD sent at 05/22/2016  3:05 PM EDT ----- Call pt: platelets holding at 51,000. Repeat in 1 month

## 2016-05-24 NOTE — Telephone Encounter (Signed)
Called pt - no answer and no VM. I will call back later.

## 2016-05-24 NOTE — Telephone Encounter (Signed)
Call from pt- informed "platelets holding at 51,000. Repeat in 1 month" per Dr Beryle Beams. Asked about other results - informed rest of CBC/CMET within nl limits. Lab appt scheduled on June 18.

## 2016-06-04 ENCOUNTER — Emergency Department (HOSPITAL_COMMUNITY): Payer: BLUE CROSS/BLUE SHIELD

## 2016-06-04 ENCOUNTER — Encounter (HOSPITAL_COMMUNITY): Payer: Self-pay | Admitting: Nurse Practitioner

## 2016-06-04 ENCOUNTER — Inpatient Hospital Stay (HOSPITAL_COMMUNITY)
Admission: EM | Admit: 2016-06-04 | Discharge: 2016-06-06 | DRG: 392 | Disposition: A | Discharge status: Home / Self Care | Payer: BLUE CROSS/BLUE SHIELD | Attending: Internal Medicine | Admitting: Internal Medicine

## 2016-06-04 DIAGNOSIS — K529 Noninfective gastroenteritis and colitis, unspecified: Secondary | ICD-10-CM | POA: Diagnosis not present

## 2016-06-04 DIAGNOSIS — R112 Nausea with vomiting, unspecified: Secondary | ICD-10-CM | POA: Insufficient documentation

## 2016-06-04 DIAGNOSIS — Z87891 Personal history of nicotine dependence: Secondary | ICD-10-CM

## 2016-06-04 DIAGNOSIS — Z803 Family history of malignant neoplasm of breast: Secondary | ICD-10-CM

## 2016-06-04 DIAGNOSIS — F1011 Alcohol abuse, in remission: Secondary | ICD-10-CM | POA: Diagnosis present

## 2016-06-04 DIAGNOSIS — Z9081 Acquired absence of spleen: Secondary | ICD-10-CM

## 2016-06-04 DIAGNOSIS — R030 Elevated blood-pressure reading, without diagnosis of hypertension: Secondary | ICD-10-CM | POA: Diagnosis present

## 2016-06-04 DIAGNOSIS — F418 Other specified anxiety disorders: Secondary | ICD-10-CM | POA: Diagnosis not present

## 2016-06-04 DIAGNOSIS — B9620 Unspecified Escherichia coli [E. coli] as the cause of diseases classified elsewhere: Secondary | ICD-10-CM | POA: Diagnosis present

## 2016-06-04 DIAGNOSIS — Z8249 Family history of ischemic heart disease and other diseases of the circulatory system: Secondary | ICD-10-CM

## 2016-06-04 DIAGNOSIS — Z923 Personal history of irradiation: Secondary | ICD-10-CM

## 2016-06-04 DIAGNOSIS — R197 Diarrhea, unspecified: Secondary | ICD-10-CM

## 2016-06-04 DIAGNOSIS — D693 Immune thrombocytopenic purpura: Secondary | ICD-10-CM | POA: Diagnosis present

## 2016-06-04 DIAGNOSIS — A044 Other intestinal Escherichia coli infections: Secondary | ICD-10-CM

## 2016-06-04 DIAGNOSIS — Z79899 Other long term (current) drug therapy: Secondary | ICD-10-CM

## 2016-06-04 DIAGNOSIS — Z853 Personal history of malignant neoplasm of breast: Secondary | ICD-10-CM

## 2016-06-04 LAB — URINALYSIS, ROUTINE W REFLEX MICROSCOPIC
Bilirubin Urine: NEGATIVE
Glucose, UA: NEGATIVE mg/dL
Hgb urine dipstick: NEGATIVE
Ketones, ur: 80 mg/dL — AB
Nitrite: NEGATIVE
Protein, ur: NEGATIVE mg/dL
Specific Gravity, Urine: 1.021 (ref 1.005–1.030)
pH: 6 (ref 5.0–8.0)

## 2016-06-04 LAB — COMPREHENSIVE METABOLIC PANEL
ALT: 15 U/L (ref 14–54)
AST: 26 U/L (ref 15–41)
Albumin: 4.3 g/dL (ref 3.5–5.0)
Alkaline Phosphatase: 69 U/L (ref 38–126)
Anion gap: 11 (ref 5–15)
BUN: 12 mg/dL (ref 6–20)
CO2: 25 mmol/L (ref 22–32)
Calcium: 9.6 mg/dL (ref 8.9–10.3)
Chloride: 102 mmol/L (ref 101–111)
Creatinine, Ser: 0.72 mg/dL (ref 0.44–1.00)
GFR calc Af Amer: >60 mL/min (ref >60)
GFR calc non Af Amer: >60 mL/min (ref >60)
Glucose, Bld: 143 mg/dL — ABNORMAL HIGH (ref 65–99)
Potassium: 3.5 mmol/L (ref 3.5–5.1)
Sodium: 138 mmol/L (ref 135–145)
Total Bilirubin: 0.7 mg/dL (ref 0.3–1.2)
Total Protein: 7.7 g/dL (ref 6.5–8.1)

## 2016-06-04 LAB — CBC WITH DIFFERENTIAL/PLATELET
Basophils Absolute: 0 10*3/uL (ref 0.0–0.1)
Basophils Relative: 0 %
Eosinophils Absolute: 0 10*3/uL (ref 0.0–0.7)
Eosinophils Relative: 0 %
HCT: 44 % (ref 36.0–46.0)
Hemoglobin: 15.1 g/dL — ABNORMAL HIGH (ref 12.0–15.0)
Lymphocytes Relative: 8 %
Lymphs Abs: 1.4 10*3/uL (ref 0.7–4.0)
MCH: 30.3 pg (ref 26.0–34.0)
MCHC: 34.3 g/dL (ref 30.0–36.0)
MCV: 88.4 fL (ref 78.0–100.0)
Monocytes Absolute: 0.7 10*3/uL (ref 0.1–1.0)
Monocytes Relative: 4 %
Neutro Abs: 15.3 10*3/uL — ABNORMAL HIGH (ref 1.7–7.7)
Neutrophils Relative %: 88 %
Platelets: 34 10*3/uL — ABNORMAL LOW (ref 150–400)
RBC: 4.98 MIL/uL (ref 3.87–5.11)
RDW: 13.7 % (ref 11.5–15.5)
WBC: 17.4 10*3/uL — ABNORMAL HIGH (ref 4.0–10.5)

## 2016-06-04 LAB — LIPASE, BLOOD: Lipase: 31 U/L (ref 11–51)

## 2016-06-04 MED ORDER — METRONIDAZOLE IN NACL 5-0.79 MG/ML-% IV SOLN
500.0000 mg | Freq: Three times a day (TID) | INTRAVENOUS | Status: DC
  Administered 2016-06-05 – 2016-06-06 (×4): 500 mg via INTRAVENOUS
  Filled 2016-06-04 (×4): qty 100

## 2016-06-04 MED ORDER — SODIUM CHLORIDE 0.9 % IV SOLN
INTRAVENOUS | Status: DC
  Administered 2016-06-04: 500 mL via INTRAVENOUS
  Administered 2016-06-05 – 2016-06-06 (×2): via INTRAVENOUS

## 2016-06-04 MED ORDER — DICYCLOMINE HCL 10 MG/ML IM SOLN
20.0000 mg | Freq: Once | INTRAMUSCULAR | Status: AC
  Administered 2016-06-04: 20 mg via INTRAMUSCULAR
  Filled 2016-06-04: qty 2

## 2016-06-04 MED ORDER — BISMUTH SUBSALICYLATE 262 MG PO CHEW
524.0000 mg | CHEWABLE_TABLET | Freq: Every day | ORAL | Status: DC | PRN
  Filled 2016-06-04: qty 2

## 2016-06-04 MED ORDER — ONDANSETRON HCL 4 MG/2ML IJ SOLN
4.0000 mg | Freq: Once | INTRAMUSCULAR | Status: AC
  Administered 2016-06-04: 4 mg via INTRAVENOUS
  Filled 2016-06-04: qty 2

## 2016-06-04 MED ORDER — ONDANSETRON HCL 4 MG/2ML IJ SOLN: 4.0000 mg | Freq: Three times a day (TID) | INTRAMUSCULAR | Status: DC | PRN

## 2016-06-04 MED ORDER — SODIUM CHLORIDE 0.9 % IV BOLUS (SEPSIS): 1500.0000 mL | Freq: Once | INTRAVENOUS | Status: DC

## 2016-06-04 MED ORDER — OXYCODONE-ACETAMINOPHEN 5-325 MG PO TABS: 1.0000 | ORAL_TABLET | ORAL | Status: DC | PRN

## 2016-06-04 MED ORDER — CITALOPRAM HYDROBROMIDE 10 MG PO TABS
40.0000 mg | ORAL_TABLET | Freq: Every day | ORAL | Status: DC
  Administered 2016-06-05 – 2016-06-06 (×2): 40 mg via ORAL
  Filled 2016-06-04 (×2): qty 4

## 2016-06-04 MED ORDER — ACETAMINOPHEN 325 MG PO TABS
650.0000 mg | ORAL_TABLET | Freq: Four times a day (QID) | ORAL | Status: DC | PRN
  Administered 2016-06-05: 650 mg via ORAL
  Filled 2016-06-04: qty 2

## 2016-06-04 MED ORDER — SODIUM CHLORIDE 0.9 % IV BOLUS (SEPSIS)
1000.0000 mL | Freq: Once | INTRAVENOUS | Status: AC
  Administered 2016-06-04: 1000 mL via INTRAVENOUS

## 2016-06-04 MED ORDER — IOPAMIDOL (ISOVUE-300) INJECTION 61%
INTRAVENOUS | Status: AC
  Administered 2016-06-04: 20:00:00
  Filled 2016-06-04: qty 100

## 2016-06-04 MED ORDER — ACETAMINOPHEN 650 MG RE SUPP: 650.0000 mg | Freq: Four times a day (QID) | RECTAL | Status: DC | PRN

## 2016-06-04 MED ORDER — MORPHINE SULFATE (PF) 2 MG/ML IV SOLN: 2.0000 mg | INTRAVENOUS | Status: DC | PRN

## 2016-06-04 MED ORDER — ZOLPIDEM TARTRATE 5 MG PO TABS
5.0000 mg | ORAL_TABLET | Freq: Every evening | ORAL | Status: DC | PRN
  Administered 2016-06-05: 5 mg via ORAL
  Filled 2016-06-04: qty 1

## 2016-06-04 MED ORDER — CIPROFLOXACIN IN D5W 400 MG/200ML IV SOLN
400.0000 mg | Freq: Once | INTRAVENOUS | Status: AC
  Administered 2016-06-04: 400 mg via INTRAVENOUS
  Filled 2016-06-04: qty 200

## 2016-06-04 MED ORDER — METRONIDAZOLE IN NACL 5-0.79 MG/ML-% IV SOLN
500.0000 mg | Freq: Once | INTRAVENOUS | Status: AC
  Administered 2016-06-04: 500 mg via INTRAVENOUS
  Filled 2016-06-04: qty 100

## 2016-06-04 NOTE — Progress Notes (Signed)
Pharmacy Antibiotic Note  Yvonne Hoover is a 56 y.o. female c/o nausea/vomiting/diarrhea with LLQ abdominal pain admitted on 06/04/2016 with colitis.  Pharmacy has been consulted for cipro dosing.  Plan: Cipro 400 mg IV q12h Flagyl 500 mg IV q8h (MD) F/u scr/cultures     Temp (24hrs), Avg:97 F (36.1 C), Min:97 F (36.1 C), Max:97 F (36.1 C)   Recent Labs Lab 06/04/16 1816  WBC 17.4*  CREATININE 0.72    CrCl cannot be calculated (Unknown ideal weight.).    No Known Allergies  Antimicrobials this admission: 6/1 cipro >>  6/1 flagyl >>   Dose adjustments this admission:   Microbiology results:  BCx:   UCx:    Sputum:    MRSA PCR:   Thank you for allowing pharmacy to be a part of this patient's care.  Yvonne Hoover 06/04/2016 10:48 PM

## 2016-06-04 NOTE — H&P (Signed)
History and Physical    Yvonne Hoover BCW:[id number] DOB: 01-27-1960 DOA: 06/04/2016  Referring MD/NP/PA:   PCP: Lysle Pearl, MD   Patient coming from:  The patient is coming from home.  At baseline, pt is independent for most of ADL.   Chief Complaint: Nausea, vomiting, diarrhea, abdominal pain  HPI: Yvonne Hoover is a 56 y.o. female with medical history significant of IPT (s/p of splenectomy), depression, anxiety, breast cancer (s/p of lumpectomy, radiation therapy, no chemotherapy), alcohol abuse in remission, who presents with nausea, vomiting, diarrhea and abdominal pain.  Patient states that she has been having intermittent nausea, vomiting, diarrhea and abdominal pain for almost a month, which has worsened today. She vomited 6-7 times today without blood in vomitus. She has more than 10 times of small amount of loose stool bowel movements each day. No fever or chills. Her abdominal pain is located in the upper abdomen, intermittent, 10 out of 10 in severity at worst time, nonradiating. It is not aggravated or alleviated by any factors. Currently the abdominal pain is 4 out of 10 in severity. Patient denies chest pain, SOb, cough, symptoms of UTI. No bleeding tendency.  ED Course: pt was found to have WBC 17.4, lactate 0.9, urinalysis with trace amount of leukocyte, thrombocytopenia with platelet 34, electrolytes renal function okay, temperature normal, bradycardia, oxygen saturation 99% on room air, blood pressure 187/83. CT abdomen/pelvis showed colitis. Patient is placed on MedSurg bed for observation.   Review of Systems:   General: no fevers, chills, no changes in body weight, has poor appetite, has fatigue HEENT: no blurry vision, hearing changes or sore throat Respiratory: no dyspnea, coughing, wheezing CV: no chest pain, no palpitations GI: has nausea, vomiting, abdominal pain, diarrhea GU: no dysuria, burning on urination, increased urinary frequency, hematuria  Ext: no leg  edema Neuro: no unilateral weakness, numbness, or tingling, no vision change or hearing loss Skin: no rash, no skin tear. MSK: No muscle spasm, no deformity, no limitation of range of movement in spin Heme: No easy bruising.  Travel history: No recent long distant travel.  Allergy: No Known Allergies  Past Medical History:  Diagnosis Date  . Abnormal uterine bleeding   . Alcohol abuse, in remission   . Bronchiolitis   . Cancer ([place]) 07/2007   breast  . Cellulitis   . Depression   . Depression with anxiety 11/16/2010  . Fibroid 1992  . ITP (idiopathic thrombocytopenic purpura)    chronic  . STD (sexually transmitted disease)    positive HPV    Past Surgical History:  Procedure Laterality Date  . BONE MARROW BIOPSY  09/2000   back hip  . BREAST SURGERY  01/2000    left breast mass/ benign  . ear bone surgery    . ENDOMETRIAL BIOPSY      Social History:  reports that she quit smoking about 3 years ago. She has never used smokeless tobacco. She reports that she drinks alcohol. She reports that she does not use drugs.  Family History:  Family History  Problem Relation Age of Onset  . Hypertension Mother   . Thyroid disease Mother   . Irregular heart beat Father   . Breast cancer Maternal Aunt   . Brain cancer Maternal Aunt   . Tuberculosis Maternal Grandmother   . Diabetes Paternal Grandmother   . Cancer Maternal Grandfather        lung  . Uterine cancer Maternal Aunt      Prior to  Admission medications   Medication Sig Start Date End Date Taking? Authorizing Provider  bismuth subsalicylate (PEPTO BISMOL) 262 MG chewable tablet Chew 524 mg by mouth daily as needed for indigestion.   Yes [provider]  citalopram (CELEXA) 40 MG tablet TAKE ONE TABLET BY MOUTH DAILY. 05/18/16  Yes [provider]    Physical Exam: Vitals:   06/04/16 2010 06/04/16 2211 06/04/16 2317 06/05/16 0010  BP: (!) 166/78 (!) 171/75 (!) 174/79   Pulse: 61 64 (!) 59     Resp: '18 18 18   ' Temp:   98.9 F (37.2 C)   TempSrc:   Oral   SpO2: 99% 97% 100%   Weight:    67.6 kg (149 lb)   General: Not in acute distress HEENT:       Eyes: PERRL, EOMI, no scleral icterus.       ENT: No discharge from the ears and nose, no pharynx injection, no tonsillar enlargement.        Neck: No JVD, no bruit, no mass felt. Heme: No neck lymph node enlargement. Cardiac: S1/S2, RRR, No murmurs, No gallops or rubs. Respiratory: No rales, wheezing, rhonchi or rubs. GI: Soft, nondistended, has tenderness in upper abdomen, no rebound pain, no organomegaly, BS present. GU: No hematuria Ext: No pitting leg edema bilaterally. 2+DP/PT pulse bilaterally. Musculoskeletal: No joint deformities, No joint redness or warmth, no limitation of ROM in spin. Skin: No rashes.  Neuro: Alert, oriented X3, cranial nerves II-XII grossly intact, moves all extremities normally. Psych: Patient is not psychotic, no suicidal or hemocidal ideation.  Labs on Admission: I have personally reviewed following labs and imaging studies  CBC:  Recent Labs Lab 06/04/16 1816  WBC 17.4*  NEUTROABS 15.3*  HGB 15.1*  HCT 44.0  MCV 88.4  PLT 34*   Basic Metabolic Panel:  Recent Labs Lab 06/04/16 1816  NA 138  K 3.5  CL 102  CO2 25  GLUCOSE 143*  BUN 12  CREATININE 0.72  CALCIUM 9.6   GFR: Estimated Creatinine Clearance: 74.7 mL/min (by C-G formula based on SCr of 0.72 mg/dL). Liver Function Tests:  Recent Labs Lab 06/04/16 1816  AST 26  ALT 15  ALKPHOS 69  BILITOT 0.7  PROT 7.7  ALBUMIN 4.3    Recent Labs Lab 06/04/16 1816  LIPASE 31   No results for input(s): AMMONIA in the last 168 hours. Coagulation Profile: No results for input(s): INR, PROTIME in the last 168 hours. Cardiac Enzymes: No results for input(s): CKTOTAL, CKMB, CKMBINDEX, TROPONINI in the last 168 hours. BNP (last 3 results) No results for input(s): PROBNP in the last 8760 hours. HbA1C: No results for  input(s): HGBA1C in the last 72 hours. CBG: No results for input(s): GLUCAP in the last 168 hours. Lipid Profile: No results for input(s): CHOL, HDL, LDLCALC, TRIG, CHOLHDL, LDLDIRECT in the last 72 hours. Thyroid Function Tests: No results for input(s): TSH, T4TOTAL, FREET4, T3FREE, THYROIDAB in the last 72 hours. Anemia Panel: No results for input(s): VITAMINB12, FOLATE, FERRITIN, TIBC, IRON, RETICCTPCT in the last 72 hours. Urine analysis:    Component Value Date/Time   COLORURINE YELLOW 06/04/2016 1746   APPEARANCEUR HAZY (A) 06/04/2016 1746   LABSPEC 1.021 06/04/2016 1746   PHURINE 6.0 06/04/2016 1746   GLUCOSEU NEGATIVE 06/04/2016 1746   HGBUR NEGATIVE 06/04/2016 1746   BILIRUBINUR NEGATIVE 06/04/2016 1746   BILIRUBINUR n 03/18/2014 0857   KETONESUR 80 (A) 06/04/2016 1746   PROTEINUR NEGATIVE 06/04/2016 1746  UROBILINOGEN negative 03/18/2014 0857   UROBILINOGEN 0.2 10/27/2009 1145   NITRITE NEGATIVE 06/04/2016 1746   LEUKOCYTESUR TRACE (A) 06/04/2016 1746   Sepsis Labs: '@LABRCNTIP' (procalcitonin:4,lacticidven:4) )No results found for this or any previous visit (from the past 240 hour(s)).   Radiological Exams on Admission: Ct Abdomen Pelvis W Contrast  Result Date: 06/04/2016 CLINICAL DATA:  Left lower quadrant pain onset 3 hours prior to arrival. Nausea, vomiting and diarrhea. EXAM: CT ABDOMEN AND PELVIS WITH CONTRAST TECHNIQUE: Multidetector CT imaging of the abdomen and pelvis was performed using the standard protocol following bolus administration of intravenous contrast. CONTRAST:  100 cc Isovue-300 COMPARISON:  11/17/2009 FINDINGS: Lower chest: No acute abnormality. Hepatobiliary: No focal liver abnormality is seen. No gallstones, gallbladder wall thickening, or biliary dilatation. Pancreas: Unremarkable. No pancreatic ductal dilatation or surrounding inflammatory changes. Spleen: Splenectomy. Adrenals/Urinary Tract: Normal bilateral adrenal glands and kidneys without  obstructive uropathy. Unremarkable appearance of the bladder. Stomach/Bowel: Nondistended stomach. Normal small bowel rotation without bowel obstruction. Mildly thickened long segment of transverse colon consistent with colitis. Normal position of the transverse colon may be altered due to splenectomy. Normal-appearing appendix. Vascular/Lymphatic: No significant vascular findings are present. No enlarged abdominal or pelvic lymph nodes. Reproductive: Uterus and bilateral adnexa are unremarkable. Mild engorgement of periuterine vessels on the left may represent changes of pelvic vascular congestion. Other: No abdominal wall hernia or abnormality. No abdominopelvic ascites. Musculoskeletal: Degenerative disc disease L5-S1. No acute nor suspicious osseous lesions. IMPRESSION: 1. Segmental thickening of the transverse colon consistent with colitis. No bowel obstruction. 2. Status post splenectomy. 3. Mild prominence of left periuterine vessels can be seen with pelvic vascular congestion, a diagnosis of exclusion after other etiologies in the workup of pelvic pain have been excluded. Electronically Signed   By: Ashley Royalty M.D.   On: 06/04/2016 20:06    EKG:  Not done in ED, will get one.   Assessment/Plan Principal Problem:   Colitis Active Problems:   Immune thrombocytopenic purpura (HCC)   Depression with anxiety   Blood pressure elevated without history of HTN   Colitis: Patient has leukocytosis, but no fever, lactic acid is normal. Clinically nonseptic. Hemodynamically stable -will place on tele bed for obs -IV ciprofloxacin and Flagyl - follow-up blood culture - When necessary Zofran for nausea, Percocet and morphine for pain - c diff PCR, GI pathogen panel - IVF: 2.5 L of NS bolus and then 125 cc/h  Immune thrombocytopenic purpura ([place]): 03/11/1932, no bleeding tendency. -Follow-up by CBC  Depression with anxiety: -Celexa  Blood pressure elevated without history of HTN: Blood pressure  187/83. Patient does not have history of hypertension. -IV hydralazine when necessary  DVT ppx: SCD Code Status: Full code Family Communication: None at bed side.    Disposition Plan:  Anticipate discharge back to previous home environment Consults called:  none Admission status:  medical floor/obs    Date of Service 06/05/2016    Ivor Costa [facility] Pager [phone]  If 7PM-7AM, please contact night-coverage [email or web address] Password [email or web address] 06/05/2016, 1:21 AM

## 2016-06-04 NOTE — ED Triage Notes (Signed)
Pt c/o n/v/d with LLQ pain onset 3 hrs pta while outside doing yard work. States she has been evaluated by her PCP for similar problem without a definite diagnosis. Reports one episode of emesis.

## 2016-06-04 NOTE — ED Notes (Signed)
Bed: WA22 Expected date:  Expected time:  Means of arrival:  Comments: EMS abd. pain 

## 2016-06-04 NOTE — ED Provider Notes (Signed)
[facility] DEPT Provider Note   CSN: [id number] Arrival date & time: 06/04/16  1701     History   Chief Complaint Chief Complaint  Patient presents with  . N/V/D  . Abdominal Pain    HPI Yvonne Hoover is a 56 y.o. female.  HPI  Pt was seen at 1740.  Per pt, c/o gradual onset and persistence of constant LLQ abd "pain" that has been present intermittently x1 month, worse today PTA. Has been associated with multiple intermittent episodes of N/V/D.  Describes the abd pain as "cramping."  Pt states her symptoms began while she was working outside today. States she has been evaluated by her PMD previously for these complaints without definitive dx (stool studies, Hpylori test). Denies fevers, no back pain, no rash, no CP/SOB, no black or blood in stools or emesis.      Past Medical History:  Diagnosis Date  . Abnormal uterine bleeding   . Alcohol abuse, in remission   . Bronchiolitis   . Cancer ([place]) 07/2007   breast  . Cellulitis   . Depression   . Depression with anxiety 11/16/2010  . Fibroid 1992  . ITP (idiopathic thrombocytopenic purpura)   . STD (sexually transmitted disease)    positive HPV    Patient Active Problem List   Diagnosis Date Noted  . Postmenopausal bleeding 05/12/2014  . Fibroids, intramural 05/12/2014  . Fibroids, subserous 05/12/2014  . Fibroids, submucosal 02/21/2013  . History of breast cancer in female 11/16/2010  . Depression with anxiety 11/16/2010  . Immune thrombocytopenic purpura ([place]) 11/12/2010    Past Surgical History:  Procedure Laterality Date  . BONE MARROW BIOPSY  09/2000   back hip  . BREAST SURGERY  01/2000    left breast mass/ benign  . ear bone surgery    . ENDOMETRIAL BIOPSY      OB History    Gravida Para Term Preterm AB Living   '2 2 2     2   ' SAB TAB Ectopic Multiple Live Births           2       Home Medications    Prior to Admission medications   Medication Sig Start Date End Date Taking? Authorizing  Provider  bismuth subsalicylate (PEPTO BISMOL) 262 MG chewable tablet Chew 524 mg by mouth daily as needed for indigestion.   Yes [provider]  citalopram (CELEXA) 40 MG tablet TAKE ONE TABLET BY MOUTH DAILY. 05/18/16  Yes [provider]    Family History Family History  Problem Relation Age of Onset  . Hypertension Mother   . Thyroid disease Mother   . Irregular heart beat Father   . Breast cancer Maternal Aunt   . Brain cancer Maternal Aunt   . Tuberculosis Maternal Grandmother   . Diabetes Paternal Grandmother   . Cancer Maternal Grandfather        lung  . Uterine cancer Maternal Aunt     Social History Social History  Substance Use Topics  . Smoking status: Former Smoker    Quit date: 11/04/2012  . Smokeless tobacco: Never Used     Comment: very rare  . Alcohol use Yes     Comment: 3 a month     Allergies   Patient has no known allergies.   Review of Systems Review of Systems ROS: Statement: All systems negative except as marked or noted in the HPI; Constitutional: Negative for fever and chills. ; ; Eyes: Negative for  eye pain, redness and discharge. ; ; ENMT: Negative for ear pain, hoarseness, nasal congestion, sinus pressure and sore throat. ; ; Cardiovascular: Negative for chest pain, palpitations, diaphoresis, dyspnea and peripheral edema. ; ; Respiratory: Negative for cough, wheezing and stridor. ; ; Gastrointestinal: +N/V/D, abd pain. Negative for blood in stool, hematemesis, jaundice and rectal bleeding. . ; ; Genitourinary: Negative for dysuria, flank pain and hematuria. ; ; Musculoskeletal: Negative for back pain and neck pain. Negative for swelling and trauma.; ; Skin: Negative for pruritus, rash, abrasions, blisters, bruising and skin lesion.; ; Neuro: Negative for headache, lightheadedness and neck stiffness. Negative for weakness, altered level of consciousness, altered mental status, extremity weakness, paresthesias, involuntary movement,  seizure and syncope.       Physical Exam Updated Vital Signs BP (!) 187/83 (BP Location: Left Arm)   Pulse (!) 51   Temp 97 F (36.1 C) (Axillary)   Resp 14   LMP 06/05/2014 Comment: postmenopausal bleeding in 06/05/14  SpO2 100%   Physical Exam 1745: Physical examination:  Nursing notes reviewed; Vital signs and O2 SAT reviewed;  Constitutional: Well developed, Well nourished, Well hydrated, In no acute distress; Head:  Normocephalic, atraumatic; Eyes: EOMI, PERRL, No scleral icterus; ENMT: Mouth and pharynx normal, Mucous membranes moist; Neck: Supple, Full range of motion, No lymphadenopathy; Cardiovascular: Regular rate and rhythm, No gallop; Respiratory: Breath sounds clear & equal bilaterally, No wheezes. Intermittently hyperventilating during exam. Speaking full sentences with ease, Normal respiratory effort/excursion; Chest: Nontender, Movement normal; Abdomen: Soft, +LLQ and suprapubic areas tender to palp. No rebound or guarding. Nondistended, Normal bowel sounds; Genitourinary: No CVA tenderness; Extremities: Pulses normal, No tenderness, No edema, No calf edema or asymmetry.; Neuro: AA&Ox3, Major CN grossly intact.  Speech clear. No gross focal motor or sensory deficits in extremities.; Skin: Color normal, Warm, Dry.   ED Treatments / Results  Labs (all labs ordered are listed, but only abnormal results are displayed)   EKG  EKG Interpretation None       Radiology   Procedures Procedures (including critical care time)  Medications Ordered in ED Medications  sodium chloride 0.9 % bolus 1,000 mL (not administered)  ondansetron (ZOFRAN) injection 4 mg (not administered)  dicyclomine (BENTYL) injection 20 mg (not administered)     Initial Impression / Assessment and Plan / ED Course  I have reviewed the triage vital signs and the nursing notes.  Pertinent labs & imaging results that were available during my care of the patient were reviewed by me and considered  in my medical decision making (see chart for details).  MDM Reviewed: previous chart, nursing note and vitals Reviewed previous: labs Interpretation: labs and CT scan   Results for orders placed or performed during the hospital encounter of 06/04/16  Comprehensive metabolic panel  Result Value Ref Range   Sodium 138 135 - 145 mmol/L   Potassium 3.5 3.5 - 5.1 mmol/L   Chloride 102 101 - 111 mmol/L   CO2 25 22 - 32 mmol/L   Glucose, Bld 143 (H) 65 - 99 mg/dL   BUN 12 6 - 20 mg/dL   Creatinine, Ser 0.72 0.44 - 1.00 mg/dL   Calcium 9.6 8.9 - 10.3 mg/dL   Total Protein 7.7 6.5 - 8.1 g/dL   Albumin 4.3 3.5 - 5.0 g/dL   AST 26 15 - 41 U/L   ALT 15 14 - 54 U/L   Alkaline Phosphatase 69 38 - 126 U/L   Total Bilirubin 0.7 0.3 - 1.2  mg/dL   GFR calc non Af Amer >60 >60 mL/min   GFR calc Af Amer >60 >60 mL/min   Anion gap 11 5 - 15  CBC with Differential  Result Value Ref Range   WBC 17.4 (H) 4.0 - 10.5 K/uL   RBC 4.98 3.87 - 5.11 MIL/uL   Hemoglobin 15.1 (H) 12.0 - 15.0 g/dL   HCT 44.0 36.0 - 46.0 %   MCV 88.4 78.0 - 100.0 fL   MCH 30.3 26.0 - 34.0 pg   MCHC 34.3 30.0 - 36.0 g/dL   RDW 13.7 11.5 - 15.5 %   Platelets 34 (L) 150 - 400 K/uL   Neutrophils Relative % 88 %   Lymphocytes Relative 8 %   Monocytes Relative 4 %   Eosinophils Relative 0 %   Basophils Relative 0 %   Neutro Abs 15.3 (H) 1.7 - 7.7 K/uL   Lymphs Abs 1.4 0.7 - 4.0 K/uL   Monocytes Absolute 0.7 0.1 - 1.0 K/uL   Eosinophils Absolute 0.0 0.0 - 0.7 K/uL   Basophils Absolute 0.0 0.0 - 0.1 K/uL   Smear Review MORPHOLOGY UNREMARKABLE   Lipase, blood  Result Value Ref Range   Lipase 31 11 - 51 U/L  Urinalysis, Routine w reflex microscopic  Result Value Ref Range   Color, Urine YELLOW YELLOW   APPearance HAZY (A) CLEAR   Specific Gravity, Urine 1.021 1.005 - 1.030   pH 6.0 5.0 - 8.0   Glucose, UA NEGATIVE NEGATIVE mg/dL   Hgb urine dipstick NEGATIVE NEGATIVE   Bilirubin Urine NEGATIVE NEGATIVE   Ketones, ur  80 (A) NEGATIVE mg/dL   Protein, ur NEGATIVE NEGATIVE mg/dL   Nitrite NEGATIVE NEGATIVE   Leukocytes, UA TRACE (A) NEGATIVE   RBC / HPF 6-30 0 - 5 RBC/hpf   WBC, UA 6-30 0 - 5 WBC/hpf   Bacteria, UA MANY (A) NONE SEEN   Squamous Epithelial / LPF 0-5 (A) NONE SEEN   Mucous PRESENT    Hyaline Casts, UA PRESENT    Ca Oxalate Crys, UA PRESENT    Ct Abdomen Pelvis W Contrast Result Date: 06/04/2016 CLINICAL DATA:  Left lower quadrant pain onset 3 hours prior to arrival. Nausea, vomiting and diarrhea. EXAM: CT ABDOMEN AND PELVIS WITH CONTRAST TECHNIQUE: Multidetector CT imaging of the abdomen and pelvis was performed using the standard protocol following bolus administration of intravenous contrast. CONTRAST:  100 cc Isovue-300 COMPARISON:  11/17/2009 FINDINGS: Lower chest: No acute abnormality. Hepatobiliary: No focal liver abnormality is seen. No gallstones, gallbladder wall thickening, or biliary dilatation. Pancreas: Unremarkable. No pancreatic ductal dilatation or surrounding inflammatory changes. Spleen: Splenectomy. Adrenals/Urinary Tract: Normal bilateral adrenal glands and kidneys without obstructive uropathy. Unremarkable appearance of the bladder. Stomach/Bowel: Nondistended stomach. Normal small bowel rotation without bowel obstruction. Mildly thickened long segment of transverse colon consistent with colitis. Normal position of the transverse colon may be altered due to splenectomy. Normal-appearing appendix. Vascular/Lymphatic: No significant vascular findings are present. No enlarged abdominal or pelvic lymph nodes. Reproductive: Uterus and bilateral adnexa are unremarkable. Mild engorgement of periuterine vessels on the left may represent changes of pelvic vascular congestion. Other: No abdominal wall hernia or abnormality. No abdominopelvic ascites. Musculoskeletal: Degenerative disc disease L5-S1. No acute nor suspicious osseous lesions. IMPRESSION: 1. Segmental thickening of the transverse  colon consistent with colitis. No bowel obstruction. 2. Status post splenectomy. 3. Mild prominence of left periuterine vessels can be seen with pelvic vascular congestion, a diagnosis of exclusion after other etiologies in  the workup of pelvic pain have been excluded. Electronically Signed   By: Ashley Royalty M.D.   On: 06/04/2016 20:06    2155:  Pt denies recent abx use. Will start IV cipro/flagyl. Stool studies requested. Pt continues to c/o nausea and "just not feeling well."  Dx and testing d/w pt.  Questions answered.  Verb understanding, agreeable to admit. T/C to [facility]. Blaine Hamper, case discussed, including:  HPI, pertinent PM/SHx, VS/PE, dx testing, ED course and treatment:  Agreeable to admit.    Final Clinical Impressions(s) / ED Diagnoses   Final diagnoses:  None    New Prescriptions New Prescriptions   No medications on file     Francine Graven, DO 06/07/16 2119

## 2016-06-05 DIAGNOSIS — R030 Elevated blood-pressure reading, without diagnosis of hypertension: Secondary | ICD-10-CM | POA: Diagnosis present

## 2016-06-05 DIAGNOSIS — R112 Nausea with vomiting, unspecified: Secondary | ICD-10-CM

## 2016-06-05 DIAGNOSIS — R197 Diarrhea, unspecified: Secondary | ICD-10-CM | POA: Diagnosis not present

## 2016-06-05 DIAGNOSIS — A058 Other specified bacterial foodborne intoxications: Secondary | ICD-10-CM | POA: Diagnosis not present

## 2016-06-05 DIAGNOSIS — Z87891 Personal history of nicotine dependence: Secondary | ICD-10-CM | POA: Diagnosis not present

## 2016-06-05 DIAGNOSIS — B9620 Unspecified Escherichia coli [E. coli] as the cause of diseases classified elsewhere: Secondary | ICD-10-CM | POA: Diagnosis present

## 2016-06-05 DIAGNOSIS — F418 Other specified anxiety disorders: Secondary | ICD-10-CM

## 2016-06-05 DIAGNOSIS — Z803 Family history of malignant neoplasm of breast: Secondary | ICD-10-CM | POA: Diagnosis not present

## 2016-06-05 DIAGNOSIS — F1011 Alcohol abuse, in remission: Secondary | ICD-10-CM | POA: Diagnosis present

## 2016-06-05 DIAGNOSIS — D693 Immune thrombocytopenic purpura: Secondary | ICD-10-CM | POA: Diagnosis present

## 2016-06-05 DIAGNOSIS — Z923 Personal history of irradiation: Secondary | ICD-10-CM | POA: Diagnosis not present

## 2016-06-05 DIAGNOSIS — Z9081 Acquired absence of spleen: Secondary | ICD-10-CM | POA: Diagnosis not present

## 2016-06-05 DIAGNOSIS — K529 Noninfective gastroenteritis and colitis, unspecified: Principal | ICD-10-CM

## 2016-06-05 DIAGNOSIS — Z853 Personal history of malignant neoplasm of breast: Secondary | ICD-10-CM | POA: Diagnosis not present

## 2016-06-05 DIAGNOSIS — Z79899 Other long term (current) drug therapy: Secondary | ICD-10-CM | POA: Diagnosis not present

## 2016-06-05 DIAGNOSIS — Z8249 Family history of ischemic heart disease and other diseases of the circulatory system: Secondary | ICD-10-CM | POA: Diagnosis not present

## 2016-06-05 LAB — GASTROINTESTINAL PANEL BY PCR, STOOL (REPLACES STOOL CULTURE)

## 2016-06-05 LAB — CBC
HCT: 38.1 % (ref 36.0–46.0)
Hemoglobin: 13.2 g/dL (ref 12.0–15.0)
MCH: 30.9 pg (ref 26.0–34.0)
MCHC: 34.6 g/dL (ref 30.0–36.0)
MCV: 89.2 fL (ref 78.0–100.0)
Platelets: 48 10*3/uL — ABNORMAL LOW (ref 150–400)
RBC: 4.27 MIL/uL (ref 3.87–5.11)
RDW: 14.1 % (ref 11.5–15.5)
WBC: 10.5 10*3/uL (ref 4.0–10.5)

## 2016-06-05 LAB — BASIC METABOLIC PANEL
Anion gap: 6 (ref 5–15)
BUN: 6 mg/dL (ref 6–20)
CO2: 27 mmol/L (ref 22–32)
Calcium: 8.8 mg/dL — ABNORMAL LOW (ref 8.9–10.3)
Chloride: 109 mmol/L (ref 101–111)
Creatinine, Ser: 0.64 mg/dL (ref 0.44–1.00)
GFR calc Af Amer: >60 mL/min (ref >60)
GFR calc non Af Amer: >60 mL/min (ref >60)
Glucose, Bld: 92 mg/dL (ref 65–99)
Potassium: 4.1 mmol/L (ref 3.5–5.1)
Sodium: 142 mmol/L (ref 135–145)

## 2016-06-05 LAB — GLUCOSE, CAPILLARY: Glucose-Capillary: 84 mg/dL (ref 65–99)

## 2016-06-05 LAB — LACTIC ACID, PLASMA
Lactic Acid, Venous: 0.6 mmol/L (ref 0.5–1.9)
Lactic Acid, Venous: 0.9 mmol/L (ref 0.5–1.9)

## 2016-06-05 LAB — HIV ANTIBODY (ROUTINE TESTING W REFLEX): HIV Screen 4th Generation wRfx: NONREACTIVE

## 2016-06-05 MED ORDER — CIPROFLOXACIN IN D5W 400 MG/200ML IV SOLN
400.0000 mg | Freq: Two times a day (BID) | INTRAVENOUS | Status: DC
  Administered 2016-06-05 – 2016-06-06 (×3): 400 mg via INTRAVENOUS
  Filled 2016-06-05 (×3): qty 200

## 2016-06-05 MED ORDER — HYDRALAZINE HCL 20 MG/ML IJ SOLN
5.0000 mg | INTRAMUSCULAR | Status: DC | PRN
  Filled 2016-06-05: qty 0.25

## 2016-06-05 NOTE — Progress Notes (Signed)
PROGRESS NOTE    TIPHANIE VO   UKG:[id number]  DOB: 05/30/1960  DOA: 06/04/2016 PCP: Lysle Pearl, MD   Brief Narrative:  Yvonne Hoover is a 56 y.o. female with medical history significant of IPT (s/p of splenectomy), depression, anxiety, breast cancer (s/p of lumpectomy, radiation therapy, no chemotherapy), alcohol abuse in remission, who presents with nausea, vomiting, diarrhea and abdominal pain. She describes pain as sharp, severe and present in the in the mid upper upper abdomen occurring soon after she ate lunch at "[employer]".   Subjective: Pain has improved. Still mildly nauseated but not vomiting. No diarrhea.   Assessment & Plan:   Principal Problem:   Colitis - Transverse colitis - noted to have symptoms onset after eating out at a restaurant for lunch - cont Cipro and Flagyl and f/u on stool studies - clear liquids and IVF for now - leukocytosis has resolved  Active Problems:   Immune thrombocytopenic purpura  -  per patient her platelets have been monitored closely as outpt and have been stable    Depression with anxiety - Celexa    Blood pressure elevated without history of HTN - cont to follow    DVT prophylaxis: SCDs Code Status: Full code Family Communication:  Disposition Plan: home when stable Consultants:    Procedures:    Antimicrobials:  Anti-infectives    Start     Dose/Rate Route Frequency Ordered Stop   06/05/16 1000  ciprofloxacin (CIPRO) IVPB 400 mg     400 mg 200 mL/hr over 60 Minutes Intravenous Every 12 hours 06/05/16 0012     06/05/16 0600  metroNIDAZOLE (FLAGYL) IVPB 500 mg     500 mg 100 mL/hr over 60 Minutes Intravenous Every 8 hours 06/04/16 2238     06/04/16 2130  ciprofloxacin (CIPRO) IVPB 400 mg     400 mg 200 mL/hr over 60 Minutes Intravenous  Once 06/04/16 2126 06/05/16 0054   06/04/16 2130  metroNIDAZOLE (FLAGYL) IVPB 500 mg     500 mg 100 mL/hr over 60 Minutes Intravenous  Once 06/04/16 2126 06/04/16 2336       Objective: Vitals:   06/04/16 2317 06/05/16 0010 06/05/16 0757 06/05/16 1300  BP: (!) 174/79  (!) 152/71 (!) 149/84  Pulse: (!) 59  60 62  Resp: 18  16 18   Temp: 98.9 F (37.2 C)  98.2 F (36.8 C) 98.4 F (36.9 C)  TempSrc: Oral  Oral Oral  SpO2: 100%  99% 100%  Weight:  67.6 kg (149 lb) 66.1 kg (145 lb 11.6 oz)   Height:   5\' 7"  (1.702 m)     Intake/Output Summary (Last 24 hours) at 06/05/16 1509 Last data filed at 06/05/16 0900  Gross per 24 hour  Intake             1000 ml  Output             1600 ml  Net             -600 ml   Filed Weights   06/05/16 0010 06/05/16 0757  Weight: 67.6 kg (149 lb) 66.1 kg (145 lb 11.6 oz)    Examination: General exam: Appears comfortable  HEENT: PERRLA, oral mucosa moist, no sclera icterus or thrush Respiratory system: Clear to auscultation. Respiratory effort normal. Cardiovascular system: S1 & S2 heard, RRR.  No murmurs  Gastrointestinal system: Abdomen soft, tender in mid upper abdomen. nondistended. Normal bowel sound. No organomegaly Central nervous system: Alert and  oriented. No focal neurological deficits. Extremities: No cyanosis, clubbing or edema Skin: No rashes or ulcers Psychiatry:  Mood & affect appropriate.     Data Reviewed: I have personally reviewed following labs and imaging studies  CBC:  Recent Labs Lab 06/04/16 1816 06/05/16 0634  WBC 17.4* 10.5  NEUTROABS 15.3*  --   HGB 15.1* 13.2  HCT 44.0 38.1  MCV 88.4 89.2  PLT 34* 48*   Basic Metabolic Panel:  Recent Labs Lab 06/04/16 1816 06/05/16 0634  NA 138 142  K 3.5 4.1  CL 102 109  CO2 25 27  GLUCOSE 143* 92  BUN 12 6  CREATININE 0.72 0.64  CALCIUM 9.6 8.8*   GFR: Estimated Creatinine Clearance: 75.4 mL/min (by C-G formula based on SCr of 0.64 mg/dL). Liver Function Tests:  Recent Labs Lab 06/04/16 1816  AST 26  ALT 15  ALKPHOS 69  BILITOT 0.7  PROT 7.7  ALBUMIN 4.3    Recent Labs Lab 06/04/16 1816  LIPASE 31   No  results for input(s): AMMONIA in the last 168 hours. Coagulation Profile: No results for input(s): INR, PROTIME in the last 168 hours. Cardiac Enzymes: No results for input(s): CKTOTAL, CKMB, CKMBINDEX, TROPONINI in the last 168 hours. BNP (last 3 results) No results for input(s): PROBNP in the last 8760 hours. HbA1C: No results for input(s): HGBA1C in the last 72 hours. CBG:  Recent Labs Lab 06/05/16 0731  GLUCAP 84   Lipid Profile: No results for input(s): CHOL, HDL, LDLCALC, TRIG, CHOLHDL, LDLDIRECT in the last 72 hours. Thyroid Function Tests: No results for input(s): TSH, T4TOTAL, FREET4, T3FREE, THYROIDAB in the last 72 hours. Anemia Panel: No results for input(s): VITAMINB12, FOLATE, FERRITIN, TIBC, IRON, RETICCTPCT in the last 72 hours. Urine analysis:    Component Value Date/Time   COLORURINE YELLOW 06/04/2016 1746   APPEARANCEUR HAZY (A) 06/04/2016 1746   LABSPEC 1.021 06/04/2016 1746   PHURINE 6.0 06/04/2016 1746   GLUCOSEU NEGATIVE 06/04/2016 1746   HGBUR NEGATIVE 06/04/2016 1746   BILIRUBINUR NEGATIVE 06/04/2016 1746   BILIRUBINUR n 03/18/2014 0857   KETONESUR 80 (A) 06/04/2016 1746   PROTEINUR NEGATIVE 06/04/2016 1746   UROBILINOGEN negative 03/18/2014 0857   UROBILINOGEN 0.2 10/27/2009 1145   NITRITE NEGATIVE 06/04/2016 1746   LEUKOCYTESUR TRACE (A) 06/04/2016 1746   Sepsis Labs: @LABRCNTIP (procalcitonin:4,lacticidven:4) ) Recent Results (from the past 240 hour(s))  Culture, blood (Routine X 2) w Reflex to ID Panel     Status: None (Preliminary result)   Collection Time: 06/04/16  9:54 PM  Result Value Ref Range Status   Specimen Description BLOOD BLOOD LEFT WRIST  Final   Special Requests   Final    IN PEDIATRIC BOTTLE Blood Culture results may not be optimal due to an excessive volume of blood received in culture bottles Performed at [facility] [address] [place], [place] [address]    Culture PENDING  Incomplete   Report Status  PENDING  Incomplete         Radiology Studies: Ct Abdomen Pelvis W Contrast  Result Date: 06/04/2016 CLINICAL DATA:  Left lower quadrant pain onset 3 hours prior to arrival. Nausea, vomiting and diarrhea. EXAM: CT ABDOMEN AND PELVIS WITH CONTRAST TECHNIQUE: Multidetector CT imaging of the abdomen and pelvis was performed using the standard protocol following bolus administration of intravenous contrast. CONTRAST:  100 cc Isovue-300 COMPARISON:  11/17/2009 FINDINGS: Lower chest: No acute abnormality. Hepatobiliary: No focal liver abnormality is seen. No gallstones, gallbladder  wall thickening, or biliary dilatation. Pancreas: Unremarkable. No pancreatic ductal dilatation or surrounding inflammatory changes. Spleen: Splenectomy. Adrenals/Urinary Tract: Normal bilateral adrenal glands and kidneys without obstructive uropathy. Unremarkable appearance of the bladder. Stomach/Bowel: Nondistended stomach. Normal small bowel rotation without bowel obstruction. Mildly thickened long segment of transverse colon consistent with colitis. Normal position of the transverse colon may be altered due to splenectomy. Normal-appearing appendix. Vascular/Lymphatic: No significant vascular findings are present. No enlarged abdominal or pelvic lymph nodes. Reproductive: Uterus and bilateral adnexa are unremarkable. Mild engorgement of periuterine vessels on the left may represent changes of pelvic vascular congestion. Other: No abdominal wall hernia or abnormality. No abdominopelvic ascites. Musculoskeletal: Degenerative disc disease L5-S1. No acute nor suspicious osseous lesions. IMPRESSION: 1. Segmental thickening of the transverse colon consistent with colitis. No bowel obstruction. 2. Status post splenectomy. 3. Mild prominence of left periuterine vessels can be seen with pelvic vascular congestion, a diagnosis of exclusion after other etiologies in the workup of pelvic pain have been excluded. Electronically Signed   By:  Ashley Royalty M.D.   On: 06/04/2016 20:06      Scheduled Meds: . citalopram  40 mg Oral Daily   Continuous Infusions: . sodium chloride 500 mL (06/04/16 2357)  . ciprofloxacin 400 mg (06/05/16 1238)  . metronidazole 500 mg (06/05/16 1450)  . sodium chloride       LOS: 0 days    Time spent in minutes: 35    Debbe Odea, MD [facility] Pager: [email or web address] Password [email or web address] 06/05/2016, 3:09 PM

## 2016-06-05 NOTE — Progress Notes (Signed)
Notified night physician results of patient GI panel.

## 2016-06-05 NOTE — Progress Notes (Signed)
Page: Yvonne Hoover 1503 [facility] GI panel was (+) for enteropathogenic E-coli and Enterotoxigenic E-coli. Previous page went to the wrong Md

## 2016-06-05 NOTE — Progress Notes (Signed)
Lab called with results of patient GI panel.  Indicates enteropathogenic ecoli and enterotoxogenic ecoli.

## 2016-06-06 DIAGNOSIS — A058 Other specified bacterial foodborne intoxications: Secondary | ICD-10-CM

## 2016-06-06 DIAGNOSIS — A044 Other intestinal Escherichia coli infections: Secondary | ICD-10-CM

## 2016-06-06 LAB — BASIC METABOLIC PANEL
Anion gap: 5 (ref 5–15)
BUN: <5 mg/dL — ABNORMAL LOW (ref 6–20)
CO2: 28 mmol/L (ref 22–32)
Calcium: 8.9 mg/dL (ref 8.9–10.3)
Chloride: 108 mmol/L (ref 101–111)
Creatinine, Ser: 0.63 mg/dL (ref 0.44–1.00)
GFR calc Af Amer: >60 mL/min (ref >60)
GFR calc non Af Amer: >60 mL/min (ref >60)
Glucose, Bld: 87 mg/dL (ref 65–99)
Potassium: 3.8 mmol/L (ref 3.5–5.1)
Sodium: 141 mmol/L (ref 135–145)

## 2016-06-06 LAB — CBC
HCT: 40.1 % (ref 36.0–46.0)
Hemoglobin: 13.4 g/dL (ref 12.0–15.0)
MCH: 30.1 pg (ref 26.0–34.0)
MCHC: 33.4 g/dL (ref 30.0–36.0)
MCV: 90.1 fL (ref 78.0–100.0)
Platelets: 67 10*3/uL — ABNORMAL LOW (ref 150–400)
RBC: 4.45 MIL/uL (ref 3.87–5.11)
RDW: 14.1 % (ref 11.5–15.5)
WBC: 7.9 10*3/uL (ref 4.0–10.5)

## 2016-06-06 LAB — URINE CULTURE: Culture: NO GROWTH

## 2016-06-06 LAB — GLUCOSE, CAPILLARY: Glucose-Capillary: 78 mg/dL (ref 65–99)

## 2016-06-06 MED ORDER — CIPROFLOXACIN HCL 500 MG PO TABS: 500.0000 mg | ORAL_TABLET | Freq: Two times a day (BID) | ORAL | 0 refills | Status: DC

## 2016-06-06 NOTE — Discharge Summary (Signed)
Physician Discharge Summary  Yvonne Hoover WUJ:[id number] DOB: 1960-07-16 DOA: 06/04/2016  PCP: Yvonne Pearl, MD  Admit date: 06/04/2016 Discharge date: 06/06/2016  Admitted From: home Disposition:  home   Recommendations for Outpatient Follow-up:  1. F/u on HTN  Discharge Condition:  stable   CODE STATUS:  Full code   Consultations:  none    Discharge Diagnoses:  Principal Problem:   Colitis Active Problems:   Immune thrombocytopenic purpura (Yvonne Hoover)   Depression with anxiety   Blood pressure elevated without history of HTN    Subjective: Diarrhea is improving and stools are less frequent. She is not having any further abdominal pain. No fevers or chills.   Brief Summary: Yvonne Hoover a 56 y.o.femalewith medical history significant of IPT(s/p of splenectomy), depression, anxiety, breast cancer (s/p of lumpectomy, radiation therapy, no chemotherapy), alcohol abuse in remission, who presents with nausea, vomiting, diarrhea and abdominal pain. She describes pain as sharp, severe and present in the in the mid upper upper abdomen occurring soon after she ate a spinach salad at "[employer]".   Hospital Course:  Principal Problem:   Colitis - Transverse colitis - noted to have symptoms onset after eating out at a restaurant for lunch - started on Cipro and Flagyl-  stool studies were sent - GI pathogen panel resulted on 6/2 and revealed Enterotoxigenic e coli - WBC count 17 on admission-  leukocytosis has resolved - blood cultures remain negative - symptoms have improved significantly- I have d/c'd Flagyl and she will be discharged home on Cipro   Active Problems:  GI motility issues - she describes issues with abdominal cramping and diarrhea for about 1 month now - ? IBS vs food intolerance- I have asked her to limit lactose use and re-introduce into diet slowly to determine if this could be causing her symptoms - she has seen Dr Collene Mares in the past for a colonoscopy-  I have advised her to f/u with GI in 1-2 wks once acute issues resolve    Immune thrombocytopenic purpura  -  per patient her platelets have been monitored closely as outpt and lately have been stable - her baseline appears to be 50-60,000 - her platelets did drop to 34 which was likely due to acute gram negative infection and have recuperated as her infection has come under control.     Depression with anxiety - Celexa    Blood pressure elevated without history of HTN - cont to follow as outtpt   Discharge Instructions  Discharge Instructions    Diet - low sodium heart healthy    Complete by:  As directed    Low fiber, soft easy to digest diet- limit lactose for 5-7 days and then re-introduce.   Increase activity slowly    Complete by:  As directed      Allergies as of 06/06/2016   No Known Allergies     Medication List    TAKE these medications   bismuth subsalicylate 956 MG chewable tablet Commonly known as:  PEPTO BISMOL Chew 524 mg by mouth daily as needed for indigestion.   ciprofloxacin 500 MG tablet Commonly known as:  CIPRO Take 1 tablet (500 mg total) by mouth 2 (two) times daily.   citalopram 40 MG tablet Commonly known as:  CELEXA TAKE ONE TABLET BY MOUTH DAILY.       No Known Allergies   Procedures/Studies:    Ct Abdomen Pelvis W Contrast  Result Date: 06/04/2016 CLINICAL DATA:  Left  lower quadrant pain onset 3 hours prior to arrival. Nausea, vomiting and diarrhea. EXAM: CT ABDOMEN AND PELVIS WITH CONTRAST TECHNIQUE: Multidetector CT imaging of the abdomen and pelvis was performed using the standard protocol following bolus administration of intravenous contrast. CONTRAST:  100 cc Isovue-300 COMPARISON:  11/17/2009 FINDINGS: Lower chest: No acute abnormality. Hepatobiliary: No focal liver abnormality is seen. No gallstones, gallbladder wall thickening, or biliary dilatation. Pancreas: Unremarkable. No pancreatic ductal dilatation or surrounding  inflammatory changes. Spleen: Splenectomy. Adrenals/Urinary Tract: Normal bilateral adrenal glands and kidneys without obstructive uropathy. Unremarkable appearance of the bladder. Stomach/Bowel: Nondistended stomach. Normal small bowel rotation without bowel obstruction. Mildly thickened long segment of transverse colon consistent with colitis. Normal position of the transverse colon may be altered due to splenectomy. Normal-appearing appendix. Vascular/Lymphatic: No significant vascular findings are present. No enlarged abdominal or pelvic lymph nodes. Reproductive: Uterus and bilateral adnexa are unremarkable. Mild engorgement of periuterine vessels on the left may represent changes of pelvic vascular congestion. Other: No abdominal wall hernia or abnormality. No abdominopelvic ascites. Musculoskeletal: Degenerative disc disease L5-S1. No acute nor suspicious osseous lesions. IMPRESSION: 1. Segmental thickening of the transverse colon consistent with colitis. No bowel obstruction. 2. Status post splenectomy. 3. Mild prominence of left periuterine vessels can be seen with pelvic vascular congestion, a diagnosis of exclusion after other etiologies in the workup of pelvic pain have been excluded. Electronically Signed   By: Ashley Royalty M.D.   On: 06/04/2016 20:06        Discharge Exam: Vitals:   06/05/16 2049 06/06/16 0548  BP: (!) 168/86 (!) 156/87  Pulse: 68 68  Resp: 19 17  Temp: 97.9 F (36.6 C) 97.8 F (36.6 C)   Vitals:   06/05/16 0757 06/05/16 1300 06/05/16 2049 06/06/16 0548  BP: (!) 152/71 (!) 149/84 (!) 168/86 (!) 156/87  Pulse: 60 62 68 68  Resp: 16 18 19 17   Temp: 98.2 F (36.8 C) 98.4 F (36.9 C) 97.9 F (36.6 C) 97.8 F (36.6 C)  TempSrc: Oral Oral Oral Oral  SpO2: 99% 100% 96% 98%  Weight: 66.1 kg (145 lb 11.6 oz)     Height: 5\' 7"  (1.702 m)       General: Pt is alert, awake, not in acute distress Cardiovascular: RRR, S1/S2 +, no rubs, no gallops Respiratory: CTA  bilaterally, no wheezing, no rhonchi Abdominal: Soft, NT, ND, bowel sounds + Extremities: no edema, no cyanosis    The results of significant diagnostics from this hospitalization (including imaging, microbiology, ancillary and laboratory) are listed below for reference.     Microbiology: Recent Results (from the past 240 hour(s))  Urine culture     Status: None   Collection Time: 06/04/16  5:46 PM  Result Value Ref Range Status   Specimen Description URINE, CLEAN CATCH  Final   Special Requests NONE  Final   Culture   Final    NO GROWTH Performed at [facility], [address], [place], [place] [address]    Report Status 06/06/2016 FINAL  Final  Culture, blood (Routine X 2) w Reflex to ID Panel     Status: None (Preliminary result)   Collection Time: 06/04/16  9:54 PM  Result Value Ref Range Status   Specimen Description BLOOD BLOOD LEFT WRIST  Final   Special Requests   Final    IN PEDIATRIC BOTTLE Blood Culture results may not be optimal due to an excessive volume of blood received in culture bottles Performed at [facility], [place] [address], [place], [place] [address]    Culture PENDING  Incomplete   Report Status PENDING  Incomplete  Gastrointestinal Panel by PCR , Stool     Status: Abnormal   Collection Time: 06/05/16 10:12 AM  Result Value Ref Range Status   Campylobacter species NOT DETECTED NOT DETECTED Final   Plesimonas shigelloides NOT DETECTED NOT DETECTED Final   Salmonella species NOT DETECTED NOT DETECTED Final   Yersinia enterocolitica NOT DETECTED NOT DETECTED Final   Vibrio species NOT DETECTED NOT DETECTED Final   Vibrio cholerae NOT DETECTED NOT DETECTED Final   Enteroaggregative E coli (EAEC) NOT DETECTED NOT DETECTED Final   Enteropathogenic E coli (EPEC) DETECTED (A) NOT DETECTED Final    Comment: RESULT CALLED TO, READ BACK BY AND VERIFIED WITH: TERESA PARKS AT 1900 ON 06/05/16 RWW    Enterotoxigenic E coli (ETEC) DETECTED (A) NOT  DETECTED Final    Comment: RESULT CALLED TO, READ BACK BY AND VERIFIED WITH: TERESA PARKS AT 1900 ON 06/05/16 RWW    Shiga like toxin producing E coli (STEC) NOT DETECTED NOT DETECTED Final   Shigella/Enteroinvasive E coli (EIEC) NOT DETECTED NOT DETECTED Final   Cryptosporidium NOT DETECTED NOT DETECTED Final   Cyclospora cayetanensis NOT DETECTED NOT DETECTED Final   Entamoeba histolytica NOT DETECTED NOT DETECTED Final   Giardia lamblia NOT DETECTED NOT DETECTED Final   Adenovirus F40/41 NOT DETECTED NOT DETECTED Final   Astrovirus NOT DETECTED NOT DETECTED Final   Norovirus GI/GII NOT DETECTED NOT DETECTED Final   Rotavirus A NOT DETECTED NOT DETECTED Final   Sapovirus (I, II, IV, and V) NOT DETECTED NOT DETECTED Final     Labs: BNP (last 3 results) No results for input(s): BNP in the last 8760 hours. Basic Metabolic Panel:  Recent Labs Lab 06/04/16 1816 06/05/16 0634 06/06/16 0601  NA 138 142 141  K 3.5 4.1 3.8  CL 102 109 108  CO2 25 27 28   GLUCOSE 143* 92 87  BUN 12 6 <5*  CREATININE 0.72 0.64 0.63  CALCIUM 9.6 8.8* 8.9   Liver Function Tests:  Recent Labs Lab 06/04/16 1816  AST 26  ALT 15  ALKPHOS 69  BILITOT 0.7  PROT 7.7  ALBUMIN 4.3    Recent Labs Lab 06/04/16 1816  LIPASE 31   No results for input(s): AMMONIA in the last 168 hours. CBC:  Recent Labs Lab 06/04/16 1816 06/05/16 0634 06/06/16 0601  WBC 17.4* 10.5 7.9  NEUTROABS 15.3*  --   --   HGB 15.1* 13.2 13.4  HCT 44.0 38.1 40.1  MCV 88.4 89.2 90.1  PLT 34* 48* 67*   Cardiac Enzymes: No results for input(s): CKTOTAL, CKMB, CKMBINDEX, TROPONINI in the last 168 hours. BNP: Invalid input(s): POCBNP CBG:  Recent Labs Lab 06/05/16 0731 06/06/16 0738  GLUCAP 84 78   D-Dimer No results for input(s): DDIMER in the last 72 hours. Hgb A1c No results for input(s): HGBA1C in the last 72 hours. Lipid Profile No results for input(s): CHOL, HDL, LDLCALC, TRIG, CHOLHDL, LDLDIRECT in  the last 72 hours. Thyroid function studies No results for input(s): TSH, T4TOTAL, T3FREE, THYROIDAB in the last 72 hours.  Invalid input(s): FREET3 Anemia work up No results for input(s): VITAMINB12, FOLATE, FERRITIN, TIBC, IRON, RETICCTPCT in the last 72 hours. Urinalysis    Component Value Date/Time   COLORURINE YELLOW 06/04/2016 1746   APPEARANCEUR HAZY (A) 06/04/2016 1746   LABSPEC 1.021 06/04/2016 1746  PHURINE 6.0 06/04/2016 1746   GLUCOSEU NEGATIVE 06/04/2016 1746   HGBUR NEGATIVE 06/04/2016 1746   BILIRUBINUR NEGATIVE 06/04/2016 1746   BILIRUBINUR n 03/18/2014 0857   KETONESUR 80 (A) 06/04/2016 1746   PROTEINUR NEGATIVE 06/04/2016 1746   UROBILINOGEN negative 03/18/2014 0857   UROBILINOGEN 0.2 10/27/2009 1145   NITRITE NEGATIVE 06/04/2016 1746   LEUKOCYTESUR TRACE (A) 06/04/2016 1746   Sepsis Labs Invalid input(s): PROCALCITONIN,  WBC,  LACTICIDVEN Microbiology Recent Results (from the past 240 hour(s))  Urine culture     Status: None   Collection Time: 06/04/16  5:46 PM  Result Value Ref Range Status   Specimen Description URINE, CLEAN CATCH  Final   Special Requests NONE  Final   Culture   Final    NO GROWTH Performed at [facility], [place] [address], [place], [place] [address]    Report Status 06/06/2016 FINAL  Final  Culture, blood (Routine X 2) w Reflex to ID Panel     Status: None (Preliminary result)   Collection Time: 06/04/16  9:54 PM  Result Value Ref Range Status   Specimen Description BLOOD BLOOD LEFT WRIST  Final   Special Requests   Final    IN PEDIATRIC BOTTLE Blood Culture results may not be optimal due to an excessive volume of blood received in culture bottles Performed at [facility] [address] [place], [place] [address]    Culture PENDING  Incomplete   Report Status PENDING  Incomplete  Gastrointestinal Panel by PCR , Stool     Status: Abnormal   Collection Time: 06/05/16 10:12 AM  Result Value Ref Range Status    Campylobacter species NOT DETECTED NOT DETECTED Final   Plesimonas shigelloides NOT DETECTED NOT DETECTED Final   Salmonella species NOT DETECTED NOT DETECTED Final   Yersinia enterocolitica NOT DETECTED NOT DETECTED Final   Vibrio species NOT DETECTED NOT DETECTED Final   Vibrio cholerae NOT DETECTED NOT DETECTED Final   Enteroaggregative E coli (EAEC) NOT DETECTED NOT DETECTED Final   Enteropathogenic E coli (EPEC) DETECTED (A) NOT DETECTED Final    Comment: RESULT CALLED TO, READ BACK BY AND VERIFIED WITH: TERESA PARKS AT 1900 ON 06/05/16 RWW    Enterotoxigenic E coli (ETEC) DETECTED (A) NOT DETECTED Final    Comment: RESULT CALLED TO, READ BACK BY AND VERIFIED WITH: TERESA PARKS AT 1900 ON 06/05/16 RWW    Shiga like toxin producing E coli (STEC) NOT DETECTED NOT DETECTED Final   Shigella/Enteroinvasive E coli (EIEC) NOT DETECTED NOT DETECTED Final   Cryptosporidium NOT DETECTED NOT DETECTED Final   Cyclospora cayetanensis NOT DETECTED NOT DETECTED Final   Entamoeba histolytica NOT DETECTED NOT DETECTED Final   Giardia lamblia NOT DETECTED NOT DETECTED Final   Adenovirus F40/41 NOT DETECTED NOT DETECTED Final   Astrovirus NOT DETECTED NOT DETECTED Final   Norovirus GI/GII NOT DETECTED NOT DETECTED Final   Rotavirus A NOT DETECTED NOT DETECTED Final   Sapovirus (I, II, IV, and V) NOT DETECTED NOT DETECTED Final     Time coordinating discharge: Over 30 minutes  SIGNED:   Debbe Odea, MD  [facility] 06/06/2016, 11:40 AM Pager   If 7PM-7AM, please contact night-coverage [email or web address] Password [email or web address]

## 2016-06-06 NOTE — Progress Notes (Signed)
After discharge, patient called to confirm if OK to take her Celexa with the prescribed Cipro.  Patient reports the pharmacist reported there may be an interaction with the medications.  Contacted physician and physician reports OK to take medications together.  Physician reported EKG done while patient in hospital and taking medications.  Called patient back at [phone] to report that physician states it is OK to take medications together.

## 2016-06-06 NOTE — Progress Notes (Signed)
Patient discharged home.  Leaving with personal belongings and one prescription.  Reports understanding of discharge instructions.  Room air, denies pain, no s/s of distress.  Accompanied by significant other.  No complaints.

## 2016-06-10 LAB — CULTURE, BLOOD (ROUTINE X 2)
Culture: NO GROWTH
Culture: NO GROWTH

## 2016-06-21 ENCOUNTER — Other Ambulatory Visit (INDEPENDENT_AMBULATORY_CARE_PROVIDER_SITE_OTHER): Payer: BLUE CROSS/BLUE SHIELD

## 2016-06-21 DIAGNOSIS — Z853 Personal history of malignant neoplasm of breast: Secondary | ICD-10-CM

## 2016-06-21 DIAGNOSIS — D693 Immune thrombocytopenic purpura: Secondary | ICD-10-CM | POA: Diagnosis not present

## 2016-06-22 LAB — COMPREHENSIVE METABOLIC PANEL
ALT: 12 IU/L (ref 0–32)
AST: 18 IU/L (ref 0–40)
Albumin/Globulin Ratio: 1.8 (ref 1.2–2.2)
Albumin: 3.9 g/dL (ref 3.5–5.5)
Alkaline Phosphatase: 69 IU/L (ref 39–117)
BUN/Creatinine Ratio: 11 (ref 9–23)
BUN: 6 mg/dL (ref 6–24)
Bilirubin Total: 0.3 mg/dL (ref 0.0–1.2)
CO2: 25 mmol/L (ref 20–29)
Calcium: 9.2 mg/dL (ref 8.7–10.2)
Chloride: 103 mmol/L (ref 96–106)
Creatinine, Ser: 0.57 mg/dL (ref 0.57–1.00)
GFR calc Af Amer: 119 mL/min/{1.73_m2} (ref >59)
GFR calc non Af Amer: 103 mL/min/{1.73_m2} (ref >59)
Globulin, Total: 2.2 g/dL (ref 1.5–4.5)
Glucose: 70 mg/dL (ref 65–99)
Potassium: 3.7 mmol/L (ref 3.5–5.2)
Sodium: 143 mmol/L (ref 134–144)
Total Protein: 6.1 g/dL (ref 6.0–8.5)

## 2016-06-22 LAB — CBC WITH DIFFERENTIAL/PLATELET
Basophils Absolute: 0.1 10*3/uL (ref 0.0–0.2)
Basos: 2 %
EOS (ABSOLUTE): 0.2 10*3/uL (ref 0.0–0.4)
Eos: 4 %
Hematocrit: 42.6 % (ref 34.0–46.6)
Hemoglobin: 13.9 g/dL (ref 11.1–15.9)
Immature Grans (Abs): 0 10*3/uL (ref 0.0–0.1)
Immature Granulocytes: 0 %
Lymphocytes Absolute: 1.7 10*3/uL (ref 0.7–3.1)
Lymphs: 29 %
MCH: 30 pg (ref 26.6–33.0)
MCHC: 32.6 g/dL (ref 31.5–35.7)
MCV: 92 fL (ref 79–97)
Monocytes Absolute: 0.7 10*3/uL (ref 0.1–0.9)
Monocytes: 12 %
Neutrophils Absolute: 3.2 10*3/uL (ref 1.4–7.0)
Neutrophils: 53 %
Platelets: 53 10*3/uL — CL (ref 150–379)
RBC: 4.64 x10E6/uL (ref 3.77–5.28)
RDW: 14.5 % (ref 12.3–15.4)
WBC: 6 10*3/uL (ref 3.4–10.8)

## 2016-06-23 ENCOUNTER — Telehealth: Payer: Self-pay | Admitting: *Deleted

## 2016-06-23 NOTE — Telephone Encounter (Signed)
-----   Message from Annia Belt, MD sent at 06/22/2016 11:24 AM EDT ----- Call pt: platelets holding at 53,000. We can wait 4 weeks to check again

## 2016-06-23 NOTE — Telephone Encounter (Signed)
Pt called - no answer; left message "platelets holding at 53,000. We can wait 4 weeks to check again " per Dr Beryle Beams. And to call back to schedule lab appt and for any questions.

## 2016-06-30 NOTE — Telephone Encounter (Signed)
Left message to give [place] a call back to schedule lab appt.

## 2016-07-30 ENCOUNTER — Telehealth: Payer: Self-pay | Admitting: *Deleted

## 2016-07-30 NOTE — Telephone Encounter (Signed)
Called pt - lab appt scheduled Aug 1 @ 0900 AM.

## 2016-08-03 ENCOUNTER — Other Ambulatory Visit: Payer: Self-pay | Admitting: Oncology

## 2016-08-03 DIAGNOSIS — D693 Immune thrombocytopenic purpura: Secondary | ICD-10-CM

## 2016-08-04 ENCOUNTER — Other Ambulatory Visit (INDEPENDENT_AMBULATORY_CARE_PROVIDER_SITE_OTHER): Payer: BLUE CROSS/BLUE SHIELD

## 2016-08-04 DIAGNOSIS — D693 Immune thrombocytopenic purpura: Secondary | ICD-10-CM | POA: Diagnosis not present

## 2016-08-05 LAB — CBC WITH DIFFERENTIAL/PLATELET
Basophils Absolute: 0.1 10*3/uL (ref 0.0–0.2)
Basos: 2 %
EOS (ABSOLUTE): 0.1 10*3/uL (ref 0.0–0.4)
Eos: 2 %
Hematocrit: 41.2 % (ref 34.0–46.6)
Hemoglobin: 13.3 g/dL (ref 11.1–15.9)
Immature Grans (Abs): 0 10*3/uL (ref 0.0–0.1)
Immature Granulocytes: 0 %
Lymphocytes Absolute: 1.5 10*3/uL (ref 0.7–3.1)
Lymphs: 27 %
MCH: 30 pg (ref 26.6–33.0)
MCHC: 32.3 g/dL (ref 31.5–35.7)
MCV: 93 fL (ref 79–97)
Monocytes Absolute: 0.6 10*3/uL (ref 0.1–0.9)
Monocytes: 10 %
Neutrophils Absolute: 3.4 10*3/uL (ref 1.4–7.0)
Neutrophils: 59 %
Platelets: 59 10*3/uL — CL (ref 150–379)
RBC: 4.44 x10E6/uL (ref 3.77–5.28)
RDW: 14.4 % (ref 12.3–15.4)
WBC: 5.7 10*3/uL (ref 3.4–10.8)

## 2016-08-06 ENCOUNTER — Telehealth: Payer: Self-pay | Admitting: *Deleted

## 2016-08-06 NOTE — Telephone Encounter (Signed)
Pt called - no answer; left message "platelets holding @59 ,000. We can change to Q 2 month labs" per Dr Beryle Beams. And call back to scheduled next lab appt.

## 2016-08-06 NOTE — Telephone Encounter (Signed)
-----   Message from Annia Belt, MD sent at 08/05/2016 10:43 AM EDT ----- Call pt: platelets holding @59 ,000. We can change to Q 2 month labs

## 2016-11-08 ENCOUNTER — Other Ambulatory Visit: Payer: Self-pay | Admitting: Oncology

## 2016-11-08 DIAGNOSIS — D693 Immune thrombocytopenic purpura: Secondary | ICD-10-CM

## 2016-11-08 DIAGNOSIS — Z853 Personal history of malignant neoplasm of breast: Secondary | ICD-10-CM

## 2016-11-09 ENCOUNTER — Other Ambulatory Visit (INDEPENDENT_AMBULATORY_CARE_PROVIDER_SITE_OTHER): Payer: BLUE CROSS/BLUE SHIELD

## 2016-11-09 DIAGNOSIS — D693 Immune thrombocytopenic purpura: Secondary | ICD-10-CM

## 2016-11-09 DIAGNOSIS — Z853 Personal history of malignant neoplasm of breast: Secondary | ICD-10-CM

## 2016-11-10 LAB — COMPREHENSIVE METABOLIC PANEL
ALT: 11 IU/L (ref 0–32)
AST: 18 IU/L (ref 0–40)
Albumin/Globulin Ratio: 1.6 (ref 1.2–2.2)
Albumin: 4.2 g/dL (ref 3.5–5.5)
Alkaline Phosphatase: 72 IU/L (ref 39–117)
BUN/Creatinine Ratio: 13 (ref 9–23)
BUN: 9 mg/dL (ref 6–24)
Bilirubin Total: 0.4 mg/dL (ref 0.0–1.2)
CO2: 26 mmol/L (ref 20–29)
Calcium: 9 mg/dL (ref 8.7–10.2)
Chloride: 103 mmol/L (ref 96–106)
Creatinine, Ser: 0.68 mg/dL (ref 0.57–1.00)
GFR calc Af Amer: 112 mL/min/{1.73_m2} (ref >59)
GFR calc non Af Amer: 97 mL/min/{1.73_m2} (ref >59)
Globulin, Total: 2.6 g/dL (ref 1.5–4.5)
Glucose: 65 mg/dL (ref 65–99)
Potassium: 4.3 mmol/L (ref 3.5–5.2)
Sodium: 143 mmol/L (ref 134–144)
Total Protein: 6.8 g/dL (ref 6.0–8.5)

## 2016-11-10 LAB — CBC WITH DIFFERENTIAL/PLATELET
Basophils Absolute: 0.1 10*3/uL (ref 0.0–0.2)
Basos: 2 %
EOS (ABSOLUTE): 0.1 10*3/uL (ref 0.0–0.4)
Eos: 2 %
Hematocrit: 42.2 % (ref 34.0–46.6)
Hemoglobin: 13.8 g/dL (ref 11.1–15.9)
Immature Grans (Abs): 0 10*3/uL (ref 0.0–0.1)
Immature Granulocytes: 0 %
Lymphocytes Absolute: 2 10*3/uL (ref 0.7–3.1)
Lymphs: 34 %
MCH: 30.3 pg (ref 26.6–33.0)
MCHC: 32.7 g/dL (ref 31.5–35.7)
MCV: 93 fL (ref 79–97)
Monocytes Absolute: 0.8 10*3/uL (ref 0.1–0.9)
Monocytes: 13 %
Neutrophils Absolute: 2.9 10*3/uL (ref 1.4–7.0)
Neutrophils: 49 %
Platelets: 82 10*3/uL — CL (ref 150–379)
RBC: 4.55 x10E6/uL (ref 3.77–5.28)
RDW: 14 % (ref 12.3–15.4)
WBC: 5.8 10*3/uL (ref 3.4–10.8)

## 2016-11-11 ENCOUNTER — Telehealth: Payer: Self-pay | Admitting: *Deleted

## 2016-11-11 NOTE — Telephone Encounter (Signed)
Pt called / informed "you are on a roll! Platelets 82,000" per Dr Beryle Beams. Stated "great - I must be doing something right". Next lab appt scheduled Jan 15 @ 0900 AM.

## 2016-11-11 NOTE — Telephone Encounter (Signed)
-----   Message from Yvonne Belt, MD sent at 11/10/2016  7:19 AM EST ----- Call pt: you are on a roll! Platelets 82,000

## 2017-01-17 ENCOUNTER — Other Ambulatory Visit: Payer: Self-pay | Admitting: Oncology

## 2017-01-17 DIAGNOSIS — D693 Immune thrombocytopenic purpura: Secondary | ICD-10-CM

## 2017-01-17 DIAGNOSIS — Z853 Personal history of malignant neoplasm of breast: Secondary | ICD-10-CM

## 2017-01-18 ENCOUNTER — Other Ambulatory Visit (INDEPENDENT_AMBULATORY_CARE_PROVIDER_SITE_OTHER): Payer: BLUE CROSS/BLUE SHIELD

## 2017-01-18 DIAGNOSIS — D693 Immune thrombocytopenic purpura: Secondary | ICD-10-CM | POA: Diagnosis not present

## 2017-01-18 DIAGNOSIS — Z853 Personal history of malignant neoplasm of breast: Secondary | ICD-10-CM

## 2017-01-19 LAB — CBC WITH DIFFERENTIAL/PLATELET
Basophils Absolute: 0.1 10*3/uL (ref 0.0–0.2)
Basos: 1 %
EOS (ABSOLUTE): 0.1 10*3/uL (ref 0.0–0.4)
Eos: 1 %
Hematocrit: 42.7 % (ref 34.0–46.6)
Hemoglobin: 13.8 g/dL (ref 11.1–15.9)
Immature Grans (Abs): 0 10*3/uL (ref 0.0–0.1)
Immature Granulocytes: 0 %
Lymphocytes Absolute: 2.1 10*3/uL (ref 0.7–3.1)
Lymphs: 28 %
MCH: 30.1 pg (ref 26.6–33.0)
MCHC: 32.3 g/dL (ref 31.5–35.7)
MCV: 93 fL (ref 79–97)
Monocytes Absolute: 0.7 10*3/uL (ref 0.1–0.9)
Monocytes: 9 %
Neutrophils Absolute: 4.4 10*3/uL (ref 1.4–7.0)
Neutrophils: 61 %
Platelets: 64 10*3/uL — CL (ref 150–379)
RBC: 4.59 x10E6/uL (ref 3.77–5.28)
RDW: 14.4 % (ref 12.3–15.4)
WBC: 7.3 10*3/uL (ref 3.4–10.8)

## 2017-01-19 LAB — COMPREHENSIVE METABOLIC PANEL
ALT: 11 IU/L (ref 0–32)
AST: 18 IU/L (ref 0–40)
Albumin/Globulin Ratio: 1.7 (ref 1.2–2.2)
Albumin: 4.3 g/dL (ref 3.5–5.5)
Alkaline Phosphatase: 73 IU/L (ref 39–117)
BUN/Creatinine Ratio: 15 (ref 9–23)
BUN: 11 mg/dL (ref 6–24)
Bilirubin Total: 0.3 mg/dL (ref 0.0–1.2)
CO2: 24 mmol/L (ref 20–29)
Calcium: 9.2 mg/dL (ref 8.7–10.2)
Chloride: 105 mmol/L (ref 96–106)
Creatinine, Ser: 0.75 mg/dL (ref 0.57–1.00)
GFR calc Af Amer: 102 mL/min/{1.73_m2} (ref >59)
GFR calc non Af Amer: 88 mL/min/{1.73_m2} (ref >59)
Globulin, Total: 2.5 g/dL (ref 1.5–4.5)
Glucose: 81 mg/dL (ref 65–99)
Potassium: 3.9 mmol/L (ref 3.5–5.2)
Sodium: 144 mmol/L (ref 134–144)
Total Protein: 6.8 g/dL (ref 6.0–8.5)

## 2017-01-19 LAB — LACTATE DEHYDROGENASE: LDH: 202 IU/L (ref 119–226)

## 2017-01-20 ENCOUNTER — Telehealth: Payer: Self-pay | Admitting: *Deleted

## 2017-01-20 NOTE — Telephone Encounter (Signed)
-----   Message from Annia Belt, MD sent at 01/19/2017  7:31 AM EST ----- Call pt: platelets have drifted down to 64,000 but still in a safe range to continue to watch. Let's repeat again in 1 month

## 2017-01-20 NOTE — Telephone Encounter (Signed)
Called pt - no answer; left message "platelets have drifted down to 64,000 but still in a safe range to continue to watch. Let's repeat again in 1 month " per Dr Beryle Beams. And call back to schedule lab appt.

## 2017-02-15 ENCOUNTER — Telehealth: Payer: Self-pay | Admitting: *Deleted

## 2017-02-15 NOTE — Telephone Encounter (Signed)
Lab appt schedule this Friday @ 1000 AM.

## 2017-02-15 NOTE — Telephone Encounter (Signed)
-----   Message from Annia Belt, MD sent at 01/19/2017  7:31 AM EST ----- Call pt: platelets have drifted down to 64,000 but still in a safe range to continue to watch. Let's repeat again in 1 month

## 2017-02-17 ENCOUNTER — Other Ambulatory Visit: Payer: Self-pay | Admitting: Oncology

## 2017-02-17 DIAGNOSIS — D693 Immune thrombocytopenic purpura: Secondary | ICD-10-CM

## 2017-02-18 ENCOUNTER — Other Ambulatory Visit (INDEPENDENT_AMBULATORY_CARE_PROVIDER_SITE_OTHER): Payer: BLUE CROSS/BLUE SHIELD

## 2017-02-18 DIAGNOSIS — D693 Immune thrombocytopenic purpura: Secondary | ICD-10-CM | POA: Diagnosis not present

## 2017-02-19 LAB — CBC WITH DIFFERENTIAL/PLATELET
Basophils Absolute: 0.1 10*3/uL (ref 0.0–0.2)
Basos: 2 %
EOS (ABSOLUTE): 0.2 10*3/uL (ref 0.0–0.4)
Eos: 2 %
Hematocrit: 40.7 % (ref 34.0–46.6)
Hemoglobin: 13.6 g/dL (ref 11.1–15.9)
Immature Grans (Abs): 0 10*3/uL (ref 0.0–0.1)
Immature Granulocytes: 0 %
Lymphocytes Absolute: 1.7 10*3/uL (ref 0.7–3.1)
Lymphs: 26 %
MCH: 30.5 pg (ref 26.6–33.0)
MCHC: 33.4 g/dL (ref 31.5–35.7)
MCV: 91 fL (ref 79–97)
Monocytes Absolute: 0.8 10*3/uL (ref 0.1–0.9)
Monocytes: 13 %
Neutrophils Absolute: 3.7 10*3/uL (ref 1.4–7.0)
Neutrophils: 57 %
Platelets: 59 10*3/uL — CL (ref 150–379)
RBC: 4.46 x10E6/uL (ref 3.77–5.28)
RDW: 14.3 % (ref 12.3–15.4)
WBC: 6.6 10*3/uL (ref 3.4–10.8)

## 2017-02-21 ENCOUNTER — Telehealth: Payer: Self-pay | Admitting: *Deleted

## 2017-02-21 NOTE — Telephone Encounter (Signed)
-----   Message from Annia Belt, MD sent at 02/21/2017  8:29 AM EST ----- Call pt: platelets 59,000. OK to continue observation. I told doris to get her a follow up appt w me

## 2017-02-21 NOTE — Telephone Encounter (Signed)
Called pt - no answer; left message "platelets 59,000. OK to continue observation. " per Dr Beryle Beams. And Josephina Shih will be calling to schedule an appt and call for any questions.

## 2017-03-22 ENCOUNTER — Other Ambulatory Visit: Payer: Self-pay

## 2017-03-22 ENCOUNTER — Ambulatory Visit: Payer: BLUE CROSS/BLUE SHIELD | Admitting: Oncology

## 2017-03-22 ENCOUNTER — Encounter: Payer: Self-pay | Admitting: Oncology

## 2017-03-22 VITALS — BP 160/79 | HR 64 | Temp 97.7°F | Ht 67.0 in | Wt 147.6 lb

## 2017-03-22 DIAGNOSIS — L308 Other specified dermatitis: Secondary | ICD-10-CM | POA: Diagnosis not present

## 2017-03-22 DIAGNOSIS — Z853 Personal history of malignant neoplasm of breast: Secondary | ICD-10-CM

## 2017-03-22 DIAGNOSIS — Z923 Personal history of irradiation: Secondary | ICD-10-CM | POA: Diagnosis not present

## 2017-03-22 DIAGNOSIS — Z9049 Acquired absence of other specified parts of digestive tract: Secondary | ICD-10-CM | POA: Diagnosis not present

## 2017-03-22 DIAGNOSIS — Z9221 Personal history of antineoplastic chemotherapy: Secondary | ICD-10-CM

## 2017-03-22 DIAGNOSIS — D693 Immune thrombocytopenic purpura: Secondary | ICD-10-CM

## 2017-03-22 DIAGNOSIS — L309 Dermatitis, unspecified: Secondary | ICD-10-CM | POA: Insufficient documentation

## 2017-03-22 NOTE — Patient Instructions (Signed)
To lab today Continue CBC every 2 months MD visit 6 months

## 2017-03-23 ENCOUNTER — Encounter: Payer: Self-pay | Admitting: Oncology

## 2017-03-23 ENCOUNTER — Other Ambulatory Visit: Payer: Self-pay | Admitting: Oncology

## 2017-03-23 DIAGNOSIS — D693 Immune thrombocytopenic purpura: Secondary | ICD-10-CM

## 2017-03-23 LAB — CBC WITH DIFFERENTIAL/PLATELET
Basophils Absolute: 0.1 10*3/uL (ref 0.0–0.2)
Basos: 1 %
EOS (ABSOLUTE): 0.2 10*3/uL (ref 0.0–0.4)
Eos: 3 %
Hematocrit: 43.7 % (ref 34.0–46.6)
Hemoglobin: 14.5 g/dL (ref 11.1–15.9)
Immature Grans (Abs): 0 10*3/uL (ref 0.0–0.1)
Immature Granulocytes: 0 %
Lymphocytes Absolute: 2.4 10*3/uL (ref 0.7–3.1)
Lymphs: 32 %
MCH: 30.7 pg (ref 26.6–33.0)
MCHC: 33.2 g/dL (ref 31.5–35.7)
MCV: 92 fL (ref 79–97)
Monocytes Absolute: 1 10*3/uL — ABNORMAL HIGH (ref 0.1–0.9)
Monocytes: 14 %
Neutrophils Absolute: 3.8 10*3/uL (ref 1.4–7.0)
Neutrophils: 50 %
Platelets: 25 10*3/uL — CL (ref 150–379)
RBC: 4.73 x10E6/uL (ref 3.77–5.28)
RDW: 14.3 % (ref 12.3–15.4)
WBC: 7.5 10*3/uL (ref 3.4–10.8)

## 2017-03-23 LAB — ANTINUCLEAR ANTIBODIES, IFA: ANA Titer 1: NEGATIVE

## 2017-03-23 LAB — SEDIMENTATION RATE: Sed Rate: 7 mm/hr (ref 0–40)

## 2017-03-23 MED ORDER — PREDNISONE 20 MG PO TABS: 60.0000 mg | ORAL_TABLET | Freq: Every day | ORAL | 0 refills | Status: AC

## 2017-03-23 NOTE — Progress Notes (Addendum)
Hematology and Oncology Follow Up Visit  Yvonne Hoover [id number] [birth year] 57 y.o. 03/23/2017 11:39 AM   Principle Diagnosis: Encounter Diagnoses  Name Primary?  . 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 History of breast cancer in female   . Dermatitis   Clinical summary: 57 year old woman I have followed for many years at the cancer center.  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 She was put on N-plate given between May 2009 and November 2012 and had anexcellent response. She was changed to [facility] in September 2012. She has also achieved a sustained response with platelet count consistently over 100,000. She continued on long-term Promacta until February 2018.  There were data at that time that suggested a subset of patients might be able to maintain a remission off drug.  I stop the Promacta and she has been able to maintain a prolonged partial remission.  Platelet counts have been consistently at or above 50,000 with few exceptions.  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 Her breast cancerwas treated with lumpectomy. Tumor was close to the nipple areolar complex which was removed with the surgery. She received postop radiation. She was put on tamoxifen hormonal therapy which she took for 5 years through June 2015. She had an endometrial biopsy 10/29/2012 for dysfunctional uterine bleeding. No signs of malignancy. Tamoxifen resumed. Subsequently stopped as noted above. She elected not to transition to an aromatase inhibitor or extend tamoxifen therapy for an additional 5 years. She continues to get annual  mammograms.    Interim History:   She has developed an angry eczematous rash on the dorsum of her right hand which started back in January.  Over the last 4 or 5 days she has also developed a punctate rash on the bib the area of her chest.  It is very pruritic.  She failed a trial of steroids by her primary care physician.  Dermatology referral in progress. She has no clinical bleeding.  Unfortunately, platelet count came back today at 25,000 after the patient had already left the clinic.  I am not sure if the appearance of this rash has any relationship but her platelet count has been very stable for a year now off platelet stimulating drugs so I suspect that there is a connection.  I am checking ANA today.  Her sed rate is only 7 mm.  Hemoglobin stable at 14.5.  White count 7500 with a normal differential.  Chronic mild increase in monocytes currently at 14%.  She has noticed no new lumps in her breasts.  Last mammogram in August 2018 unchanged.  I will go ahead and schedule her next mammography for this coming August.  Her Daughter is doing well.  She is in [place].  Her son age 68 has an idiopathic seizure disorder but was recently diagnosed with something called  MOG which is apparently an autoimmune type of process for which she is receiving intravenous immunoglobulin.  Medications: reviewed  Allergies: No Known Allergies  Review of Systems: See interim history Remaining ROS negative:   Physical Exam: Blood pressure (!) 160/79, pulse 64, temperature 97.7 F (36.5 C), temperature source Oral, height '5\' 7"'  (1.702 m), weight 147 lb 9.6 oz (  67 kg), last menstrual period 06/05/2014, SpO2 100 %. Wt Readings from Last 3 Encounters:  03/22/17 147 lb 9.6 oz (67 kg)  06/05/16 145 lb 11.6 oz (66.1 kg)  03/24/16 150 lb (68 kg)     General appearance: Well-nourished Caucasian woman HENNT: Pharynx no erythema, exudate, mass, or ulcer. No thyromegaly or thyroid nodules Lymph nodes: No  cervical, supraclavicular, or axillary lymphadenopathy Breasts: Status post previous lumpectomy with removal of the nipple areolar complex on the right breast.  No abnormal skin changes, no dominant mass in either breast Lungs: Clear to auscultation, resonant to percussion throughout Heart: Regular rhythm, no murmur, no gallop, no rub, no click, no edema Abdomen: Soft, nontender, normal bowel sounds, no mass, no organomegaly.  Status post prior splenectomy. Extremities: No edema, no calf tenderness Musculoskeletal: no joint deformities GU:  Vascular: Carotid pulses 2+, no bruits, symmetric Neurologic: Alert, oriented, PERRLA, optic discs sharp and vessels normal, no hemorrhage or exudate, cranial nerves grossly normal, motor strength 5 over 5, reflexes 1+ symmetric, upper body coordination normal, gait normal, Skin: Eczematous rash covering the dorsum of her right hand.  Additional punctate rash in the rib area but not on the back or extremities.  No ecchymosis         Media Information   Document Information   Photos    03/22/2017 14:35  Attached To:  Ronelle Nigh "[email or web address]"  Source Information   Annia Belt, MD  [facility]               Lab Results: CBC W/Diff    Component Value Date/Time   WBC 7.5 03/22/2017 1509   WBC 7.9 06/06/2016 0601   RBC 4.73 03/22/2017 1509   RBC 4.45 06/06/2016 0601   HGB 14.5 03/22/2017 1509   HGB 14.1 01/02/2015 0825   HCT 43.7 03/22/2017 1509   HCT 43.7 01/02/2015 0825   PLT 25 (LL) 03/22/2017 1509   MCV 92 03/22/2017 1509   MCV 91.3 01/02/2015 0825   MCH 30.7 03/22/2017 1509   MCH 30.1 06/06/2016 0601   MCHC 33.2 03/22/2017 1509   MCHC 33.4 06/06/2016 0601   RDW 14.3 03/22/2017 1509   RDW 14.2 01/02/2015 0825   LYMPHSABS 2.4 03/22/2017 1509   LYMPHSABS 2.1 01/02/2015 0825   MONOABS 0.7 06/04/2016 1816   MONOABS 0.9 01/02/2015 0825   EOSABS 0.2 03/22/2017 1509   BASOSABS 0.1 03/22/2017 1509    BASOSABS 0.2 (H) 01/02/2015 0825     Chemistry      Component Value Date/Time   NA 144 01/18/2017 1015   NA 141 12/20/2013 1004   K 3.9 01/18/2017 1015   K 4.1 12/20/2013 1004   CL 105 01/18/2017 1015   CL 107 02/16/2012 1436   CO2 24 01/18/2017 1015   CO2 28 12/20/2013 1004   BUN 11 01/18/2017 1015   BUN 6.9 (L) 12/20/2013 1004   CREATININE 0.75 01/18/2017 1015   CREATININE 0.7 12/20/2013 1004      Component Value Date/Time   CALCIUM 9.2 01/18/2017 1015   CALCIUM 9.3 12/20/2013 1004   ALKPHOS 73 01/18/2017 1015   ALKPHOS 73 12/20/2013 1004   AST 18 01/18/2017 1015   AST 19 12/20/2013 1004   ALT 11 01/18/2017 1015   ALT 12 12/20/2013 1004   BILITOT 0.3 01/18/2017 1015   BILITOT 0.39 12/20/2013 1004       Radiological Studies: No results found.  Impression:  1.  Chronic ITP Relapse today may  be related to development of an atypical rash. I am going to put her back on some steroids at this time while further evaluation and progress.  If this is just transient decompensation due to the dermatitis that should improve.  If not, given her previous failure of multiple therapies including splenectomy, I will have to put her back on thrombopoietin.  2.  Idiopathic dermatitis Evaluation in progress.  Check ANA today.  She has no joint symptoms.  No neurologic symptoms.  3.  Stage I, ER positive, HER-2 equivocal cancer of the right breast diagnosed March 2009 treated with surgery plus hormonal therapy.  She remains free of any obvious recurrence now about 10 years.  CC: Patient Care Team: Lysle Pearl, MD as PCP - General (Family Medicine)   Murriel Hopper, MD, [place]  Hematology-Oncology/Internal Medicine     3/20/201911:39 AM

## 2017-03-23 NOTE — Progress Notes (Signed)
Hematology: I discussed sudden fall and platelet count of 25,000 with the patient.  This is occurred coincidentally with a idiopathic extensive dermatitis.  The 2 may be related.  I am prescribing prednisone 60 mg daily.  We will check a platelet count again early next week.  She is advised to avoid all strenuous activity and use common sense with respect to not climbing up any ladders etc.  I am hoping that the steroids will also help treat the rash as well as reinduce a partial remission of her ITP.

## 2017-03-24 ENCOUNTER — Telehealth: Payer: Self-pay | Admitting: *Deleted

## 2017-03-24 NOTE — Telephone Encounter (Signed)
Called pt - no answer; left message to give me a call back. 

## 2017-03-24 NOTE — Telephone Encounter (Signed)
-----  Message from Annia Belt, MD sent at 03/23/2017  2:48 PM EDT ----- Call pt: ANA screen for lupus negative, ESR: red cell sedimentation rate, indicator of acute or chronic inflammation, normal: together theses tests argue against an auto-immune reason for her rash. She should be coming in Tuesday next week to check a CBC. I will put in order.

## 2017-03-25 ENCOUNTER — Ambulatory Visit: Payer: BLUE CROSS/BLUE SHIELD | Admitting: Certified Nurse Midwife

## 2017-03-25 NOTE — Telephone Encounter (Signed)
Called pt again - no answer; left message on VM" ANA screen for lupus negative, ESR: red cell sedimentation rate, indicator of acute or chronic inflammation, normal: together theses tests argue against an auto-immune reason for her rash.  She should be coming in Tuesday next week to check a CBC." per Dr Beryle Beams. And call for any questions. Per Epic , lab appt has been scheduled for Tuesday.

## 2017-03-29 ENCOUNTER — Other Ambulatory Visit (INDEPENDENT_AMBULATORY_CARE_PROVIDER_SITE_OTHER): Payer: BLUE CROSS/BLUE SHIELD

## 2017-03-29 ENCOUNTER — Telehealth: Payer: Self-pay | Admitting: *Deleted

## 2017-03-29 DIAGNOSIS — D693 Immune thrombocytopenic purpura: Secondary | ICD-10-CM | POA: Diagnosis not present

## 2017-03-29 LAB — CBC WITH DIFFERENTIAL/PLATELET
Basophils Absolute: 0.1 10*3/uL (ref 0.0–0.1)
Basophils Relative: 0 %
Eosinophils Absolute: 0.1 10*3/uL (ref 0.0–0.7)
Eosinophils Relative: 1 %
HCT: 39.6 % (ref 36.0–46.0)
Hemoglobin: 13 g/dL (ref 12.0–15.0)
Lymphocytes Relative: 16 %
Lymphs Abs: 2.1 10*3/uL (ref 0.7–4.0)
MCH: 30.1 pg (ref 26.0–34.0)
MCHC: 32.8 g/dL (ref 30.0–36.0)
MCV: 91.7 fL (ref 78.0–100.0)
Monocytes Absolute: 0.6 10*3/uL (ref 0.1–1.0)
Monocytes Relative: 5 %
Neutro Abs: 10 10*3/uL — ABNORMAL HIGH (ref 1.7–7.7)
Neutrophils Relative %: 78 %
Platelets: 200 10*3/uL (ref 150–400)
RBC: 4.32 MIL/uL (ref 3.87–5.11)
RDW: 13.9 % (ref 11.5–15.5)
WBC: 12.9 10*3/uL — ABNORMAL HIGH (ref 4.0–10.5)

## 2017-03-29 NOTE — Telephone Encounter (Signed)
Pt called/informed "platelets 200,000! Decrease prednisone to 2 tabs = 40 mg daily. Repeat CBC in 1 week" per Dr Beryle Beams. Repeated instructions. Lab appt scheduled April 2 at [place].

## 2017-03-29 NOTE — Telephone Encounter (Signed)
-----   Message from Annia Belt, MD sent at 03/29/2017 11:52 AM EDT ----- Call pt: platelets 200,000! Decrease prednisone to 2 tabs = 40 mg daily. Repeat CBC in 1 week

## 2017-04-05 ENCOUNTER — Other Ambulatory Visit (INDEPENDENT_AMBULATORY_CARE_PROVIDER_SITE_OTHER): Payer: BLUE CROSS/BLUE SHIELD

## 2017-04-05 ENCOUNTER — Telehealth: Payer: Self-pay | Admitting: *Deleted

## 2017-04-05 DIAGNOSIS — D693 Immune thrombocytopenic purpura: Secondary | ICD-10-CM | POA: Diagnosis not present

## 2017-04-05 LAB — CBC WITH DIFFERENTIAL/PLATELET
Basophils Absolute: 0 10*3/uL (ref 0.0–0.1)
Basophils Relative: 0 %
Eosinophils Absolute: 0.1 10*3/uL (ref 0.0–0.7)
Eosinophils Relative: 1 %
HCT: 40.1 % (ref 36.0–46.0)
Hemoglobin: 13.1 g/dL (ref 12.0–15.0)
Lymphocytes Relative: 41 %
Lymphs Abs: 4.8 10*3/uL — ABNORMAL HIGH (ref 0.7–4.0)
MCH: 30.8 pg (ref 26.0–34.0)
MCHC: 32.7 g/dL (ref 30.0–36.0)
MCV: 94.4 fL (ref 78.0–100.0)
Monocytes Absolute: 0.9 10*3/uL (ref 0.1–1.0)
Monocytes Relative: 8 %
Neutro Abs: 6 10*3/uL (ref 1.7–7.7)
Neutrophils Relative %: 50 %
Platelets: 247 10*3/uL (ref 150–400)
RBC: 4.25 MIL/uL (ref 3.87–5.11)
RDW: 14.5 % (ref 11.5–15.5)
WBC: 11.8 10*3/uL — ABNORMAL HIGH (ref 4.0–10.5)

## 2017-04-05 NOTE — Telephone Encounter (Signed)
-----   Message from Annia Belt, MD sent at 04/05/2017 10:05 AM EDT ----- Call pt: platelets 247,000!  Can decrease prednisone to 20 mg daily. Repeat CBC in 2 weeks - will continue to taper off prednisone slowly

## 2017-04-05 NOTE — Telephone Encounter (Signed)
Called pt - no answer; left message; "platelets 247,000! Can decrease prednisone to 20 mg daily. Repeat CBC in 2 weeks - will continue to taper off prednisone slowly " per Dr Beryle Beams. And call back to schedule next lab appt and for any questions.

## 2017-04-06 ENCOUNTER — Encounter: Payer: Self-pay | Admitting: Certified Nurse Midwife

## 2017-04-06 ENCOUNTER — Ambulatory Visit: Payer: BLUE CROSS/BLUE SHIELD | Admitting: Certified Nurse Midwife

## 2017-04-06 NOTE — Progress Notes (Deleted)
57 y.o. [id number] Divorced  {Race/ethnicity:17218} Fe here for annual exam.    Patient's last menstrual period was 06/05/2014.          Sexually active: {yes no:314532}  The current method of family planning is post menopausal status.    Exercising: {yes no:314532}  {types:19826} Smoker:  {YES NO:22349}  Health Maintenance: Pap:  03-18-14 neg HPV HR neg, 03-19-15 neg History of Abnormal Pap: {YES NO:22349} MMG:  08-18-15 category b density birads 1:neg Self Breast exams: {YES NO:22349} Colonoscopy:  2017 f/u 55yr BMD:   none TDaP:  2015 Shingles: *** Pneumonia: 2017 Hep C and HIV: HIV neg 2018 Labs: ***   reports that she quit smoking about 4 years ago. She has never used smokeless tobacco. She reports that she drinks alcohol. She reports that she does not use drugs.  Past Medical History:  Diagnosis Date  . Abnormal uterine bleeding   . Alcohol abuse, in remission   . Bronchiolitis   . Cancer ([place] 07[place]2007   breast  . Cellulitis   . Depression   . Depression with anxiety 11/16/2010  . Fibroid 1992  . ITP (idiopathic thrombocytopenic purpura)    chronic  . STD (sexually transmitted disease)    positive HPV    Past Surgical History:  Procedure Laterality Date  . BONE MARROW BIOPSY  09/2000   back hip  . BREAST SURGERY  01/2000    left breast mass/ benign  . ear bone surgery    . ENDOMETRIAL BIOPSY      Current Outpatient Medications  Medication Sig Dispense Refill  . citalopram (CELEXA) 40 MG tablet TAKE ONE TABLET BY MOUTH DAILY.    .Marland KitchenpredniSONE (DELTASONE) 20 MG tablet Take 3 tablets (60 mg total) by mouth daily with breakfast. 90 tablet 0   No current facility-administered medications for this visit.     Family History  Problem Relation Age of Onset  . Hypertension Mother   . Thyroid disease Mother   . Irregular heart beat Father   . Breast cancer Maternal Aunt   . Brain cancer Maternal Aunt   . Tuberculosis Maternal Grandmother   . Diabetes Paternal  Grandmother   . Cancer Maternal Grandfather        lung  . Uterine cancer Maternal Aunt     ROS:  Pertinent items are noted in HPI.  Otherwise, a comprehensive ROS was negative.  Exam:   LMP 06/05/2014 Comment: postmenopausal bleeding in 06/05/14   Ht Readings from Last 3 Encounters:  03/22/17 _0  (1.702 m)  06/05/16 _1  (1.702 m)  03/24/16 5' 6.75" (1.695 m)    General appearance: alert, cooperative and appears stated age Head: Normocephalic, without obvious abnormality, atraumatic Neck: no adenopathy, supple, symmetrical, trachea midline and thyroid {EXAM; THYROID:18604} Lungs: clear to auscultation bilaterally Breasts: {Exam; breast:13139::"normal appearance, no masses or tenderness"} Heart: regular rate and rhythm Abdomen: soft, non-tender; no masses,  no organomegaly Extremities: extremities normal, atraumatic, no cyanosis or edema Skin: Skin color, texture, turgor normal. No rashes or lesions Lymph nodes: Cervical, supraclavicular, and axillary nodes normal. No abnormal inguinal nodes palpated Neurologic: Grossly normal   Pelvic: External genitalia:  no lesions              Urethra:  normal appearing urethra with no masses, tenderness or lesions              Bartholin's and Skene's: normal  Vagina: normal appearing vagina with normal color and discharge, no lesions              Cervix: {exam; cervix:14595}              Pap taken: {yes no:314532} Bimanual Exam:  Uterus:  {exam; uterus:12215}              Adnexa: {exam; adnexa:12223}               Rectovaginal: Confirms               Anus:  normal sphincter tone, no lesions  Chaperone present: ***  A:  Well Woman with normal exam  P:   Reviewed health and wellness pertinent to exam  Pap smear: {YES NO:22349}  {plan; gyn:5269::"mammogram","pap smear","return annually or prn"}  An After Visit Summary was printed and given to the patient.

## 2017-04-07 NOTE — Telephone Encounter (Signed)
OK - thanks

## 2017-04-07 NOTE — Telephone Encounter (Addendum)
Talked to pt- stated she did receive my call/message from Tuesday about her result and decreasing Prednisone to 20 mg daily. Scheduled next lab appt Tuesday 4/16@ 1000 AM. She stated the rash is back and looks worse than before.

## 2017-04-07 NOTE — Telephone Encounter (Signed)
Did she ever get to the dermatologist?

## 2017-04-07 NOTE — Telephone Encounter (Signed)
Pt states she has an appt next week to see the dermatologist.

## 2017-04-19 ENCOUNTER — Other Ambulatory Visit: Payer: Self-pay | Admitting: Oncology

## 2017-04-19 ENCOUNTER — Other Ambulatory Visit (INDEPENDENT_AMBULATORY_CARE_PROVIDER_SITE_OTHER): Payer: BLUE CROSS/BLUE SHIELD

## 2017-04-19 DIAGNOSIS — D693 Immune thrombocytopenic purpura: Secondary | ICD-10-CM

## 2017-04-19 LAB — CBC WITH DIFFERENTIAL/PLATELET
Basophils Absolute: 0.1 10*3/uL (ref 0.0–0.1)
Basophils Relative: 1 %
Eosinophils Absolute: 0.3 10*3/uL (ref 0.0–0.7)
Eosinophils Relative: 2 %
HCT: 40.3 % (ref 36.0–46.0)
Hemoglobin: 13.3 g/dL (ref 12.0–15.0)
Lymphocytes Relative: 30 %
Lymphs Abs: 3.2 10*3/uL (ref 0.7–4.0)
MCH: 30.5 pg (ref 26.0–34.0)
MCHC: 33 g/dL (ref 30.0–36.0)
MCV: 92.4 fL (ref 78.0–100.0)
Monocytes Absolute: 1.3 10*3/uL — ABNORMAL HIGH (ref 0.1–1.0)
Monocytes Relative: 12 %
Neutro Abs: 6.1 10*3/uL (ref 1.7–7.7)
Neutrophils Relative %: 55 %
Platelets: 55 10*3/uL — ABNORMAL LOW (ref 150–400)
RBC: 4.36 MIL/uL (ref 3.87–5.11)
RDW: 14.2 % (ref 11.5–15.5)
WBC: 10.8 10*3/uL — ABNORMAL HIGH (ref 4.0–10.5)

## 2017-04-27 ENCOUNTER — Other Ambulatory Visit (INDEPENDENT_AMBULATORY_CARE_PROVIDER_SITE_OTHER): Payer: BLUE CROSS/BLUE SHIELD

## 2017-04-27 DIAGNOSIS — D693 Immune thrombocytopenic purpura: Secondary | ICD-10-CM

## 2017-04-27 LAB — CBC WITH DIFFERENTIAL/PLATELET
Basophils Absolute: 0.1 10*3/uL (ref 0.0–0.1)
Basophils Relative: 0 %
Eosinophils Absolute: 0 10*3/uL (ref 0.0–0.7)
Eosinophils Relative: 0 %
HCT: 42.2 % (ref 36.0–46.0)
Hemoglobin: 13.9 g/dL (ref 12.0–15.0)
Lymphocytes Relative: 11 %
Lymphs Abs: 1.3 10*3/uL (ref 0.7–4.0)
MCH: 30.6 pg (ref 26.0–34.0)
MCHC: 32.9 g/dL (ref 30.0–36.0)
MCV: 93 fL (ref 78.0–100.0)
Monocytes Absolute: 0.4 10*3/uL (ref 0.1–1.0)
Monocytes Relative: 3 %
Neutro Abs: 10.6 10*3/uL — ABNORMAL HIGH (ref 1.7–7.7)
Neutrophils Relative %: 86 %
Platelets: 127 10*3/uL — ABNORMAL LOW (ref 150–400)
RBC: 4.54 MIL/uL (ref 3.87–5.11)
RDW: 14.3 % (ref 11.5–15.5)
WBC: 12.3 10*3/uL — ABNORMAL HIGH (ref 4.0–10.5)

## 2017-04-27 NOTE — Addendum Note (Signed)
Addended by: Truddie Crumble on: 04/27/2017 02:23 PM   Modules accepted: Orders

## 2017-04-28 ENCOUNTER — Telehealth: Payer: Self-pay | Admitting: *Deleted

## 2017-04-28 NOTE — Telephone Encounter (Signed)
Pt called - no answer; left message "platelets up to 127,000! Can continue to taper steroids " per Dr Beryle Beams. Also to call me back about how she's taking her steroids now.

## 2017-04-28 NOTE — Telephone Encounter (Signed)
-----   Message from Yvonne Belt, MD sent at 04/27/2017  3:26 PM EDT ----- Call pt: platelets up to 127,000! Can continue to taper steroids - I forgot what I told her on phone last week

## 2017-05-03 NOTE — Telephone Encounter (Signed)
I texted her - just finish 5 days then stop

## 2017-05-03 NOTE — Telephone Encounter (Signed)
Talked to pt -currently she's on 5 mg of Prednisone which it has almost been a week since she tapered down to 5 mg. She wanted to know if she needs to do anything else I.e. labs?

## 2017-05-05 ENCOUNTER — Ambulatory Visit (INDEPENDENT_AMBULATORY_CARE_PROVIDER_SITE_OTHER): Payer: BLUE CROSS/BLUE SHIELD | Admitting: Certified Nurse Midwife

## 2017-05-05 ENCOUNTER — Encounter: Payer: Self-pay | Admitting: Certified Nurse Midwife

## 2017-05-05 ENCOUNTER — Other Ambulatory Visit (HOSPITAL_COMMUNITY)
Admission: RE | Admit: 2017-05-05 | Discharge: 2017-05-05 | Disposition: A | Discharge status: Home / Self Care | Payer: BLUE CROSS/BLUE SHIELD | Source: Ambulatory Visit | Attending: Certified Nurse Midwife | Admitting: Certified Nurse Midwife

## 2017-05-05 ENCOUNTER — Other Ambulatory Visit: Payer: Self-pay

## 2017-05-05 VITALS — BP 120/70 | HR 70 | Resp 16 | Ht 66.75 in | Wt 146.0 lb

## 2017-05-05 DIAGNOSIS — Z853 Personal history of malignant neoplasm of breast: Secondary | ICD-10-CM

## 2017-05-05 DIAGNOSIS — N6310 Unspecified lump in the right breast, unspecified quadrant: Secondary | ICD-10-CM | POA: Diagnosis not present

## 2017-05-05 DIAGNOSIS — Z01411 Encounter for gynecological examination (general) (routine) with abnormal findings: Secondary | ICD-10-CM | POA: Insufficient documentation

## 2017-05-05 DIAGNOSIS — L309 Dermatitis, unspecified: Secondary | ICD-10-CM | POA: Diagnosis not present

## 2017-05-05 DIAGNOSIS — Z124 Encounter for screening for malignant neoplasm of cervix: Secondary | ICD-10-CM

## 2017-05-05 NOTE — Progress Notes (Signed)
Spoke with Janett Billow at [place]. Patient will go directly to [facility], will be worked in today for bilateral DX MMG and [place], if needed. Copy of order given to patient and copy of order faxed to [facility]. Patient verbalizes understanding and is agreeable.

## 2017-05-05 NOTE — Progress Notes (Signed)
57 y.o. G7P2002 Divorced  Caucasian Fe here for annual exam. Menopausal no HRT. Denies vaginal bleeding, some vaginal dryness. Dealing with right had dermatitis, seeing doing acupuncture with some change.  Has not seen dermatology yet. "On my list". Still seeing Dr. Beryle Beams for management ITTP off medication now and feel acupuncture has helped. Had E.Coli infection with ER infection. Sees Dr. Sandford Craze for aex and saw Dr. Collene Mares for follow up with E. Coli, all good. No change in partner, no STD concerns. No other health issues today.  Patient's last menstrual period was 06/05/2014.          Sexually active: Yes.    The current method of family planning is post menopausal status.    Exercising: Yes.    walking Smoker:  no  Health Maintenance: Pap:  03-19-15 neg History of Abnormal Pap: in her 20's, just repeat done MMG:  8/17 birads 1:neg Self Breast exams: yes Colonoscopy:  2017 f/u 34yr BMD:   none TDaP:  2015 Shingles: no Pneumonia: 2017 Hep C and HIV: HIV neg 2018 Labs: no   reports that she quit smoking about 4 years ago. She has never used smokeless tobacco. She reports that she drinks alcohol. She reports that she does not use drugs.  Past Medical History:  Diagnosis Date  . Abnormal uterine bleeding   . Alcohol abuse, in remission   . Bronchiolitis   . [place]   breast  . Cellulitis   . Depression   . Depression with anxiety 11/16/2010  . Fibroid 1992  . ITP (idiopathic thrombocytopenic purpura)    chronic  . STD (sexually transmitted disease)    positive HPV    Past Surgical History:  Procedure Laterality Date  . BONE MARROW BIOPSY  09/2000   back hip  . BREAST SURGERY  01/2000    left breast mass/ benign  . ear bone surgery    . ENDOMETRIAL BIOPSY      Current Outpatient Medications  Medication Sig Dispense Refill  . buPROPion (WELLBUTRIN XL) 150 MG 24 hr tablet TAKE ONE TABLET (150 MG TOTAL) BY MOUTH DAILY. ONE DAILY FOR ENERGY.  1  . citalopram  (CELEXA) 40 MG tablet TAKE ONE TABLET BY MOUTH DAILY.     No current facility-administered medications for this visit.     Family History  Problem Relation Age of Onset  . Hypertension Mother   . Thyroid disease Mother   . Irregular heart beat Father   . Breast cancer Maternal Aunt   . Brain cancer Maternal Aunt   . Tuberculosis Maternal Grandmother   . Diabetes Paternal Grandmother   . Cancer Maternal Grandfather        lung  . Uterine cancer Maternal Aunt     ROS:  Pertinent items are noted in HPI.  Otherwise, a comprehensive ROS was negative.  Exam:   BP 120/70   Pulse 70   Resp 16   Ht 5' 6.75" (1.695 m)   Wt 146 lb (66.2 kg)   LMP 06/05/2014 Comment: postmenopausal bleeding in 06/05/14  BMI 23.04 kg/m  Height: 5' 6.75" (169.5 cm) Ht Readings from Last 3 Encounters:  05/05/17 5' 6.75" (1.695 m)  03/22/17 _0  (1.702 m)  06/05/16 _1  (1.702 m)    General appearance: alert, cooperative and appears stated age Head: Normocephalic, without obvious abnormality, atraumatic Neck: no adenopathy, supple, symmetrical, trachea midline and thyroid normal to inspection and palpation Lungs: clear to auscultation bilaterally Breasts: normal appearance, no  masses or tenderness, No nipple retraction or dimpling, No nipple discharge or bleeding, No axillary or supraclavicular adenopathy, right breast at 9-10 o'clock bb size mass noted below previous lumpectomy scar, non tender, not mobile, marked, ,patient had  not noted Heart: regular rate and rhythm Abdomen: soft, non-tender; no masses,  no organomegaly Extremities: extremities normal, atraumatic, no cyanosis or edema Skin: Skin color, texture, turgor normal. No rashes or lesions Lymph nodes: Cervical, supraclavicular, and axillary nodes normal. No abnormal inguinal nodes palpated Neurologic: Grossly normal   Pelvic: External genitalia:  no lesions              Urethra:  normal appearing urethra with no masses, tenderness or  lesions              Bartholin's and Skene's: normal                 Vagina: normal appearing vagina with normal color and discharge, no lesions              Cervix: no cervical motion tenderness, no lesions and normal appearance              Pap taken: Yes.   Bimanual Exam:  Uterus:  normal size, contour, position, consistency, mobility, non-tender and anteverted              Adnexa: normal adnexa and no mass, fullness, tenderness               Rectovaginal: Confirms               Anus:  normal sphincter tone, no lesions  Chaperone present: yes  A:  Well Woman with normal exam  Post menopausal no HRT  Previous breast cancer in right breast  Right ?breast mass  History of ITTP not stable seeing Oncology  Hand dermatitis ? prolonged  P:   Reviewed health and wellness pertinent to exam  Aware of need to advise if vaginal bleeding  Stressed SBE,, discussed area of concern under scarring and feel need diagnostic mammogram with [place] for evaluation. Patient agreeable, will schedule prior to leaving today.  Continue follow up with ITTP as indicated  Stressed importance of having dermatology evaluate hand due to prolonged occurrence. Patient given dermatology information.Will consider.  Pap smear: yes   counseled on breast self exam, mammography screening, adequate intake of calcium and vitamin D, diet and exercise, Kegel's exercises  return annually or prn  An After Visit Summary was printed and given to the patient.

## 2017-05-06 LAB — CYTOLOGY - PAP
Diagnosis: NEGATIVE
HPV 16/18/45 genotyping: POSITIVE — AB
HPV: DETECTED — AB

## 2017-05-06 NOTE — Addendum Note (Signed)
Addended by: Regina Eck on: 05/06/2017 02:16 PM   Modules accepted: Orders

## 2017-05-10 ENCOUNTER — Other Ambulatory Visit: Payer: Self-pay | Admitting: Certified Nurse Midwife

## 2017-05-10 ENCOUNTER — Telehealth: Payer: Self-pay | Admitting: *Deleted

## 2017-05-10 DIAGNOSIS — R87810 Cervical high risk human papillomavirus (HPV) DNA test positive: Secondary | ICD-10-CM

## 2017-05-10 NOTE — Telephone Encounter (Signed)
Notes recorded by Burnice Logan, RN on 05/10/2017 at 10:44 AM EDT Left message to call Sharee Pimple at [phone].

## 2017-05-10 NOTE — Telephone Encounter (Signed)
-----   Message from Regina Eck, CNM sent at 05/10/2017  7:58 AM EDT ----- Notify patient that her pap smear is negative but HPV positive and HPV typing was positive for 18/45, if 18 positive needs colposcopy for evaluation. Please schedule. Order placed

## 2017-05-16 NOTE — Telephone Encounter (Signed)
Spoke with patient. Advised as seen below per Melvia Heaps, CNM. Patient is postmenopausal. Colpo scheduled for 5/15 at 2pm with Melvia Heaps, CNM. Brief explanation of colpo provided, advised to take Motrin 800 mg with food and water one hour before procedure. Patient is aware will be called to review benefits prior to procedure.   Routing to provider for final review. Patient is agreeable to disposition. Will close encounter.  Cc: Magdalene Patricia, [address] [employer]

## 2017-05-18 ENCOUNTER — Other Ambulatory Visit: Payer: Self-pay

## 2017-05-18 ENCOUNTER — Ambulatory Visit: Payer: BLUE CROSS/BLUE SHIELD | Admitting: Certified Nurse Midwife

## 2017-05-18 ENCOUNTER — Telehealth: Payer: Self-pay | Admitting: Certified Nurse Midwife

## 2017-05-18 ENCOUNTER — Encounter: Payer: Self-pay | Admitting: Certified Nurse Midwife

## 2017-05-18 VITALS — BP 140/80 | HR 72 | Resp 16 | Ht 66.75 in | Wt 145.0 lb

## 2017-05-18 DIAGNOSIS — R87810 Cervical high risk human papillomavirus (HPV) DNA test positive: Secondary | ICD-10-CM | POA: Diagnosis not present

## 2017-05-18 NOTE — Telephone Encounter (Signed)
Patient has some questions about her colposcopy procedure today.

## 2017-05-18 NOTE — Progress Notes (Signed)
05-05-17 neg HPV HR +, 18/45 +

## 2017-05-18 NOTE — Telephone Encounter (Signed)
Reviewed with Melvia Heaps, CNM. Keep colpo as scheduled, will check b/p prior, will evaluate patient prior to procedure.   Routing to provider for final review. Patient is agreeable to disposition. Will close encounter.

## 2017-05-18 NOTE — Telephone Encounter (Signed)
Spoke with patient. Reports Hx of ITP, last platelet count on 04/27/17 127. Patient states she has reviewed with hemotologist, "felt it was ok to proceed, did not recommend motrin". RN advised patient can take tylenol instead of motrin.   Patient reports she has been checking her b/p at home the past couple of days, reports 151/88 and 135/81. Asking if ok to proceed?   RN advised to keep colpo as scheduled, will check b/p prior to procedure, will review with Melvia Heaps, CNM and return call with any additional recommendations. Patient verbalizes understanding and is agreeable.

## 2017-05-18 NOTE — Patient Instructions (Signed)

## 2017-05-18 NOTE — Progress Notes (Signed)
Patient ID: Yvonne Hoover, female   DOB: [birth year], 57 y.o.   MRN: [id number]  Chief Complaint  Patient presents with  . Colposcopy    //jj    HPI  57 yo white female g1 p1001 here for colposcopy exam . Denies vaginal bleeding or pelvic pain. Patient has long history of ITTP with last platelet count 127  On 59/24/19.    Indications: Pap smear on May2 2019 showed: pap smear negative, HPV detected, genotype positive for 18,45. No previous history of abnormal pap smear.  Past Medical History:  Diagnosis Date  . Abnormal Pap smear of cervix    in her 59's, just repeat done, 2019 neg HPV HR +, 18/45 +  . Abnormal uterine bleeding   . Alcohol abuse, in remission   . Bronchiolitis   . Cancer ([place]) 07/2007   breast  . Cellulitis   . Depression   . Depression with anxiety 11/16/2010  . Fibroid 1992  . ITP (idiopathic thrombocytopenic purpura)    chronic  . STD (sexually transmitted disease)    positive HPV    Past Surgical History:  Procedure Laterality Date  . BONE MARROW BIOPSY  09/2000   back hip  . BREAST SURGERY  01/2000    left breast mass/ benign  . ear bone surgery    . ENDOMETRIAL BIOPSY      Family History  Problem Relation Age of Onset  . Hypertension Mother   . Thyroid disease Mother   . Irregular heart beat Father   . Breast cancer Maternal Aunt   . Brain cancer Maternal Aunt   . Tuberculosis Maternal Grandmother   . Diabetes Paternal Grandmother   . Cancer Maternal Grandfather        lung  . Uterine cancer Maternal Aunt     Social History Social History   Tobacco Use  . Smoking status: Former Smoker    Last attempt to quit: 11/04/2012    Years since quitting: 4.5  . Smokeless tobacco: Never Used  . Tobacco comment: very rare  Substance Use Topics  . Alcohol use: Yes    Frequency: Never    Comment: Glass of wine rarely.  . Drug use: No    No Known Allergies  Current Outpatient Medications  Medication Sig Dispense Refill  . buPROPion  (WELLBUTRIN XL) 150 MG 24 hr tablet TAKE ONE TABLET (150 MG TOTAL) BY MOUTH DAILY. ONE DAILY FOR ENERGY.  1  . citalopram (CELEXA) 40 MG tablet TAKE ONE TABLET BY MOUTH DAILY.    . fluocinonide ointment (LIDEX) 0.05 % APPLY TO AFFECTED AREA TWICE A DAY  0   No current facility-administered medications for this visit.     Review of Systems Review of Systems  Constitutional: Negative.   Gastrointestinal: Negative.   Genitourinary: Negative for genital sores, pelvic pain, vaginal bleeding, vaginal discharge and vaginal pain.  Neurological: Negative for dizziness.  Psychiatric/Behavioral: The patient is not nervous/anxious.     Blood pressure 140/80, pulse 72, resp. rate 16, height 5' 6.75" (1.695 m), weight 145 lb (65.8 kg), last menstrual period 06/05/2014.  Physical Exam Physical Exam  Constitutional: She appears well-developed and well-nourished.  Genitourinary: Pelvic exam was performed with patient supine. There is no rash, tenderness, lesion or injury on the right labia. There is no rash, tenderness, lesion or injury on the left labia.    Lymphadenopathy: No inguinal adenopathy noted on the right or left side.       Right: No inguinal adenopathy  present.       Left: No inguinal adenopathy present.  Skin: Skin is warm and dry.  Psychiatric: She has a normal mood and affect. Her behavior is normal. Judgment and thought content normal.    Data Reviewed Reviewed pap smear results with patient and consent form.  Assessment    Procedure Details  The risks and benefits of the procedure and Written informed consent obtained.  Speculum placed in vagina and excellent visualization of cervix achieved, cervix swabbed x 3 with Saline and  acetic acid solution. Cervix viewed with 3.75, 7.5 and 15# and green filter. Lugol's applied and stain was taken on all areas applied. Light staining in cervical os. ECC obtained. Monsel's applied with no active bleeding noted upon removal of speculum.  Patient tolerated procedure well. Instructions given verbal and printed.  Specimens: 1  Complications: none.     Plan    Specimens labelled and sent to Pathology. Patient will be called with report when reviewed.      Melvia Heaps 05/18/2017, 2:27 PM

## 2017-05-26 ENCOUNTER — Telehealth: Payer: Self-pay | Admitting: *Deleted

## 2017-05-26 NOTE — Telephone Encounter (Signed)
-----   Message from Regina Eck, CNM sent at 05/24/2017  8:17 AM EDT ----- Notify patient her ECC results showed benign squamous and glandular endocervical epithilium, no malignancy or dysplasia noted. Needs repeat pap smear in one year 08

## 2017-05-26 NOTE — Telephone Encounter (Signed)
Patient returning [name]'s call.  °

## 2017-05-26 NOTE — Telephone Encounter (Signed)
Spoke with patient, advised as seen below per Melvia Heaps, CNM. Patient verbalizes understanding and is agreeable. Will close encounter.

## 2017-05-26 NOTE — Telephone Encounter (Signed)
Notes recorded by Burnice Logan, RN on 05/26/2017 at 10:52 AM EDT Left message to call Sharee Pimple at [phone].  08 recall entered

## 2017-06-18 ENCOUNTER — Other Ambulatory Visit: Payer: Self-pay | Admitting: Oncology

## 2017-06-20 NOTE — Telephone Encounter (Signed)
Yvonne Hoover - I believe I tapered off all the steroids - she should not need a refill DrG

## 2017-06-27 ENCOUNTER — Telehealth: Payer: Self-pay

## 2017-06-27 NOTE — Telephone Encounter (Signed)
Left detailed message to call [place] at [phone]. Advised Melvia Heaps CNM has reviewed her mammogram report and everything is normal. Would like to see patient back in Nov for breast recheck. Needs appointment.

## 2017-06-29 NOTE — Telephone Encounter (Signed)
Left message to call Madyn Ivins at [phone]. 

## 2017-07-11 NOTE — Telephone Encounter (Signed)
Patient has not returned telephone call. Please advise.

## 2017-07-13 NOTE — Telephone Encounter (Signed)
-----   Message from Regina Eck, CNM sent at 07/12/2017 10:09 PM EDT ----- Regarding: please follow up with this due to past history of breast cancer See previous phone note

## 2017-07-13 NOTE — Telephone Encounter (Signed)
Left detailed message for patient to call & schedule breast recheck for 11/19

## 2017-07-13 NOTE — Telephone Encounter (Signed)
Letter sent.

## 2017-07-13 NOTE — Telephone Encounter (Signed)
Please send letter regarding follow up recommended for breast check, due history of breast cancer.

## 2017-07-21 NOTE — Telephone Encounter (Signed)
No callback from patient. Okay to close encounter? Routed to D Leonard,CNM for review.

## 2017-07-21 NOTE — Telephone Encounter (Signed)
OK to close encounter. 

## 2017-08-03 ENCOUNTER — Other Ambulatory Visit (INDEPENDENT_AMBULATORY_CARE_PROVIDER_SITE_OTHER): Payer: BLUE CROSS/BLUE SHIELD

## 2017-08-03 DIAGNOSIS — D693 Immune thrombocytopenic purpura: Secondary | ICD-10-CM

## 2017-08-03 LAB — CBC WITH DIFFERENTIAL/PLATELET
Basophils Absolute: 0.1 10*3/uL (ref 0.0–0.1)
Basophils Relative: 2 %
Eosinophils Absolute: 0.2 10*3/uL (ref 0.0–0.7)
Eosinophils Relative: 3 %
HCT: 43.9 % (ref 36.0–46.0)
Hemoglobin: 14.2 g/dL (ref 12.0–15.0)
Lymphocytes Relative: 31 %
Lymphs Abs: 2 10*3/uL (ref 0.7–4.0)
MCH: 29.7 pg (ref 26.0–34.0)
MCHC: 32.3 g/dL (ref 30.0–36.0)
MCV: 91.8 fL (ref 78.0–100.0)
Monocytes Absolute: 0.8 10*3/uL (ref 0.1–1.0)
Monocytes Relative: 12 %
Neutro Abs: 3.3 10*3/uL (ref 1.7–7.7)
Neutrophils Relative %: 52 %
Platelets: 77 10*3/uL — ABNORMAL LOW (ref 150–400)
RBC: 4.78 MIL/uL (ref 3.87–5.11)
RDW: 13.7 % (ref 11.5–15.5)
WBC: 6.4 10*3/uL (ref 4.0–10.5)

## 2017-08-04 ENCOUNTER — Telehealth: Payer: Self-pay | Admitting: *Deleted

## 2017-08-04 LAB — PATHOLOGIST SMEAR REVIEW: Path Review: REACTIVE

## 2017-08-04 NOTE — Telephone Encounter (Signed)
Called pt - no answer; left message to give me a call a back.

## 2017-08-04 NOTE — Telephone Encounter (Signed)
-----   Message from Annia Belt, MD sent at 08/03/2017  2:46 PM EDT ----- Call pt: platelet count 77,000: we can live with this.

## 2017-08-09 NOTE — Telephone Encounter (Signed)
Pt called / informed "platelet count 77,000: we can live with this." per Dr Beryle Beams. She asked when she needs to see Dr Leanne Lovely she will need f/u appt in Sept and I will send message to [facility] to schedule an appt.

## 2017-09-19 ENCOUNTER — Encounter: Payer: BLUE CROSS/BLUE SHIELD | Admitting: Oncology

## 2017-10-24 ENCOUNTER — Other Ambulatory Visit: Payer: Self-pay | Admitting: Oncology

## 2017-10-24 DIAGNOSIS — D693 Immune thrombocytopenic purpura: Secondary | ICD-10-CM

## 2017-10-25 ENCOUNTER — Ambulatory Visit: Payer: BLUE CROSS/BLUE SHIELD | Admitting: Oncology

## 2017-10-25 ENCOUNTER — Encounter: Payer: Self-pay | Admitting: Oncology

## 2017-10-25 ENCOUNTER — Other Ambulatory Visit: Payer: Self-pay

## 2017-10-25 VITALS — BP 145/66 | HR 66 | Temp 98.2°F | Ht 67.0 in | Wt 152.5 lb

## 2017-10-25 DIAGNOSIS — R2231 Localized swelling, mass and lump, right upper limb: Secondary | ICD-10-CM | POA: Diagnosis not present

## 2017-10-25 DIAGNOSIS — Z79899 Other long term (current) drug therapy: Secondary | ICD-10-CM

## 2017-10-25 DIAGNOSIS — Z9081 Acquired absence of spleen: Secondary | ICD-10-CM

## 2017-10-25 DIAGNOSIS — D693 Immune thrombocytopenic purpura: Secondary | ICD-10-CM | POA: Diagnosis not present

## 2017-10-25 DIAGNOSIS — F339 Major depressive disorder, recurrent, unspecified: Secondary | ICD-10-CM

## 2017-10-25 DIAGNOSIS — Z9221 Personal history of antineoplastic chemotherapy: Secondary | ICD-10-CM

## 2017-10-25 DIAGNOSIS — Z853 Personal history of malignant neoplasm of breast: Secondary | ICD-10-CM

## 2017-10-25 DIAGNOSIS — Z923 Personal history of irradiation: Secondary | ICD-10-CM

## 2017-10-25 NOTE — Patient Instructions (Addendum)
To lab today  Return as needed before 04/05/18  Referral will be made to Dr Truitt Merle at the [place] [facility] for your ongoing Hematology- Oncology care.  [phone]

## 2017-10-25 NOTE — Progress Notes (Signed)
Hematology and Oncology Follow Up Visit  Yvonne Hoover [id number] 1960-07-16 57 y.o. 10/25/2017 11:21 AM   Principle Diagnosis: Encounter Diagnoses  Name Primary?  . Immune thrombocytopenic purpura ([place]) Yes  . History of breast cancer in female   Clinical summary: 57 year old woman I have followed for many years at the cancer center.  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 She was put on N-plate given between May 2009 and November 2012 and had anexcellent response. She was changed to [facility] in September 2012. She has also achieved a sustained response with platelet count consistently over 100,000. She continued on long-term Promacta until February 2018.  There were data at that time that suggested a subset of patients might be able to maintain a remission off drug.  I stop the Promacta and she has been able to maintain a prolonged partial remission.  Platelet counts have been consistently at or above 50,000 with few exceptions.  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 Her breast cancerwas treated with lumpectomy. Tumor was close to the nipple areolar complex which was removed with the surgery. She received postop radiation. She was put on tamoxifen hormonal therapy which she took for 5 years through June 2015. She had an endometrial biopsy 10/29/2012 for dysfunctional uterine bleeding. No signs of malignancy. Tamoxifen resumed. Subsequently stopped as noted above. She elected not to transition to an aromatase inhibitor or extend tamoxifen therapy for an additional 5 years. She continues to get annual mammograms.  At time of her  March 2019 visit she had developed an extensive erythematous rash on the dorsum of her right hand and a macular rash in the bib area of her chest.  See picture in chart.  I treated her with a course of steroids and the rash slowly resolved.  Steroids subsequently tapered and discontinued.  Her platelet count had fallen to 25,000 but on steroids rose as high as 247,000 by April 2.  Platelet count subsequently drifted down with most recent count recorded August 03, 2017 of 77,000.  Interim History: No clinical bruising or bleeding. No interim medical problems.  The nurse practitioner who sees her in her gynecologist office thought she felt a new lump in the residual right breast, ipsilateral side of her original breast cancer.  Mammogram done on May 05, 2017 showed no new abnormalities. She noticed a small nodule subcutaneous tissues above the right elbow. Both of her children are doing well.  Her son who lives in [place] is on intermittent Rituxan for an autoimmune seizure disorder.  Medications: reviewed  Allergies: No Known Allergies  Review of Systems: See interim history. Remaining ROS negative:   Physical Exam: Blood pressure (!) 145/66, pulse 66, temperature 98.2 F (36.8 C), temperature source Oral, height '5\' 7"'  (1.702 m), weight 152 lb 8 oz (69.2 kg), last menstrual period 06/05/2014, SpO2 98 %. Wt Readings from Last 3 Encounters:  10/25/17 152 lb 8 oz (69.2 kg)  05/18/17 145 lb (65.8 kg)  05/05/17 146 lb (66.2 kg)     General appearance: Thin, well nourished , Caucasian woman HENNT: Pharynx no erythema, exudate, mass, or ulcer. No thyromegaly or thyroid nodules Lymph nodes: No cervical, supraclavicular, or axillary lymphadenopathy Breasts:  No abnormal skin changes, no dominant mass in either breast. Surgical changes left breast/ S/P excision nipple areola complex. Lungs: Clear to auscultation, resonant to percussion throughout Heart: Regular rhythm, no murmur, no gallop, no rub, no  click, no edema Abdomen: Soft, nontender, normal bowel sounds, no mass, no organomegaly Extremities: No edema, no calf tenderness Musculoskeletal: no joint deformities GU:  Vascular: Carotid pulses 2+, no bruits,  Neurologic: Alert, oriented, PERRLA, cranial nerves grossly normal, motor strength 5 over 5, reflexes 1+ symmetric, upper body coordination normal, gait normal, Skin: Small, less than 0.5 cm subcutaneous nodule movable firm about 6 cm above the right elbow joint.  Resolved cutaneous rash.  No ecchymosis.  Lab Results: CBC W/Diff    Component Value Date/Time   WBC 6.4 08/03/2017 0855   RBC 4.78 08/03/2017 0855   HGB 14.2 08/03/2017 0855   HGB 14.5 03/22/2017 1509   HGB 14.1 01/02/2015 0825   HCT 43.9 08/03/2017 0855   HCT 43.7 03/22/2017 1509   HCT 43.7 01/02/2015 0825   PLT 77 (L) 08/03/2017 0855   PLT 25 (LL) 03/22/2017 1509   MCV 91.8 08/03/2017 0855   MCV 92 03/22/2017 1509   MCV 91.3 01/02/2015 0825   MCH 29.7 08/03/2017 0855   MCHC 32.3 08/03/2017 0855   RDW 13.7 08/03/2017 0855   RDW 14.3 03/22/2017 1509   RDW 14.2 01/02/2015 0825   LYMPHSABS 2.0 08/03/2017 0855   LYMPHSABS 2.4 03/22/2017 1509   LYMPHSABS 2.1 01/02/2015 0825   MONOABS 0.8 08/03/2017 0855   MONOABS 0.9 01/02/2015 0825   EOSABS 0.2 08/03/2017 0855   EOSABS 0.2 03/22/2017 1509   BASOSABS 0.1 08/03/2017 0855   BASOSABS 0.1 03/22/2017 1509   BASOSABS 0.2 (H) 01/02/2015 0825     Chemistry      Component Value Date/Time   NA 144 01/18/2017 1015   NA 141 12/20/2013 1004   K 3.9 01/18/2017 1015   K 4.1 12/20/2013 1004   CL 105 01/18/2017 1015   CL 107 02/16/2012 1436   CO2 24 01/18/2017 1015   CO2 28 12/20/2013 1004   BUN 11 01/18/2017 1015   BUN 6.9 (L) 12/20/2013 1004   CREATININE 0.75 01/18/2017 1015   CREATININE 0.7 12/20/2013 1004      Component Value Date/Time   CALCIUM 9.2 01/18/2017 1015   CALCIUM 9.3 12/20/2013 1004   ALKPHOS 73 01/18/2017 1015   ALKPHOS 73 12/20/2013 1004    AST 18 01/18/2017 1015   AST 19 12/20/2013 1004   ALT 11 01/18/2017 1015   ALT 12 12/20/2013 1004   BILITOT 0.3 01/18/2017 1015   BILITOT 0.39 12/20/2013 1004    Lab pending   Radiological Studies: No results found.  Impression:  1.  Chronic ITP  2.  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 Continue annual flu vaccine and every 5-year Pneumovax vaccine.  CDC also recommending meningococcal vaccines in splenectomized patients.  Most recent Pneumovax 23 and meningococcus vaccines given February 2017.  3.  Unmaintained partial remission after about 1 year of thrombopoietin agonist therapy.  4.  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  Oncotype DX intermediate risk.  Treated as summarized above.  No evidence for recurrence.  She is now out over 10 years from diagnosis in March 2009.  5.  Idiopathic dermatitis: Resolved on steroids  6.  Likely dermatofibroma subcutaneous tissues right elbow area. I told her to just monitor this.  If it  begins to enlarge, then general surgery referral for excision.  I do not think this is an epitrochlear lymph node.  7.  Chronic depression controlled on 2 antidepressants.  I will transition her hematology oncology care to Dr. Truitt Merle at the [facility] cancer center after January 04, 2018.  CC: Patient Care Team: Lysle Pearl, MD as PCP - General (Family Medicine)   Murriel Hopper, MD, [place]  Hematology-Oncology/Internal Medicine     10/22/201911:21 AM

## 2017-10-26 ENCOUNTER — Telehealth: Payer: Self-pay | Admitting: *Deleted

## 2017-10-26 LAB — CBC WITH DIFFERENTIAL/PLATELET
Basophils Absolute: 0.2 10*3/uL (ref 0.0–0.2)
Basos: 2 %
EOS (ABSOLUTE): 0.2 10*3/uL (ref 0.0–0.4)
Eos: 3 %
Hematocrit: 42.8 % (ref 34.0–46.6)
Hemoglobin: 14.3 g/dL (ref 11.1–15.9)
Immature Grans (Abs): 0 10*3/uL (ref 0.0–0.1)
Immature Granulocytes: 0 %
Lymphocytes Absolute: 2.2 10*3/uL (ref 0.7–3.1)
Lymphs: 28 %
MCH: 30.4 pg (ref 26.6–33.0)
MCHC: 33.4 g/dL (ref 31.5–35.7)
MCV: 91 fL (ref 79–97)
Monocytes Absolute: 0.9 10*3/uL (ref 0.1–0.9)
Monocytes: 11 %
Neutrophils Absolute: 4.3 10*3/uL (ref 1.4–7.0)
Neutrophils: 56 %
Platelets: 51 10*3/uL — CL (ref 150–450)
RBC: 4.7 x10E6/uL (ref 3.77–5.28)
RDW: 13.3 % (ref 12.3–15.4)
WBC: 7.8 10*3/uL (ref 3.4–10.8)

## 2017-10-26 NOTE — Telephone Encounter (Signed)
Pt called / informed "platelets down to 51,000; Tell her to tell her dentist just do regular not deep cleaning; we need to check platelets every month." per Dr Beryle Beams. Voiced understanding. Lab appt scheduled 11/22 @ 1000 AM.

## 2017-10-26 NOTE — Telephone Encounter (Signed)
-----   Message from Annia Belt, MD sent at 10/26/2017  1:27 PM EDT ----- Call pt: platelets down to 51,000;  Tell her to tell her dentist just do regular not deep cleaning; we need to check platelets every month. Schedule for next month please.

## 2017-10-27 ENCOUNTER — Other Ambulatory Visit: Payer: Self-pay | Admitting: Oncology

## 2017-10-27 DIAGNOSIS — Z853 Personal history of malignant neoplasm of breast: Secondary | ICD-10-CM

## 2017-10-27 DIAGNOSIS — D693 Immune thrombocytopenic purpura: Secondary | ICD-10-CM

## 2017-11-08 ENCOUNTER — Ambulatory Visit: Payer: BLUE CROSS/BLUE SHIELD | Admitting: Certified Nurse Midwife

## 2017-11-09 ENCOUNTER — Other Ambulatory Visit: Payer: Self-pay

## 2017-11-09 ENCOUNTER — Encounter: Payer: Self-pay | Admitting: Certified Nurse Midwife

## 2017-11-09 ENCOUNTER — Ambulatory Visit (INDEPENDENT_AMBULATORY_CARE_PROVIDER_SITE_OTHER): Payer: BLUE CROSS/BLUE SHIELD | Admitting: Certified Nurse Midwife

## 2017-11-09 VITALS — BP 120/78 | HR 76 | Resp 16 | Wt 152.0 lb

## 2017-11-09 DIAGNOSIS — Z853 Personal history of malignant neoplasm of breast: Secondary | ICD-10-CM

## 2017-11-09 DIAGNOSIS — N6310 Unspecified lump in the right breast, unspecified quadrant: Secondary | ICD-10-CM | POA: Diagnosis not present

## 2017-11-09 DIAGNOSIS — Z8659 Personal history of other mental and behavioral disorders: Secondary | ICD-10-CM

## 2017-11-09 NOTE — Progress Notes (Signed)
Patient scheduled while in office for right breast Dx MMG and [place], if needed, at [facility] on 11/22/17 at 8:45am. Copy of order given to patient and faxed to [facility]. Patient verbalizes understanding and is agreeable.

## 2017-11-09 NOTE — Progress Notes (Signed)
   Subjective:   57 y.o. Divorced Caucasian female presents for evaluation of right breast and scar from previous breast cancer.. Onset of the symptoms  over the past few months. Patient sought evaluation because of change in thickness at outer edge of breast scar and some tenderness off and on.  Contributing factors include previous breast cancer in this breast.. Patient denies hiistory of trauma, bites, or injuries. Last mammogram was 6 months ago and was negative. Denies any nipple discharge, " just feels different ". Also having emotional time now with partner and changes in her life. Counselor has retired and she has not established with new one yet. 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 No other health issues today.  Review of Systems Review of Systems  Constitutional: Negative.  Negative for chills, fever and weight loss.  HENT: Negative.   Eyes: Negative.   Respiratory: Negative.   Cardiovascular: Negative.   Gastrointestinal: Negative.  Negative for melena.  Genitourinary: Negative.   Musculoskeletal: Negative.   Skin: Itching: dry skin on arms.       Dry skin on arms  Neurological: Negative.   Endo/Heme/Allergies: Bruises/bleeds easily.       ITTP history with platelets low now  Psychiatric/Behavioral: The patient is nervous/anxious.        Had PTSD moment recently, but joined friends and felt better      Objective:   General appearance: alert, cooperative, appears stated age, mild distress and tearing at times, appropriate dress  Breasts: normal appearance, no masses or tenderness, No nipple retraction or dimpling, No nipple discharge or bleeding, No axillary or supraclavicular adenopathy, right breast in scar area, thickness noted? enlargement and ? pea size mass noted, slightly tender, no redness or nipple discharge   Assessment:   ASSESSMENT: History of right breast cancer with thickening noted in scar area with some tenderness and  possible mass. ITTP history with low platelets at present sees Hematology Depression/anxiety history with PTSD, seeking new counselor    Plan:   Discussed right breast finding and need for evaluation with diagnostic mammogram and [place].Marland Kitchen Agreeable.Patient will be scheduled prior to leaving today. Stressed follow up with ITTP and with establishing with new counselor. If thoughts of self harm or others seek ER or 911.  Rv prn

## 2017-11-25 ENCOUNTER — Other Ambulatory Visit: Payer: BLUE CROSS/BLUE SHIELD

## 2017-11-29 ENCOUNTER — Telehealth: Payer: Self-pay | Admitting: Certified Nurse Midwife

## 2017-11-29 NOTE — Telephone Encounter (Signed)
Routed [employer] message to [employer] CNM. Will close telephone encounter.

## 2017-11-29 NOTE — Telephone Encounter (Signed)
Message   Hi [name]! I just realized I can message you via MyChart! In case you haven't gotten the results of my mammogram, I'm all good. They did a sonogram and a doctor checked me just to be thorough. She asked if I'd done radiation and told me that it could alter my breast tissue years down the road, so I could be feeling some of that. Otherwise it just seems to be scar tissue. So YAY!    Have a wonderful Thanksgiving! Thank-you for all your care!    Love,  Yvonne Hoover

## 2017-12-05 ENCOUNTER — Other Ambulatory Visit: Payer: Self-pay | Admitting: Oncology

## 2017-12-05 MED ORDER — AMOXICILLIN-POT CLAVULANATE 875-125 MG PO TABS: 1.0000 | ORAL_TABLET | Freq: Two times a day (BID) | ORAL | 0 refills | Status: AC

## 2017-12-21 ENCOUNTER — Telehealth: Payer: Self-pay | Admitting: Certified Nurse Midwife

## 2018-01-16 ENCOUNTER — Encounter: Payer: Self-pay | Admitting: Certified Nurse Midwife

## 2018-03-21 ENCOUNTER — Encounter: Payer: Self-pay | Admitting: *Deleted

## 2018-05-10 ENCOUNTER — Ambulatory Visit: Payer: BLUE CROSS/BLUE SHIELD | Admitting: Certified Nurse Midwife

## 2018-05-19 NOTE — Addendum Note (Signed)
Addended by: Hulan Fray on: 05/19/2018 03:53 PM   Modules accepted: Orders

## 2018-07-03 IMAGING — CT CT ABD-PELV W/ CM
2 of 5 series · 16 of 46 positions shown, 18 images · IV contrast (ISOVUE)
Comparison: 11/17/2009

CLINICAL DATA: Left lower quadrant pain onset 3 hours prior to
arrival. Nausea, vomiting and diarrhea.

EXAM:
CT ABDOMEN AND PELVIS WITH CONTRAST
TECHNIQUE: Multidetector CT imaging of the abdomen and pelvis was performed
using the standard protocol following bolus administration of
intravenous contrast.
CONTRAST:  100 cc Jsovue-IUU

[Series 2: abd/pel with · axial · 0.74mm/px · z∈[-892,-537]mm · 13 of 83 slices shown, 15 images]
[im 6/83  soft-tissue]
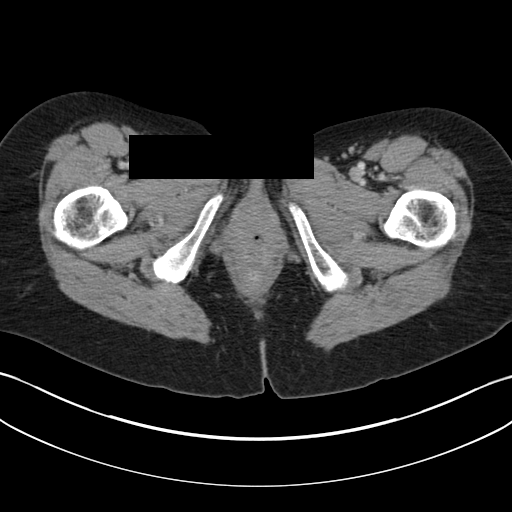
[im 6/83  bone]
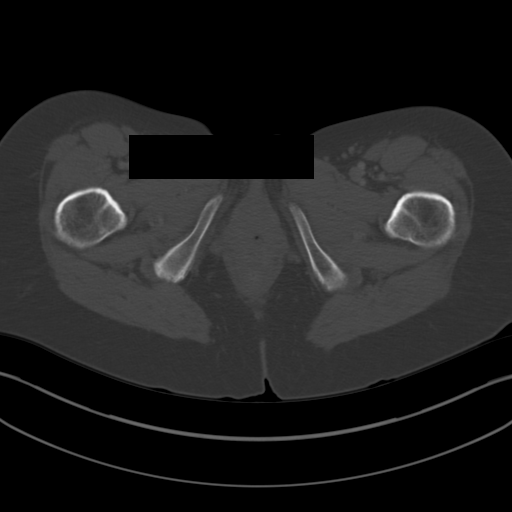
[im 12/83  soft-tissue]
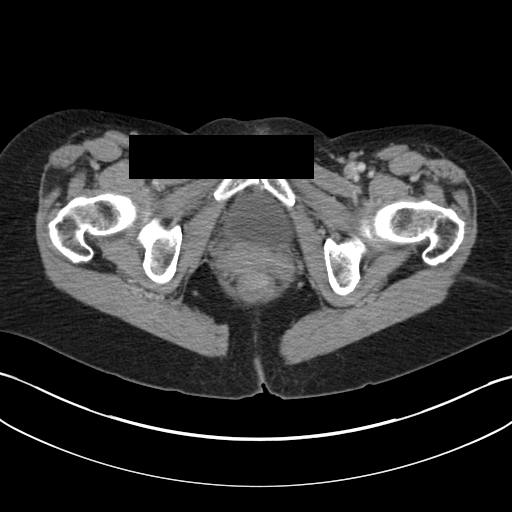
[im 18/83  soft-tissue]
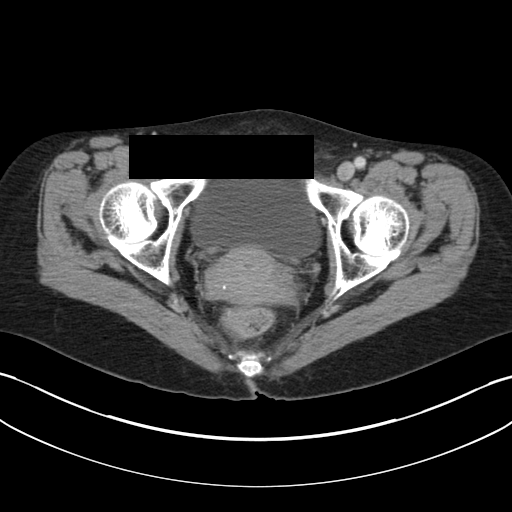
[im 24/83  soft-tissue]
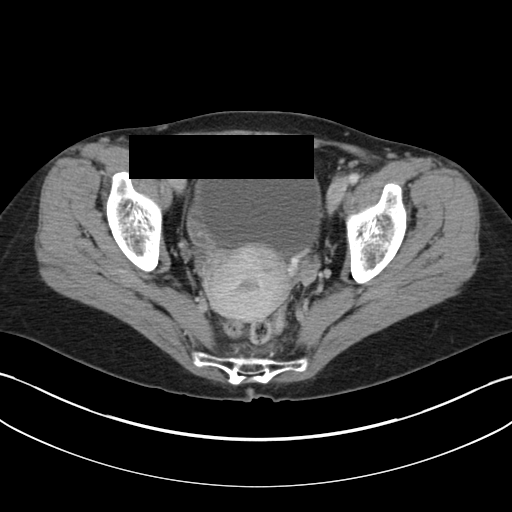
[im 30/83  soft-tissue]
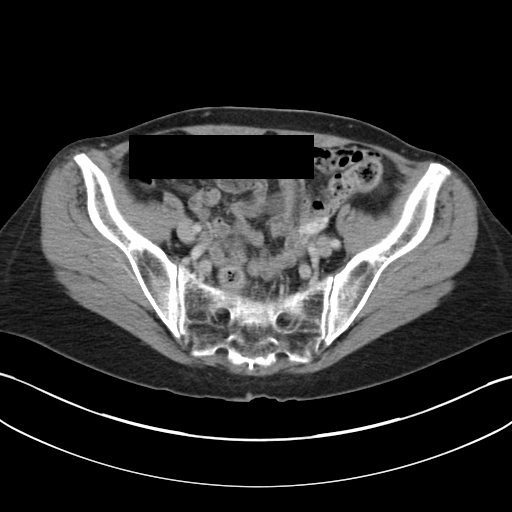
[im 36/83  soft-tissue]
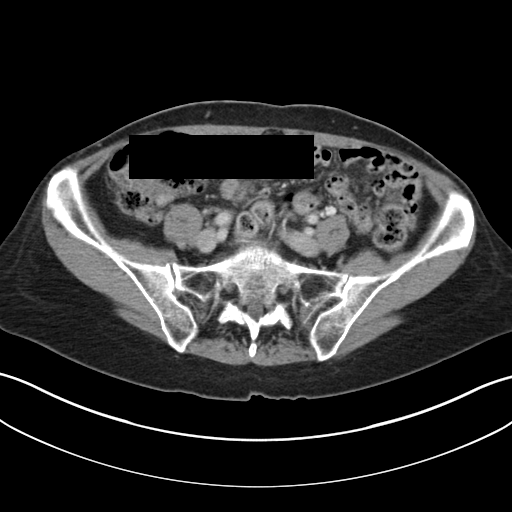
[im 42/83  soft-tissue]
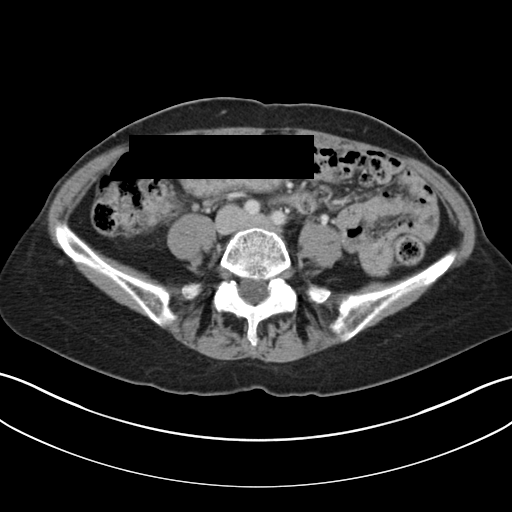
[im 47/83  soft-tissue]
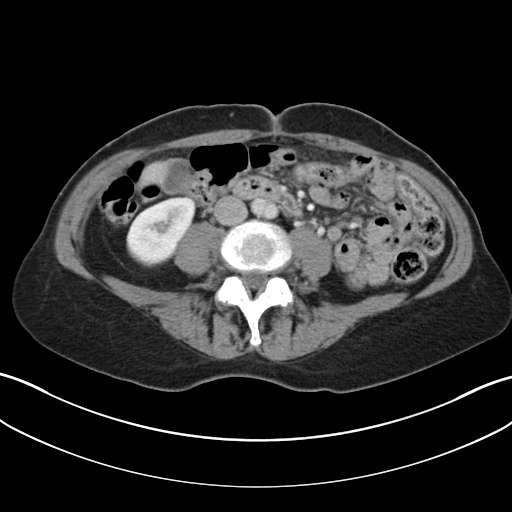
[im 53/83  soft-tissue]
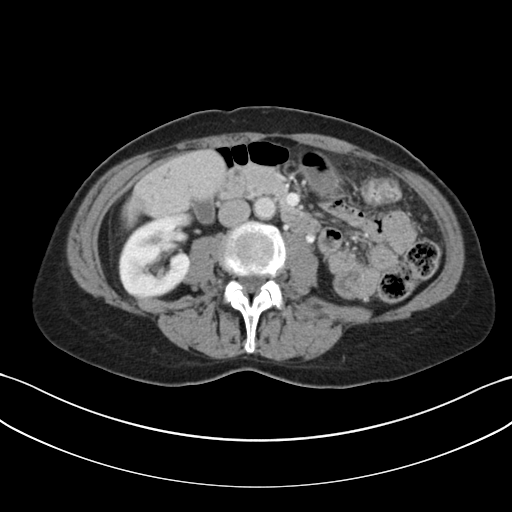
[im 53/83  bone]
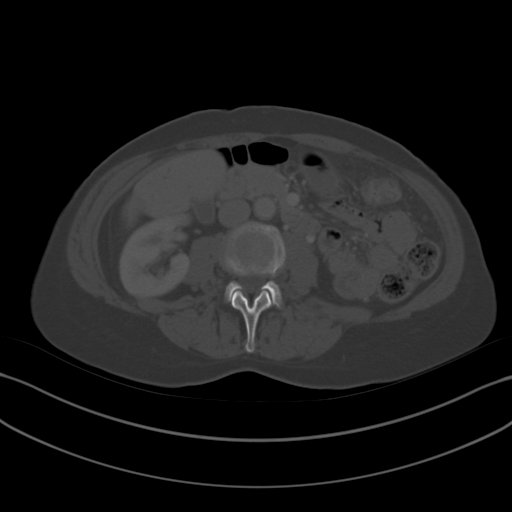
[im 59/83  soft-tissue]
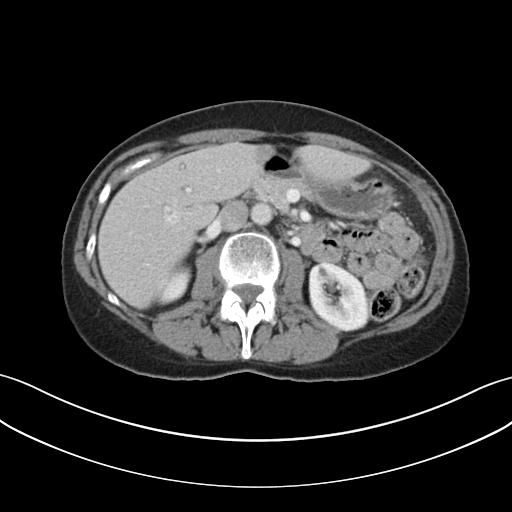
[im 65/83  soft-tissue]
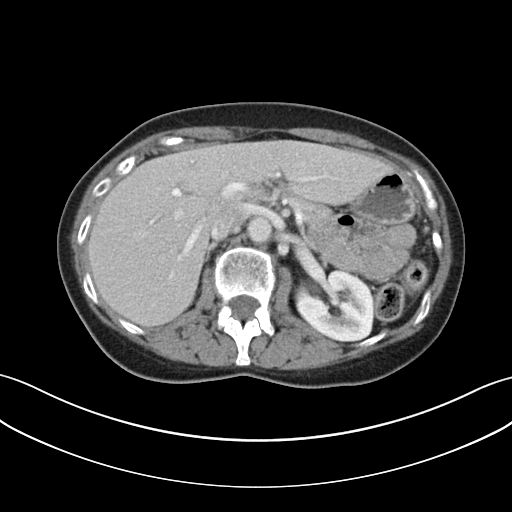
[im 71/83  soft-tissue]
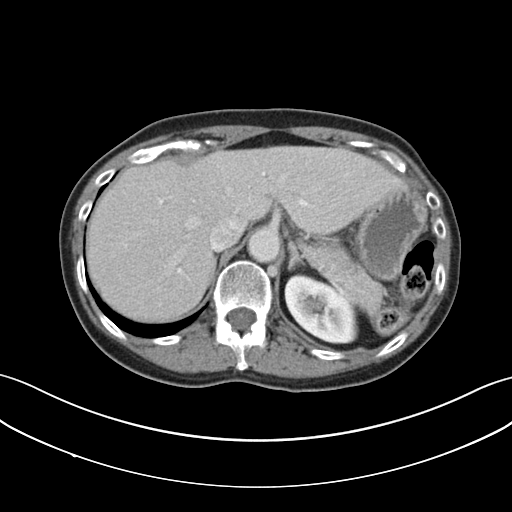
[im 77/83  soft-tissue]
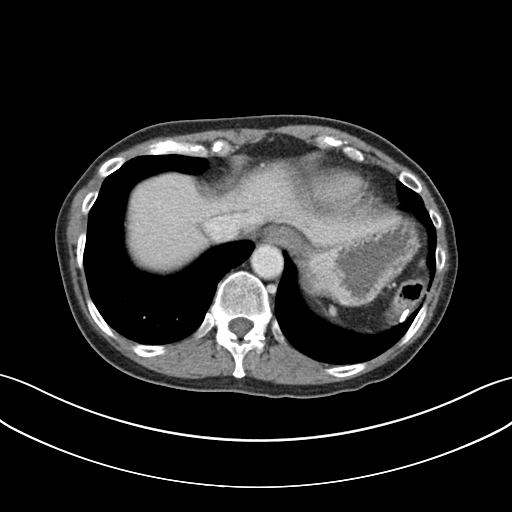

[Series 5: coronal a/|p · coronal · 0.78mm/px · 3 of 110 slices shown]
[im 37/110  soft-tissue]
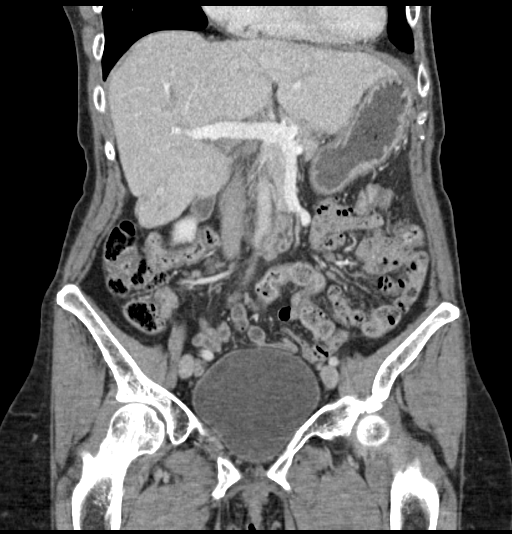
[im 49/110  soft-tissue]
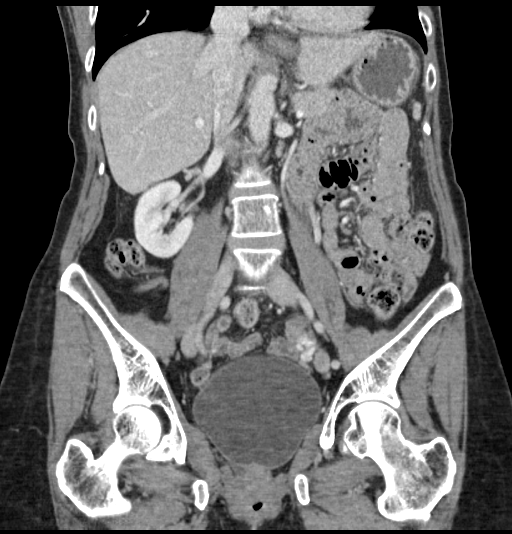
[im 61/110  soft-tissue]
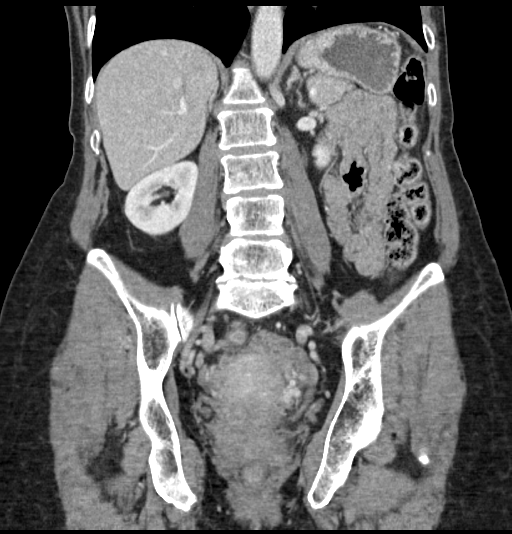

[16 of 46 positions shown; findings below may reference images not displayed]

FINDINGS: Lower chest: No acute abnormality.

Hepatobiliary: No focal liver abnormality is seen. No gallstones,
gallbladder wall thickening, or biliary dilatation.

Pancreas: Unremarkable. No pancreatic ductal dilatation or
surrounding inflammatory changes.

Spleen: Splenectomy.

Adrenals/Urinary Tract: Normal bilateral adrenal glands and kidneys
without obstructive uropathy. Unremarkable appearance of the
bladder.

Stomach/Bowel: Nondistended stomach. Normal small bowel rotation
without bowel obstruction. Mildly thickened long segment of
transverse colon consistent with colitis. Normal position of the
transverse colon may be altered due to splenectomy. Normal-appearing
appendix.

Vascular/Lymphatic: No significant vascular findings are present. No
enlarged abdominal or pelvic lymph nodes.

Reproductive: Uterus and bilateral adnexa are unremarkable. Mild
engorgement of periuterine vessels on the left may represent changes
of pelvic vascular congestion.

Other: No abdominal wall hernia or abnormality. No abdominopelvic
ascites.

Musculoskeletal: Degenerative disc disease L5-S1. No acute nor
suspicious osseous lesions.
IMPRESSION: 1. Segmental thickening of the transverse colon consistent with
colitis. No bowel obstruction.
2. Status post splenectomy.
3. Mild prominence of left periuterine vessels can be seen with
pelvic vascular congestion, a diagnosis of exclusion after other
etiologies in the workup of pelvic pain have been excluded.

## 2019-01-08 ENCOUNTER — Other Ambulatory Visit: Payer: Self-pay

## 2019-01-08 ENCOUNTER — Ambulatory Visit: Payer: BLUE CROSS/BLUE SHIELD | Admitting: Certified Nurse Midwife

## 2019-01-08 NOTE — Progress Notes (Deleted)
59 y.o. G45P2002 Divorced  Caucasian Fe here for annual exam.    Patient's last menstrual period was 06/05/2014.          Sexually active: {yes no:314532}  The current method of family planning is post menopausal status.    Exercising: {yes no:314532}  {types:19826} Smoker:  {YES NO:22349}  ROS  Health Maintenance: Pap:  03-09-15 neg, 05-05-17 neg HPV HR+ 18/45 + History of Abnormal Pap: yes MMG:  11/19 rt breast & u/s neg Self Breast exams: {YES NO:22349} Colonoscopy:  2017 f/u 17yr BMD:   none TDaP:  2015 Shingles: no Pneumonia: 2017 Hep C and HIV: HIV neg 2018 Labs: ***   reports that she quit smoking about 6 years ago. She has never used smokeless tobacco. She reports current alcohol use. She reports that she does not use drugs.  Past Medical History:  Diagnosis Date  . Abnormal Pap smear of cervix    in her 20's, just repeat done, 2019 neg HPV HR +, 18/45 +  . Abnormal uterine bleeding   . Alcohol abuse, in remission   . Bronchiolitis   . Cancer ([place] 07/2007   breast  . Cellulitis   . Depression   . Depression with anxiety 11/16/2010  . Fibroid 1992  . ITP (idiopathic thrombocytopenic purpura)    chronic  . STD (sexually transmitted disease)    positive HPV    Past Surgical History:  Procedure Laterality Date  . BONE MARROW BIOPSY  09/2000   back hip  . BREAST SURGERY  01/2000    left breast mass/ benign  . ear bone surgery    . ENDOMETRIAL BIOPSY      Current Outpatient Medications  Medication Sig Dispense Refill  . buPROPion (WELLBUTRIN XL) 150 MG 24 hr tablet TAKE ONE TABLET (150 MG TOTAL) BY MOUTH DAILY. ONE DAILY FOR ENERGY.  1  . citalopram (CELEXA) 40 MG tablet TAKE ONE TABLET BY MOUTH DAILY.    . fluocinonide ointment (LIDEX) 0.05 % APPLY TO AFFECTED AREA TWICE A DAY  0   No current facility-administered medications for this visit.    Family History  Problem Relation Age of Onset  . Hypertension Mother   . Thyroid disease Mother   .  Irregular heart beat Father   . Breast cancer Maternal Aunt   . Brain cancer Maternal Aunt   . Tuberculosis Maternal Grandmother   . Diabetes Paternal Grandmother   . Cancer Maternal Grandfather        lung  . Uterine cancer Maternal Aunt     ROS:  Pertinent items are noted in HPI.  Otherwise, a comprehensive ROS was negative.  Exam:   LMP 06/05/2014    Ht Readings from Last 3 Encounters:  10/25/17 '5\' 7"'  (1.702 m)  05/18/17 5' 6.75" (1.695 m)  05/05/17 5' 6.75" (1.695 m)    General appearance: alert, cooperative and appears stated age Head: Normocephalic, without obvious abnormality, atraumatic Neck: no adenopathy, supple, symmetrical, trachea midline and thyroid {EXAM; THYROID:18604} Lungs: clear to auscultation bilaterally Breasts: {Exam; breast:13139::"normal appearance, no masses or tenderness"} Heart: regular rate and rhythm Abdomen: soft, non-tender; no masses,  no organomegaly Extremities: extremities normal, atraumatic, no cyanosis or edema Skin: Skin color, texture, turgor normal. No rashes or lesions Lymph nodes: Cervical, supraclavicular, and axillary nodes normal. No abnormal inguinal nodes palpated Neurologic: Grossly normal   Pelvic: External genitalia:  no lesions              Urethra:  normal  appearing urethra with no masses, tenderness or lesions              Bartholin's and Skene's: normal                 Vagina: normal appearing vagina with normal color and discharge, no lesions              Cervix: {exam; cervix:14595}              Pap taken: {yes no:314532} Bimanual Exam:  Uterus:  {exam; uterus:12215}              Adnexa: {exam; adnexa:12223}               Rectovaginal: Confirms               Anus:  normal sphincter tone, no lesions  Chaperone present: ***  A:  Well Woman with normal exam  P:   Reviewed health and wellness pertinent to exam  Pap smear: {YES NO:22349}  {plan; gyn:5269::"mammogram","pap smear","return annually or prn"}  An  After Visit Summary was printed and given to the patient.

## 2019-01-30 ENCOUNTER — Other Ambulatory Visit: Payer: Self-pay

## 2019-01-31 NOTE — Progress Notes (Signed)
59 y.o. G81P2002 Divorced  Caucasian Fe here for annual exam. Menopasual no vaginal bleeding or vaginal dryness. Not sexually active at this time, separated from long term relationship. Emotionally doing better, living with parents now to assist as needed as they are aging.  STD screening needed. Sees PCP for labs aex, and antidepressant medication. Needs new Hematologist to management her ITTP.  She having no problems currently. Eating well also. Considering seeing Psychiatrist again for medication management.  Patient's last menstrual period was 06/05/2014.          Sexually active: No.  The current method of family planning is post menopausal status.    Exercising: No.  exercise Smoker:  no  Review of Systems  Constitutional: Negative.   HENT: Negative.   Eyes: Negative.   Respiratory: Negative.   Cardiovascular: Negative.   Gastrointestinal: Negative.   Genitourinary: Negative.   Musculoskeletal: Negative.   Skin: Negative.   Neurological: Negative.   Endo/Heme/Allergies: Negative.   Psychiatric/Behavioral: Negative.     Health Maintenance: Pap:  05-05-17 neg HPV HR+ 18/45+ History of Abnormal Pap: yes MMG:  05/2017 bilateral & rt breast u/s neg & rt breast 11/19 Self Breast exams: yes Colonoscopy: 2017 f/u 36yr BMD:   none TDaP:  2015 Shingles: no Pneumonia: 2017 Hep C and HIV: HIV neg 2018 Labs: yes   reports that she quit smoking about 6 years ago. She has never used smokeless tobacco. She reports previous alcohol use. She reports that she does not use drugs.  Past Medical History:  Diagnosis Date  . Abnormal Pap smear of cervix    in her 20's, just repeat done, 2019 neg HPV HR +, 18/45 +  . Abnormal uterine bleeding   . Alcohol abuse, in remission   . Bronchiolitis   . [place]   breast  . Cellulitis   . Depression   . Depression with anxiety 11/16/2010  . Fibroid 1992  . ITP (idiopathic thrombocytopenic purpura)    chronic  . STD (sexually  transmitted disease)    positive HPV    Past Surgical History:  Procedure Laterality Date  . BONE MARROW BIOPSY  09/2000   back hip  . BREAST SURGERY  01/2000    left breast mass/ benign  . ear bone surgery    . ENDOMETRIAL BIOPSY      Current Outpatient Medications  Medication Sig Dispense Refill  . buPROPion (WELLBUTRIN XL) 300 MG 24 hr tablet TAKE ONE TABLET (300 MG DOSE) BY MOUTH DAILY. ONE DAILY FOR ENERGY.    . citalopram (CELEXA) 40 MG tablet TAKE ONE TABLET BY MOUTH DAILY.    . fluocinonide ointment (LIDEX) 0.05 % APPLY TO AFFECTED AREA TWICE A DAY  0   No current facility-administered medications for this visit.    Family History  Problem Relation Age of Onset  . Hypertension Mother   . Thyroid disease Mother   . Irregular heart beat Father   . Breast cancer Maternal Aunt   . Brain cancer Maternal Aunt   . Tuberculosis Maternal Grandmother   . Diabetes Paternal Grandmother   . Cancer Maternal Grandfather        lung  . Uterine cancer Maternal Aunt     ROS:  Pertinent items are noted in HPI.  Otherwise, a comprehensive ROS was negative.  Exam:   BP 114/68   Pulse 68   Temp (!) 97.1 F (36.2 C) (Skin)   Resp 16   Ht 5' 6.75" (1.695 m)  Wt 150 lb (68 kg)   LMP 06/05/2014   BMI 23.67 kg/m  Height: 5' 6.75" (169.5 cm) Ht Readings from Last 3 Encounters:  02/02/19 5' 6.75" (1.695 m)  10/25/17 '5\' 7"'  (1.702 m)  05/18/17 5' 6.75" (1.695 m)    General appearance: alert, cooperative and appears stated age Head: Normocephalic, without obvious abnormality, atraumatic Neck: no adenopathy, supple, symmetrical, trachea midline and thyroid normal to inspection and palpation Lungs: clear to auscultation bilaterally Breasts: normal appearance, no masses or tenderness, No nipple retraction or dimpling, No nipple discharge or bleeding, No axillary or supraclavicular adenopathy, surgically scarring on right from breast cancer Heart: regular rate and rhythm Abdomen:  soft, non-tender; no masses,  no organomegaly Extremities: extremities normal, atraumatic, no cyanosis or edema Skin: Skin color, texture, turgor normal. No rashes or lesions Lymph nodes: Cervical, supraclavicular, and axillary nodes normal. No abnormal inguinal nodes palpated Neurologic: Grossly normal   Pelvic: External genitalia:  no lesions              Urethra:  normal appearing urethra with no masses, tenderness or lesions              Bartholin's and Skene's: normal                 Vagina: normal appearing vagina with normal color and discharge, no lesions              Cervix: multiparous appearance, no cervical motion tenderness and no lesions              Pap taken: Yes.   Bimanual Exam:  Uterus:  normal size, contour, position, consistency, mobility, non-tender and enlarged, 8-10 weeks size known fibroids weeks size              Adnexa: normal adnexa and no mass, fullness, tenderness               Rectovaginal: Confirms               Anus:  normal sphincter tone, no lesions  Chaperone present: yes  A:  Well Woman with normal exam  Post menopausal no HRT  History of right breast cancer  ITTP history needs new hematology  STD screening  Depression stable medication  BMD due  History of HPV with Positive 18,45 colpo  negative  P:   Reviewed health and wellness pertinent to exam  Aware if vaginal bleeding need to advise.  Continue follow up with mammogram as needed.  Suggested to patient Dr.Ennever for management. She will consider or discuss with PCP.  Lab: Affirm, GC/Chlamydia, HIV, RPR, Hep C  Patient will schedule with mammogram order faxed  Pap smear: yes due to previous abnormal   counseled on breast self exam, STD prevention, HIV risk factors and prevention, feminine hygiene, adequate intake of calcium and vitamin D, diet and exercise, Kegel's exercises  return annually or prn  An After Visit Summary was printed and given to the patient.

## 2019-02-02 ENCOUNTER — Ambulatory Visit (INDEPENDENT_AMBULATORY_CARE_PROVIDER_SITE_OTHER): Payer: 59 | Admitting: Certified Nurse Midwife

## 2019-02-02 ENCOUNTER — Encounter: Payer: Self-pay | Admitting: Certified Nurse Midwife

## 2019-02-02 ENCOUNTER — Other Ambulatory Visit: Payer: Self-pay

## 2019-02-02 ENCOUNTER — Other Ambulatory Visit (HOSPITAL_COMMUNITY)
Admission: RE | Admit: 2019-02-02 | Discharge: 2019-02-02 | Disposition: A | Discharge status: Home / Self Care | Payer: 59 | Source: Ambulatory Visit | Attending: Certified Nurse Midwife | Admitting: Certified Nurse Midwife

## 2019-02-02 VITALS — BP 114/68 | HR 68 | Temp 97.1°F | Resp 16 | Ht 66.75 in | Wt 150.0 lb

## 2019-02-02 DIAGNOSIS — Z124 Encounter for screening for malignant neoplasm of cervix: Secondary | ICD-10-CM | POA: Diagnosis present

## 2019-02-02 DIAGNOSIS — Z113 Encounter for screening for infections with a predominantly sexual mode of transmission: Secondary | ICD-10-CM | POA: Diagnosis not present

## 2019-02-02 DIAGNOSIS — Z01419 Encounter for gynecological examination (general) (routine) without abnormal findings: Secondary | ICD-10-CM

## 2019-02-02 DIAGNOSIS — Z8742 Personal history of other diseases of the female genital tract: Secondary | ICD-10-CM

## 2019-02-02 DIAGNOSIS — Z853 Personal history of malignant neoplasm of breast: Secondary | ICD-10-CM

## 2019-02-02 NOTE — Patient Instructions (Signed)

## 2019-02-03 LAB — VAGINITIS/VAGINOSIS, DNA PROBE
Candida Species: NEGATIVE
Gardnerella vaginalis: NEGATIVE
Trichomonas vaginosis: NEGATIVE

## 2019-02-03 LAB — HIV ANTIBODY (ROUTINE TESTING W REFLEX): HIV Screen 4th Generation wRfx: NONREACTIVE

## 2019-02-03 LAB — HEPATITIS C ANTIBODY: Hep C Virus Ab: <0.1 s/co ratio (ref 0.0–0.9)

## 2019-02-03 LAB — RPR: RPR Ser Ql: NONREACTIVE

## 2019-02-05 LAB — CYTOLOGY - PAP
Chlamydia: NEGATIVE
Comment: NEGATIVE
Comment: NORMAL
Diagnosis: NEGATIVE
Neisseria Gonorrhea: NEGATIVE

## 2019-02-22 ENCOUNTER — Ambulatory Visit: Payer: 59 | Admitting: Addiction (Substance Use Disorder)

## 2019-02-22 ENCOUNTER — Encounter: Payer: Self-pay | Admitting: Psychiatry

## 2019-02-22 ENCOUNTER — Ambulatory Visit (INDEPENDENT_AMBULATORY_CARE_PROVIDER_SITE_OTHER): Payer: 59 | Admitting: Psychiatry

## 2019-02-22 ENCOUNTER — Other Ambulatory Visit: Payer: Self-pay

## 2019-02-22 DIAGNOSIS — F418 Other specified anxiety disorders: Secondary | ICD-10-CM
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
## 2019-03-02 ENCOUNTER — Other Ambulatory Visit: Payer: Self-pay

## 2019-03-02 ENCOUNTER — Encounter: Payer: Self-pay | Admitting: Psychiatry

## 2019-03-02 ENCOUNTER — Ambulatory Visit (INDEPENDENT_AMBULATORY_CARE_PROVIDER_SITE_OTHER): Payer: 59 | Admitting: Psychiatry

## 2019-03-02 DIAGNOSIS — F418 Other specified anxiety disorders: Secondary | ICD-10-CM
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
## 2019-03-09 ENCOUNTER — Ambulatory Visit (INDEPENDENT_AMBULATORY_CARE_PROVIDER_SITE_OTHER): Payer: 59 | Admitting: Psychiatry

## 2019-03-09 ENCOUNTER — Encounter: Payer: Self-pay | Admitting: Psychiatry

## 2019-03-09 ENCOUNTER — Other Ambulatory Visit: Payer: Self-pay

## 2019-03-09 DIAGNOSIS — F418 Other specified anxiety disorders: Secondary | ICD-10-CM | POA: Diagnosis not present
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
## 2019-03-15 ENCOUNTER — Other Ambulatory Visit: Payer: Self-pay

## 2019-03-15 ENCOUNTER — Ambulatory Visit (INDEPENDENT_AMBULATORY_CARE_PROVIDER_SITE_OTHER): Payer: 59 | Admitting: Psychiatry

## 2019-03-15 ENCOUNTER — Encounter: Payer: Self-pay | Admitting: Psychiatry

## 2019-03-15 DIAGNOSIS — F418 Other specified anxiety disorders: Secondary | ICD-10-CM | POA: Diagnosis not present

## 2019-03-15 NOTE — Progress Notes (Signed)
      [facility] Counselor/Therapist Progress Note  Patient ID: Yvonne Hoover, MRN: [id number],    Date: 03/15/2019  Time Spent: 50 minutes   Treatment Type: Individual Therapy  Reported Symptoms: anxious, sad  Mental Status Exam:  Appearance:   Casual     Behavior:  Appropriate  Motor:  Normal  Speech/Language:   Clear and Coherent  Affect:  Appropriate  Mood:  anxious and sad  Thought process:  normal  Thought content:    WNL  Sensory/Perceptual disturbances:    WNL  Orientation:  oriented to person, place, time/date and situation  Attention:  Good  Concentration:  Good  Memory:  WNL  Fund of knowledge:   Good  Insight:    Good  Judgment:   Good  Impulse Control:  Good   Risk Assessment: Danger to Self:  No Self-injurious Behavior: No Danger to Others: No Duty to Warn:no Physical Aggression / Violence:No  Access to Firearms a concern: No  Gang Involvement:No   Subjective: The client states that she has had no meltdowns or anxiety attacks since last session.  She has been reading, "the sociopath next-door".  She has found it very intriguing and giving her better insight into some of the men that she has been in relationship with.  She was asking the question today if 1) do I have an addiction to pain?  Have I romanticized it?  2) I have a distrust of men.  3) I have this altered ego, "Mitzie hell" this personality is more predatory in nature.  She found that if she took from them (men) versus allowing them to take from her she could feel more powerful.  She readily admitted as she processed this using eye-movement that it never really worked out like that.  She did have sex with numerous men but it was never enjoyable and never satisfying.  She discussed at one point even making a chastity belt for herself to have a visual image to protect her.  We discussed at length how she took the ideas of feminism and women having power to a place that was actually hurtful for her.  It  could be okay for her to set boundaries and state what she wants.  She does not necessarily have to let anyone in unless she wants to.  Her positive cognition at the end of the session was, "I can take care of myself."  Her subjective units of distress went from a 6 to less than 1 at the end of the session.  The client will continue to journal these ideas for next session.  Interventions: Assertiveness/Communication, Mindfulness Meditation, Motivational Interviewing, Solution-Oriented/Positive Psychology, [employer] Group Desensitization and Reprocessing (EMDR) and Insight-Oriented  Diagnosis:   ICD-10-CM   1. Depression with anxiety  F41.8     Plan: Mindfulness, mood independent behavior, journaling, positive self talk, boundaries, assertiveness, self-care.  Meerab Maselli, [facility]

## 2019-03-23 ENCOUNTER — Encounter: Payer: Self-pay | Admitting: Psychiatry

## 2019-03-23 ENCOUNTER — Ambulatory Visit (INDEPENDENT_AMBULATORY_CARE_PROVIDER_SITE_OTHER): Payer: 59 | Admitting: Psychiatry

## 2019-03-23 DIAGNOSIS — F418 Other specified anxiety disorders: Secondary | ICD-10-CM

## 2019-03-23 NOTE — Progress Notes (Signed)
[facility] Counselor/Therapist Progress Note  Patient ID: Yvonne Hoover, MRN: [id number],    Date: 03/23/2019  Time Spent: 50 minutes   Treatment Type: Individual Therapy  Reported Symptoms: anxiety, sad  Mental Status Exam:  Appearance:   Casual     Behavior:  Appropriate  Motor:  Normal  Speech/Language:   Clear and Coherent  Affect:  Appropriate  Mood:  anxious and sad  Thought process:  normal  Thought content:    WNL  Sensory/Perceptual disturbances:    WNL  Orientation:  oriented to person, place, time/date and situation  Attention:  Good  Concentration:  Good  Memory:  WNL  Fund of knowledge:   Good  Insight:    Good  Judgment:   Good  Impulse Control:  Good   Risk Assessment: Danger to Self:  No Self-injurious Behavior: No Danger to Others: No Duty to Warn:no Physical Aggression / Violence:No  Access to Firearms a concern: No  Gang Involvement:No   Subjective: I connected with this patient by an approved telecommunication method (audio only), with her informed consent, and verifying identity and patient privacy.  I was located at my office and patient at her home.  As needed, we discussed the limitations, risks, and security and privacy concerns associated with telehealth service, including the availability and conditions which currently govern in-person appointments and the possibility that 3rd-party payment Juliyah Mergen not be fully guaranteed and she Keilen Kahl be responsible for charges.  After she indicated understanding, we proceeded with the session.  Also discussed treatment planning, as needed, including ongoing verbal agreement with the plan, the opportunity to ask and answer all questions, her demonstrated understanding of instructions, and her readiness to call the office should symptoms worsen or she feels she is in a crisis state and needs more immediate and tangible assistance. The client had some symptoms of COVID-19 so she did not come in until she gets  tested. The client went over her journal with me.  She has begun to map out her trip out [place] and which friends she will see on the way.  Her original plan was to travel across the country up to [place] and then over to [place], [place].  This is where the female friend lives that she has been talking to on the phone.  He apparently is in the process of moving out of the house that his girlfriend owns since they have broken up.  She stated she called him in the conversation "went weird".  Her plan was to help him with his move but he acted squirrelly in his communication with her suddenly.  The next day the client stated, "I woke up and I was clear in my head.  I do not need him as a romantic relationship."  She stated then within a couple of days she became obsessed with him.  She picked a text fight with him and knew she was doing it.  Then she started sending goofy videos and photos to try to encourage him.  As this continued the client then gained more awareness that she does not need to be so present in his life. The client was then upset with herself that she had started doing things that she knew were destructive.  She has a Facebook friend that asked her out even though they have never met.  She had told him no.  She then ignored all of that and started manipulatively sexting with him.  She ended that but  was upset with herself.  She does want to explore with the EMDR this manipulative part of herself. The client agreed that she needs to take her trip out [place].  Her plan was to get her COVID-19 shot today but because of her COVID symptoms she is getting a test instead.  She is also worried about her parents because she feels that her father is getting more frail day by day.  What if something bad happens while I am gone?  We discussed some options that she could take that would still allow her to take her trip [place].  She could follow-up at a [employer] along the way to get her COVID-19 shot and  then 3 weeks later stopped at another [facility] to get her second shot.  She felt like this could work.  She could also help her parents employ [employer] to come in and help her dad and mom while she is away.  I also suggested that she send an email to her siblings most of whom are local that she will be out of town and they would need to help their parents.  The client felt like these were boundaries she could set up.  The client also wants to follow-up with a medical practitioner here since she gets her psychiatric medicine from her primary care physician.  She currently is taking Wellbutrin 150 mg.  She was at 300 mg but felt too agitated and her hair was falling out significantly.  She is also on 40 mg of Celexa but is not sure if she sees any benefit from it.   Interventions: Assertiveness/Communication, Motivational Interviewing, Solution-Oriented/Positive Psychology and Insight-Oriented  Diagnosis:   ICD-10-CM   1. Depression with anxiety  F41.8     Plan: Emailed his siblings, contract with [employer] for parents, plan her trip out [place], get her COVID-19 shots along the way at drugstores, positive self talk, self-care, boundaries, assertiveness.  Chantry Headen, [facility]

## 2019-03-27 ENCOUNTER — Encounter: Payer: Self-pay | Admitting: Certified Nurse Midwife

## 2019-03-29 ENCOUNTER — Ambulatory Visit: Payer: 59

## 2019-03-30 ENCOUNTER — Encounter: Payer: Self-pay | Admitting: Psychiatry

## 2019-03-30 ENCOUNTER — Other Ambulatory Visit: Payer: Self-pay

## 2019-03-30 ENCOUNTER — Ambulatory Visit (INDEPENDENT_AMBULATORY_CARE_PROVIDER_SITE_OTHER): Payer: 59 | Admitting: Psychiatry

## 2019-03-30 DIAGNOSIS — F418 Other specified anxiety disorders: Secondary | ICD-10-CM

## 2019-03-30 NOTE — Progress Notes (Signed)
[facility] Counselor/Therapist Progress Note  Patient ID: LISMARY HANDS, MRN: [id number],    Date: 03/30/2019  Time Spent: 50 minutes   Treatment Type: Individual Therapy  Reported Symptoms: anxious, sad  Mental Status Exam:  Appearance:   Casual     Behavior:  Appropriate  Motor:  Normal  Speech/Language:   Clear and Coherent  Affect:  Appropriate  Mood:  anxious and sad  Thought process:  normal  Thought content:    WNL  Sensory/Perceptual disturbances:    WNL  Orientation:  oriented to person, place, time/date and situation  Attention:  Good  Concentration:  Good  Memory:  WNL  Fund of knowledge:   Good  Insight:    Good  Judgment:   Good  Impulse Control:  Good   Risk Assessment: Danger to Self:  No Self-injurious Behavior: No Danger to Others: No Duty to Warn:no Physical Aggression / Violence:No  Access to Firearms a concern: No  Gang Involvement:No   Subjective: The client had concerns about the vaccine but spoke with her hematologist who allayed her fears.  The client came in [facility] her vaccine.  She still had that anxiety response as she came into the city.  She wanted to go by the old house that she lived in but considered it self sabotage.  "I do not know why I do it ".  I used eye-movement with the client focusing on this issue.  Her subjective units of distress was a 6.  As the client discussed that she did not talk about her old boyfriend but the nostalgia of the place that she lived in the dog that she raised.  That was the place that made her feel good about herself.  I suggested to the client that maybe she goes by to give herself more closure.  The client thought this could be true.  As we continue to process the current guy that she had been talking to in [place] state came to mind.  She realizes that this is not the relationship she needs.  "I feel icky about it."  We discussed just setting the boundary that she is not in a place in her  life to do relationship.  I asked the client what her priority was?  "My priority is making myself feel bad."  This causes the client a lot of angst.  She then went on to relate how she made a great workspace for herself in her parents basement.  "Now I have made it junky."  I asked the client what she needed to do to reclaim that?  As we discussed this it became clear that she needed to affirm herself more.  Included in this would be more mood independent behavior, intentionality and writing a self affirming manifesto.  I asked the client to read this aloud to herself each day Waynesha Rammel be in front of the mirror.  Be intentional about the direction she wants to go asking herself the questions is this what I want?  Is this what I enjoy?  The client agreed.  Her subjective units of distress at the end of the session was less than 3.  Interventions: Assertiveness/Communication, Mindfulness Meditation, Motivational Interviewing, Solution-Oriented/Positive Psychology, [employer] Group Desensitization and Reprocessing (EMDR) and Insight-Oriented  Diagnosis:   ICD-10-CM   1. Depression with anxiety  F41.8     Plan: Daily mindful meditation, self-care, manifesto, assertiveness, boundaries, exercise.  Deklan Minar, [facility]

## 2019-04-02 ENCOUNTER — Telehealth: Payer: Self-pay | Admitting: Certified Nurse Midwife

## 2019-04-02 NOTE — Telephone Encounter (Signed)
Patient is ready to schedule her BMD and request that an order be sent to [facility].

## 2019-04-02 NOTE — Telephone Encounter (Signed)
AEX 02/02/19 with Melvia Heaps, CNM BMD: Never Postmenopasusal, no HRT BMD discussed at AEX  [facility] BMD order to Dr. Talbert Nan for review and signature.

## 2019-04-02 NOTE — Telephone Encounter (Signed)
BMD order faxed to [facility].   Patient notified. Patient to call and schedule.   Encounter closed.

## 2019-04-13 ENCOUNTER — Other Ambulatory Visit: Payer: Self-pay

## 2019-04-13 ENCOUNTER — Encounter: Payer: Self-pay | Admitting: Psychiatry

## 2019-04-13 ENCOUNTER — Ambulatory Visit (INDEPENDENT_AMBULATORY_CARE_PROVIDER_SITE_OTHER): Payer: 59 | Admitting: Psychiatry

## 2019-04-13 DIAGNOSIS — F418 Other specified anxiety disorders: Secondary | ICD-10-CM | POA: Diagnosis not present
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
## 2019-05-07 ENCOUNTER — Telehealth: Payer: Self-pay

## 2019-05-07 ENCOUNTER — Encounter: Payer: Self-pay | Admitting: Obstetrics & Gynecology

## 2019-05-07 ENCOUNTER — Other Ambulatory Visit (HOSPITAL_COMMUNITY)
Admission: RE | Admit: 2019-05-07 | Discharge: 2019-05-07 | Disposition: A | Discharge status: Home / Self Care | Payer: 59 | Source: Ambulatory Visit | Attending: Obstetrics & Gynecology | Admitting: Obstetrics & Gynecology

## 2019-05-07 ENCOUNTER — Ambulatory Visit (INDEPENDENT_AMBULATORY_CARE_PROVIDER_SITE_OTHER): Payer: 59 | Admitting: Obstetrics & Gynecology

## 2019-05-07 ENCOUNTER — Other Ambulatory Visit: Payer: Self-pay

## 2019-05-07 VITALS — BP 120/80 | HR 68 | Temp 97.9°F | Resp 16 | Wt 151.0 lb

## 2019-05-07 DIAGNOSIS — N950 Postmenopausal bleeding: Secondary | ICD-10-CM

## 2019-05-07 NOTE — Telephone Encounter (Signed)
AEX 01/2019 with DL Neg STD panel 01/2019 Pt states has hx of yeast/BV infections    Spoke with pt. Pt reports having vaginal spotting that is pale pink, vaginal discharge with slight odor, cramps since Friday night and back pain that started 1 day ago. Pt denies fever, chills, UTI sx, heavy bleeding or clots. Pt not on HRT. Not SA at this time.    Advised pt to be seen for further evaluation. Pt agreeable. Pt states has seen Dr Sabra Heck in past. Pt scheduled as work-in OV with Dr Sabra Heck at [facility]. Pt verbalized understanding and is agreeable to date and time. CPS Neg. Pt states had both Covid vaccines.  Routing to Dr Sabra Heck for review.  Encounter closed.

## 2019-05-07 NOTE — Progress Notes (Signed)
GYNECOLOGY  VISIT  CC:   Vaginal bleeding  HPI: 59 y.o. G38P2002 Divorced White or Caucasian female here for PMP bleeding that started on Friday night.  Reports having some pelvic cramping in the past month or so.  Bleeding is pink and light.  She did have the same symptoms of when she would have cycles.  No recent acne or breast tenderness.    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 Georgiann Cocker.  Last CBC was 02/2019 and platlet ct was ~60K.    Last pap was 02/02/2019.  This was negative and HR HPV was negative as well.    Concerned about having cancer again.  "Feels like it may be present".    GYNECOLOGIC HISTORY: Patient's last menstrual period was 06/05/2014. Contraception: post menopausal Menopausal hormone therapy: none  Patient Active Problem List   Diagnosis Date Noted  . Dermatitis 03/22/2017  . E. coli colitis 06/06/2016  . Blood pressure elevated without history of HTN 06/05/2016  . Nausea vomiting and diarrhea   . Postmenopausal bleeding 05/12/2014  . Fibroids, intramural 05/12/2014  . Fibroids, subserous 05/12/2014  . Fibroids, submucosal 02/21/2013  . History of breast cancer in female 11/16/2010  . Depression with anxiety 11/16/2010  . 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 Abnormal Pap smear of cervix    in her 20's, just repeat done, 2019 neg HPV HR +, 18/45 +  . Abnormal uterine bleeding   . Alcohol abuse, in remission   . Bronchiolitis   . Cancer ([place]) 07/2007   breast  . Cellulitis   . Depression   . Depression with anxiety 11/16/2010  . Fibroid 1992  . ITP (idiopathic thrombocytopenic purpura)    chronic  . STD (sexually transmitted disease)    positive HPV    Past Surgical History:  Procedure Laterality Date  . BONE MARROW BIOPSY  09/2000   back hip  . BREAST SURGERY  01/2000    left breast mass/ benign  . ear bone surgery    . ENDOMETRIAL BIOPSY      MEDS:   Current Outpatient  Medications on File Prior to Visit  Medication Sig Dispense Refill  . amphetamine-dextroamphetamine (ADDERALL XR) 10 MG 24 hr capsule Take by mouth.    Marland Kitchen buPROPion (WELLBUTRIN XL) 150 MG 24 hr tablet Take by mouth.    . citalopram (CELEXA) 40 MG tablet TAKE ONE TABLET BY MOUTH DAILY.    . fluocinonide ointment (LIDEX) 0.05 % APPLY TO AFFECTED AREA TWICE A DAY  0  . ciprofloxacin (CIPRO) 500 MG tablet Take 500 mg by mouth 2 (two) times daily.     No current facility-administered medications on file prior to visit.    ALLERGIES: Amoxicillin-pot clavulanate  Family History  Problem Relation Age of Onset  . Hypertension Mother   . Thyroid disease Mother   . Irregular heart beat Father   . Breast cancer Maternal Aunt   . Brain cancer Maternal Aunt   . Tuberculosis Maternal Grandmother   . Diabetes Paternal Grandmother   . Cancer Maternal Grandfather        lung  . Uterine cancer Maternal Aunt     SH:  Divorced, former smoker  Review of Systems  Constitutional: Negative.   HENT: Negative.   Eyes: Negative.   Respiratory: Negative.   Cardiovascular: Negative.   Gastrointestinal: Negative.   Endocrine: Negative.   Genitourinary:       Vaginal bleeding  Musculoskeletal: Negative.   Skin: Negative.   Allergic/Immunologic: Negative.   Neurological: Negative.   Psychiatric/Behavioral: Negative.     PHYSICAL EXAMINATION:    BP 120/80   Pulse 68   Temp 97.9 F (36.6 C) (Skin)   Resp 16   Wt 151 lb (68.5 kg)   LMP 06/05/2014   BMI 23.83 kg/m     General appearance: alert, cooperative and appears stated age Lymph:  no inguinal LAD noted  Pelvic: External genitalia:  no lesions              Urethra:  normal appearing urethra with no masses, tenderness or lesions              Bartholins and Skenes: normal                 Vagina: normal appearing vagina with normal color and discharge, no lesions              Cervix: no lesions              Bimanual Exam:  Uterus:   normal size, contour, position, consistency, mobility, non-tender              Adnexa: no mass, fullness, tenderness  Endometrial biopsy recommended.  Discussed with patient.  Verbal and written consent obtained.   Procedure:  Speculum placed.  Cervix visualized and cleansed with betadine prep.  A single toothed tenaculum was applied to the anterior lip of the cervix.  Endometrial pipelle was advanced through the cervix into the endometrial cavity without difficulty.  Pipelle passed to 7cm.  Suction applied and pipelle removed with scant tissue sample obtained.  Second pass performed.  Tenculum removed.  No bleeding noted.  Patient tolerated procedure well.  Chaperone, Royal Hawthorn, CMA, was present for exam.  Assessment: PMP bleeding H/o breast cancer with concerns about new cancer  Plan: Endometrial biopsy pathology pending.  Will consider PUS as well for pt reassurance if biopsy is negative which I expect it will be.

## 2019-05-07 NOTE — Telephone Encounter (Signed)
Patient is calling in regards to being post menopausal and experiencing some issues. Patient is having period like cramps and spotting. Patient states after spotting she had some colorless discharge.

## 2019-05-07 NOTE — Telephone Encounter (Signed)
This appt is fine.  Thanks.

## 2019-05-09 LAB — SURGICAL PATHOLOGY

## 2019-05-25 ENCOUNTER — Other Ambulatory Visit: Payer: Self-pay

## 2019-05-25 ENCOUNTER — Telehealth: Payer: Self-pay | Admitting: Obstetrics & Gynecology

## 2019-05-25 ENCOUNTER — Encounter: Payer: Self-pay | Admitting: Psychiatry

## 2019-05-25 ENCOUNTER — Ambulatory Visit (INDEPENDENT_AMBULATORY_CARE_PROVIDER_SITE_OTHER): Payer: 59 | Admitting: Psychiatry

## 2019-05-25 DIAGNOSIS — F418 Other specified anxiety disorders: Secondary | ICD-10-CM

## 2019-05-25 DIAGNOSIS — N950 Postmenopausal bleeding: Secondary | ICD-10-CM

## 2019-05-25 NOTE — Telephone Encounter (Signed)
Patient recently had a EMB and thought Dr.Miller told her she may need to have a SHGM? Marland Kitchen
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
## 2019-05-28 ENCOUNTER — Telehealth: Payer: Self-pay | Admitting: Obstetrics & Gynecology

## 2019-05-28 NOTE — Telephone Encounter (Signed)
Left message to call Sharee Pimple, RN at [facility].    Reviewed 05/07/19 EMB pathology results.  Megan Salon, MD  05/09/2019 1:00 PM EDT    Please let pt know her biopsy did not show any abnormal cells. We discussed proceeding with an ultrasound even if this was negative. Order has been placed for this and she will be called to schedule.   Order placed for PUS.

## 2019-05-28 NOTE — Telephone Encounter (Signed)
Spoke with patient. PUS scheduled for 06/07/19 at 1pm, consult to follow at 1:30pm with Dr. Sabra Heck. Patient verbalizes understanding and is agreeable.   Routing to provider for final review. Patient is agreeable to disposition. Will close encounter.  Cc: Hayley Carder

## 2019-05-28 NOTE — Telephone Encounter (Signed)
Call to patient. Per DPR, OK to leave message on voicemail.  Left voicemail requesting a return call to Hayley to review benefits for scheduled Pelvic ultrasound with M. Suzanne Miller, MD 

## 2019-05-31 NOTE — Telephone Encounter (Signed)
Call to patient. Per DPR, OK to leave message on voicemail.  Left voicemail requesting a return call to Hayley to review benefits for scheduled Pelvic ultrasound with M. Suzanne Miller, MD 

## 2019-06-01 ENCOUNTER — Ambulatory Visit: Payer: 59 | Admitting: Psychiatry

## 2019-06-05 NOTE — Telephone Encounter (Signed)
Spoke with patient regarding benefits for recommended ultrasound. Patient is aware that ultrasound is transvaginal. Patient acknowledges understanding of information presented. Patient is aware of cancellation policy. Encounter closed. °

## 2019-06-07 ENCOUNTER — Other Ambulatory Visit: Payer: Self-pay

## 2019-06-07 ENCOUNTER — Encounter: Payer: Self-pay | Admitting: Obstetrics & Gynecology

## 2019-06-07 ENCOUNTER — Ambulatory Visit: Payer: 59 | Admitting: Obstetrics & Gynecology

## 2019-06-07 ENCOUNTER — Ambulatory Visit (INDEPENDENT_AMBULATORY_CARE_PROVIDER_SITE_OTHER): Payer: 59

## 2019-06-07 VITALS — BP 110/70 | HR 68 | Temp 97.3°F | Resp 16 | Wt 151.0 lb

## 2019-06-07 DIAGNOSIS — N950 Postmenopausal bleeding: Secondary | ICD-10-CM

## 2019-06-07 DIAGNOSIS — D251 Intramural leiomyoma of uterus: Secondary | ICD-10-CM

## 2019-06-07 NOTE — Progress Notes (Signed)
59 y.o. G52P2002 Divorced White or Caucasian female here for pelvic ultrasound due to PMP bleeding.  Pt had endometrial biopsy on 05/07/2019 showing scant strips of benign endometrial glandular tissue.  Pt has not had any further bleeding.  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  Has hx of breast cancer and expressed concern about having another cancer when came for initial evaluation of PMP bleeding.    Patient's last menstrual period was 06/05/2014.  Contraception: PMP  Findings:  UTERUS: 7.0 x 4.8 x 4.1cm with 1.2cm, 0.7cm, 1.4cm and 1.0cm fiborids EMS: 2.23mm ADNEXA: Left ovary: 1.6 x 1.2 x 0.8cm       Right ovary:  1.9 x 0.9 x 0.7cm CUL DE SAC: no free fluid  Discussion:  Findings reviewed.  ultrasound compared to prior images from 05/2014.  Fibroids are smaller today in comparison.  With 2.41mm endometrium and normal ovaries, do not find any concern for gyn malignancy and no additional need for additional investigation.  Pt is comfortable with this plan.  Knows to call with any future bleeding.  Assessment:  PMP bleeding H/o breast cancer Uterine fibroids  Plan:  Pt will monitor for any future bleeding.  Knows to call if this occurs.  She is aware no treatment for fibroids needed at this time.

## 2019-06-08 ENCOUNTER — Ambulatory Visit (INDEPENDENT_AMBULATORY_CARE_PROVIDER_SITE_OTHER): Payer: 59 | Admitting: Psychiatry

## 2019-06-08 ENCOUNTER — Encounter: Payer: Self-pay | Admitting: Psychiatry

## 2019-06-08 DIAGNOSIS — F418 Other specified anxiety disorders: Secondary | ICD-10-CM | POA: Diagnosis not present

## 2019-06-08 NOTE — Progress Notes (Signed)
°      [facility] Counselor/Therapist Progress Note  Patient ID: Yvonne Hoover, MRN: [id number],    Date: 06/08/2019  Time Spent: 50 minutes   Treatment Type: Individual Therapy  Reported Symptoms: anxiety, sad  Mental Status Exam:  Appearance:   Casual     Behavior:  Appropriate  Motor:  Normal  Speech/Language:   Clear and Coherent  Affect:  Appropriate  Mood:  anxious and sad  Thought process:  normal  Thought content:    WNL  Sensory/Perceptual disturbances:    WNL  Orientation:  oriented to person, place, time/date and situation  Attention:  Good  Concentration:  Good  Memory:  WNL  Fund of knowledge:   Good  Insight:    Good  Judgment:   Good  Impulse Control:  Good   Risk Assessment: Danger to Self:  No Self-injurious Behavior: No Danger to Others: No Duty to Warn:no Physical Aggression / Violence:No  Access to Firearms a concern: No  Gang Involvement:No   Subjective: The client states that she spent time with her family over the weekend.  The dynamics among her siblings was very negative.  She stated it just made her feel bad.  She also had an interaction with her primary care physician who essentially told her that she does not value herself because of her Buddhist beliefs.  This made the client cry which was very difficult.  I focused on these issues with eye-movement with the client.  Her negative cognition is, "I do not value myself."  She feels anxiety in her chest.  As we began to process she remembered a uterine ultrasound that she had.  The doctor turned the screen around so she could see what she was doing.  As the client looked at her uterus she realized how much she did not care for her body over the years.  She became very tearful.  "My poor body, I have not treated it well."  As the client continued to process she began to have insight into all the progress that she has made.  She stated she had chosen the female therapist because she has difficulty trusting  men.  Today she is realizing that she can trust this therapist more fully.  I affirmed to the client that as we worked if she felt she came to a point where she needed to work with a woman she had every freedom to do that.  The client was grateful but stated for the time being she wants to continue working here.  She did agree that she has made more progress than she has given herself credit for.  She sees more clearly the codependent behavior that she gets into trouble with men.  She agrees that she needs not to care take and possibly focus on developing a good network of women.  Interventions: Assertiveness/Communication, Mindfulness Meditation, Motivational Interviewing, Solution-Oriented/Positive Psychology, [facility] Group Desensitization and Reprocessing (EMDR) and Insight-Oriented  Diagnosis:   ICD-10-CM   1. Depression with anxiety  F41.8     Plan: Mindfulness, meditation, positive self talk, self-care, assertiveness, boundaries, radical acceptance.  Teren Franckowiak, [facility]

## 2019-06-15 ENCOUNTER — Other Ambulatory Visit: Payer: Self-pay

## 2019-06-15 ENCOUNTER — Ambulatory Visit (INDEPENDENT_AMBULATORY_CARE_PROVIDER_SITE_OTHER): Payer: 59 | Admitting: Psychiatry

## 2019-06-15 ENCOUNTER — Encounter: Payer: Self-pay | Admitting: Psychiatry

## 2019-06-15 DIAGNOSIS — F418 Other specified anxiety disorders: Secondary | ICD-10-CM | POA: Diagnosis not present

## 2019-06-15 NOTE — Progress Notes (Signed)
°      [facility] Counselor/Therapist Progress Note  Patient ID: Yvonne Hoover, MRN: [id number],    Date: 06/15/2019  Time Spent: 50 minutes   Treatment Type: Individual Therapy  Reported Symptoms: anxiety, sad  Mental Status Exam:  Appearance:   Casual     Behavior:  Appropriate  Motor:  Normal  Speech/Language:   Clear and Coherent  Affect:  Appropriate  Mood:  anxious and sad  Thought process:  normal  Thought content:    WNL  Sensory/Perceptual disturbances:    WNL  Orientation:  oriented to person, place, time/date and situation  Attention:  Good  Concentration:  Good  Memory:  WNL  Fund of knowledge:   Good  Insight:    Good  Judgment:   Good  Impulse Control:  Good   Risk Assessment: Danger to Self:  No Self-injurious Behavior: No Danger to Others: No Duty to Warn:no Physical Aggression / Violence:No  Access to Firearms a concern: No  Gang Involvement:No   Subjective: "I recognize I am enjoying my freedom of singleness.  I still feel I need the attention of a man.  This makes me mad."  Today we used eye-movement focusing on this issue for the client.  Her subjective units of distress is an 8.  She feels it in her throat, arms and chest.  As the client processed she stated, "I cannot take care of all my needs.  I get overwhelmed."  As the client discussed this she described life feeling more rough and relationship is some padding between herself and that.  I pointed out to the client that she has this core belief that she is not capable.  She realized that men have told her over time that she is not capable and she always needed their affirmation and support.  The client also began to think about her ex-boyfriend and grieve the loss of that relationship.  She saw that there were moments in the relationship where she was in the kind of relationship she wanted.  I pointed out that this was a type of magical thinking where 10% of the relationship was great but the other 90%  was terrible.  She kept holding out for the 10% that never materialized.  The client agreed.  As she continued to process we talked about her being skillful and mindful..  She identified that there are things she just did not want to do but it was not that she could not do them.  She then identified that, "I cannot allow someone access to me if they cannot love me in a reciprocal way."  I affirmed that with the client that she was worthwhile.  Her positive cognition at the end of the session was, "I am capable."  Her subjective units of distress was less than 2.  Interventions: Assertiveness/Communication, Mindfulness Meditation, Motivational Interviewing, Solution-Oriented/Positive Psychology, [employer] Group Desensitization and Reprocessing (EMDR) and Insight-Oriented  Diagnosis:   ICD-10-CM   1. Depression with anxiety  F41.8     Plan: Boundaries, assertiveness, positive self talk, radical acceptance, mood independent behavior, self-care.  Whitley Strycharz, [facility]

## 2019-06-18 ENCOUNTER — Ambulatory Visit (INDEPENDENT_AMBULATORY_CARE_PROVIDER_SITE_OTHER): Payer: 59 | Admitting: Adult Health

## 2019-06-18 ENCOUNTER — Encounter: Payer: Self-pay | Admitting: Adult Health

## 2019-06-18 ENCOUNTER — Other Ambulatory Visit: Payer: Self-pay

## 2019-06-18 VITALS — BP 173/91 | HR 93 | Ht 67.0 in | Wt 141.0 lb

## 2019-06-18 DIAGNOSIS — F411 Generalized anxiety disorder: Secondary | ICD-10-CM

## 2019-06-18 DIAGNOSIS — F331 Major depressive disorder, recurrent, moderate: Secondary | ICD-10-CM

## 2019-06-18 NOTE — Progress Notes (Signed)
[facility] MD/PA/NP Initial Note  06/18/2019 2:06 PM Yvonne Hoover  MRN:  [id number]  Chief Complaint:   HPI:   Describes mood today as - Mood symptoms - reports depression and anxiety. Decreased irritability. Celexa 7m decreased to 224ma few weeks ago. Will "obsessively" think about things. Feels like symptoms initially started when she was 11. Symptoms worsened towards the end of her marriage. Stating "that's when I started having passive suicidal thoughts". Has mad a promise to not harm herself because of her children. Has 2 children ages 3380 [place]CP [place]. Has continued to work with him for 35 years. Has learned to meditate and helped with her mood. Is also seeing a therapist - "I have made some breakthroughs with him". Stable interest and motivation. Taking medications as prescribed.  Energy levels vary. Adderall gives her a "false" energy. Active, has a regular exercise routine 5 times a week. Walking. Going up and down the stairs.  Enjoys some usual interests and activities. Divorced. Single. Broke up with a guy last year she had been with for 7 years. Moved in with elderly parents and is helping them. Has 5 cats. Spending time with family. Appetite adequate. Weight stable - 141 pounds. Sleeps well most nights. Wakes up with headaches. Has been told she snores. Averages 7 hours. Focus and concentration difficulties - "has an overactive mind". Not sure if the Adderall is helpful. Feels "hyper" on it. Feels distracted on the medication. Taking care of elderly parents. 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 Is project oriented. Managing aspects of household - keeping things neat.  Denies SI or HI. Denies AH or VH.  Previous medication trials: Celexa 2012maily. Wellbutrin XL 150m70mdderall XR 20mg27mly  Visit Diagnosis:    ICD-10-CM   1. Generalized anxiety  disorder  F41.1   2. Major depressive disorder, recurrent episode, moderate (HCC)  F33.1     Past Psychiatric History: Denies psychiatric hospitalization.  Past Medical History:  Past Medical History:  Diagnosis Date  . Abnormal Pap smear of cervix    in her 20's, just repeat done, 2019 neg HPV HR +, 18/45 +  . Abnormal uterine bleeding   . Alcohol abuse, in remission   . Bronchiolitis   . Cancer (HCC) [place]2009   breast  . Cellulitis   . Depression   . Depression with anxiety 11/16/2010  . Fibroid 1992  . ITP (idiopathic thrombocytopenic purpura)    chronic  . STD (sexually transmitted disease)    positive HPV    Past Surgical History:  Procedure Laterality Date  . BONE MARROW BIOPSY  09/2000   back hip  . BREAST SURGERY  01/2000    left breast mass/ benign  . ear bone surgery    . ENDOMETRIAL BIOPSY      Family Psychiatric History: Depression runs in family - sister.  Family History:  Family History  Problem Relation Age of Onset  . Hypertension Mother   . Thyroid disease Mother   . Irregular heart beat Father   . Breast cancer Maternal Aunt   . Brain cancer Maternal Aunt   . Tuberculosis Maternal Grandmother   . Diabetes Paternal Grandmother   . Cancer Maternal Grandfather        lung  . Uterine cancer Maternal Aunt     Social History:  Social History  Socioeconomic History  . Marital status: Divorced    Spouse name: Not on file  . Number of children: Not on file  . Years of education: Not on file  . Highest education level: Not on file  Occupational History  . Not on file  Tobacco Use  . Smoking status: Former Smoker    Quit date: 11/04/2012    Years since quitting: 6.6  . Smokeless tobacco: Never Used  . Tobacco comment: very rare  Substance and Sexual Activity  . Alcohol use: Not Currently  . Drug use: No  . Sexual activity: Not Currently    Partners: Male    Birth control/protection: Post-menopausal  Other Topics Concern  . Not on file   Social History Narrative  . Not on file   Social Determinants of Health   Financial Resource Strain:   . Difficulty of Paying Living Expenses:   Food Insecurity:   . Worried About Charity fundraiser in the Last Year:   . Arboriculturist in the Last Year:   Transportation Needs:   . Film/video editor (Medical):   Marland Kitchen Lack of Transportation (Non-Medical):   Physical Activity:   . Days of Exercise per Week:   . Minutes of Exercise per Session:   Stress:   . Feeling of Stress :   Social Connections:   . Frequency of Communication with Friends and Family:   . Frequency of Social Gatherings with Friends and Family:   . Attends Religious Services:   . Active Member of Clubs or Organizations:   . Attends Archivist Meetings:   Marland Kitchen Marital Status:     Allergies:  Allergies  Allergen Reactions  . Amoxicillin-Pot Clavulanate Hives    Metabolic Disorder Labs: No results found for: HGBA1C, MPG No results found for: PROLACTIN No results found for: CHOL, TRIG, HDL, CHOLHDL, VLDL, LDLCALC Lab Results  Component Value Date   TSH 1.369 01/31/2013    Therapeutic Level Labs: No results found for: LITHIUM No results found for: VALPROATE No components found for:  CBMZ  Current Medications: Current Outpatient Medications  Medication Sig Dispense Refill  . amphetamine-dextroamphetamine (ADDERALL XR) 20 MG 24 hr capsule Take by mouth.    Marland Kitchen buPROPion (WELLBUTRIN XL) 150 MG 24 hr tablet Take by mouth.    . ciprofloxacin (CIPRO) 500 MG tablet Take 500 mg by mouth 2 (two) times daily.    . citalopram (CELEXA) 20 MG tablet Take by mouth.    . fluocinonide ointment (LIDEX) 0.05 % APPLY TO AFFECTED AREA TWICE A DAY  0   No current facility-administered medications for this visit.    Medication Side Effects: none  Orders placed this visit:  No orders of the defined types were placed in this encounter.   Psychiatric Specialty Exam:  Review of Systems  Last menstrual  period 06/05/2014.There is no height or weight on file to calculate BMI.  General Appearance: Neat and Well Groomed  Eye Contact:  Good  Speech:  Clear and Coherent and Normal Rate  Volume:  Normal  Mood:  Anxious and Depressed  Affect:  Appropriate and Congruent  Thought Process:  Coherent and Descriptions of Associations: Intact  Orientation:  Full (Time, Place, and Person)  Thought Content: Logical   Suicidal Thoughts:  No  Homicidal Thoughts:  No  Memory:  WNL  Judgement:  Good  Insight:  Good  Psychomotor Activity:  Normal  Concentration:  Concentration: Good  Recall:  Good  Fund of  Knowledge: Good  Language: Good  Assets:  Communication Skills Desire for Improvement Financial Resources/Insurance Housing Intimacy Leisure Time Physical Health Resilience Social Support Talents/Skills Transportation Vocational/Educational  ADL's:  Intact  Cognition: WNL  Prognosis:  Good   Screenings:  PHQ2-9     Office Visit from 10/25/2017 in [place] Office Visit from 03/22/2017 in [place] Office Visit from 02/16/2016 in [place] Office Visit from 08/18/2015 in [place] Office Visit from 02/25/2015 in [place]  PHQ-2 Total Score 0 0 0 0 1      Receiving Psychotherapy: Yes   Treatment Plan/Recommendations:   Plan:  PDMP reviewed  1. Adderall XR 70m every morning - may switch to IR 2. Wellbutrin XL 1562mevery mornig - has taken as much as 30094maily - had a seizure in childhood.  3. Celexa 46m53mily  Plans to send records from previous provider.   Discussed Adderall IR in place of XR  Discussed Cymbalta   Discussed sleep apnea.   Greater than 50% of face to face time with patient was spent on counseling and coordination of care. We discussed Adderall, seizures, sleep apnea.     RTC 4 weeks  Patient advised to contact office with  any questions, adverse effects, or acute worsening in signs and symptoms.  RegiAloha Gell

## 2019-06-19 ENCOUNTER — Telehealth: Payer: Self-pay

## 2019-06-19 NOTE — Telephone Encounter (Signed)
Patient notified of bone density result & appt scheduled.

## 2019-06-22 ENCOUNTER — Ambulatory Visit: Payer: 59 | Admitting: Psychiatry

## 2019-06-29 ENCOUNTER — Other Ambulatory Visit: Payer: Self-pay

## 2019-06-29 ENCOUNTER — Encounter: Payer: Self-pay | Admitting: Psychiatry

## 2019-06-29 ENCOUNTER — Ambulatory Visit (INDEPENDENT_AMBULATORY_CARE_PROVIDER_SITE_OTHER): Payer: 59 | Admitting: Psychiatry

## 2019-06-29 DIAGNOSIS — F418 Other specified anxiety disorders: Secondary | ICD-10-CM

## 2019-06-29 NOTE — Progress Notes (Signed)
      [facility] Counselor/Therapist Progress Note  Patient ID: Yvonne Hoover, MRN: [id number],    Date: 06/29/2019  Time Spent: 50 minutes   Treatment Type: Individual Therapy  Reported Symptoms: anxiety, sadness  Mental Status Exam:  Appearance:   Well Groomed     Behavior:  Appropriate  Motor:  Normal  Speech/Language:   Clear and Coherent  Affect:  Appropriate  Mood:  anxious and sad  Thought process:  normal  Thought content:    WNL  Sensory/Perceptual disturbances:    WNL  Orientation:  oriented to person, place, time/date and situation  Attention:  Good  Concentration:  Good  Memory:  WNL  Fund of knowledge:   Good  Insight:    Good  Judgment:   Good  Impulse Control:  Good   Risk Assessment: Danger to Self:  No Self-injurious Behavior: No Danger to Others: No Duty to Warn:no Physical Aggression / Violence:No  Access to Firearms a concern: No  Gang Involvement:No   Subjective: The client states that she has unblocked her ex-boyfriend.  She is found that she is okay with it.  "I feel magnanimous.  I am good with it."  The client feels she has gotten the closure that she has needed.  The client then talked about her current female interest who is a friend that lives in [place].  The client states that it has been harder to let go of him.  She discussed feeling reluctant because of a fear of abandonment.  I used eye-movement with client around the idea of abandonment.  As she processed she stated that this guy has left in the past for periods of time.  As she continued to process she said, "I am clear.  I am happy being single."  As the client processed more she stated "I will not be responsible for him".  As we continued processing the client felt a lot of sadness come up for her.  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  She dresses up as an  angel statue sprinkling glitter on people as they come by.  She realized that a lot of people now come back each year for her "blessing".  This surprises the client quite a bit.  She realized that that was 1 way that she gave loving care to people.  "I need to give love and attention to myself."  This is very clear to the client.  As this settled into the client she realized that she like to be in charge of her life.  She wants to choose that trajectory.  Her positive cognition was, "I am in charge of my trajectory."  Her subjective units of distress had started at a 7 and ended at a 1.  Interventions: Assertiveness/Communication, Mindfulness Meditation, Motivational Interviewing, Solution-Oriented/Positive Psychology, [employer] Group Desensitization and Reprocessing (EMDR) and Insight-Oriented  Diagnosis:   ICD-10-CM   1. Depression with anxiety  F41.8     Plan: Mindful practices, positive self talk, assertiveness, boundaries, self-care.  Melesio Madara, [facility]

## 2019-07-10 ENCOUNTER — Encounter: Payer: Self-pay | Admitting: Psychiatry

## 2019-07-10 ENCOUNTER — Ambulatory Visit (INDEPENDENT_AMBULATORY_CARE_PROVIDER_SITE_OTHER): Payer: 59 | Admitting: Psychiatry

## 2019-07-10 ENCOUNTER — Other Ambulatory Visit: Payer: Self-pay

## 2019-07-10 DIAGNOSIS — F418 Other specified anxiety disorders: Secondary | ICD-10-CM | POA: Diagnosis not present

## 2019-07-10 NOTE — Progress Notes (Signed)
°      [facility] Counselor/Therapist Progress Note  Patient ID: BEE MARCHIANO, MRN: [id number],    Date: 07/10/2019  Time Spent: 50 minutes   Treatment Type: Individual Therapy  Reported Symptoms: anxiety, sad  Mental Status Exam:  Appearance:   Casual     Behavior:  Appropriate  Motor:  Normal  Speech/Language:   Clear and Coherent  Affect:  Appropriate  Mood:  anxious and sad  Thought process:  normal  Thought content:    WNL  Sensory/Perceptual disturbances:    WNL  Orientation:  oriented to person, place, time/date and situation  Attention:  Good  Concentration:  Good  Memory:  WNL  Fund of knowledge:   Good  Insight:    Good  Judgment:   Good  Impulse Control:  Good   Risk Assessment: Danger to Self:  No Self-injurious Behavior: No Danger to Others: No Duty to Warn:no Physical Aggression / Violence:No  Access to Firearms a concern: No  Gang Involvement:No   Subjective: Client said that she was feeling a lot of grief on her way over today to the appointment.  I discussed with the client what she thought was going on?  She felt like she was not taking enough time for herself.  She has started some major renovation projects at her parents house.  She feels it is necessary since her dad is getting so fragile.  Her siblings are not really available or willing to help her so she is on her own.  She also states that she has had a medication change from Wellbutrin to Cymbalta.  She is continuing on the Adderall.  She feels that the medication change has been positive.  She was concerned with her history of seizures that taking the Wellbutrin was too much of a risk.  So far she feels that it is working well. The client feels that she is okay with her ex-boyfriend.  Also the man in [place] that she was talking to and saw as a potential partner "went dark."  She then realized that they were only going to be friends.  "I have let go of a budding romance."  The client states that  because of this she had an epiphany.  "I am afraid of being alone."  I asked the client if this meant she felt like she needed a partner?  She is unsure about that.  She thinks that she might need a dog.  She agreed she needs to be looking into that.  The client also needs to focus on what is happening in the present tense and allowing herself to have some fun.  The client states that that is something that has been missing from her life.  I also encouraged her to work on expanding her community and social network.  Whether that is through [facility] groups or her girlfriends going kayaking together.  The client agreed.  Interventions: Assertiveness/Communication, Mindfulness Meditation, Motivational Interviewing, Solution-Oriented/Positive Psychology and Insight-Oriented  Diagnosis:   ICD-10-CM   1. Depression with anxiety  F41.8     Plan: Boundaries, self-care, meet up groups, kayaking, social network, positive self talk, assertiveness, boundaries.  Jabarri Stefanelli, [facility]

## 2019-07-16 ENCOUNTER — Ambulatory Visit: Payer: 59 | Admitting: Adult Health

## 2019-07-23 ENCOUNTER — Other Ambulatory Visit: Payer: Self-pay

## 2019-07-23 ENCOUNTER — Encounter: Payer: Self-pay | Admitting: Psychiatry

## 2019-07-23 ENCOUNTER — Ambulatory Visit (INDEPENDENT_AMBULATORY_CARE_PROVIDER_SITE_OTHER): Payer: 59 | Admitting: Psychiatry

## 2019-07-23 DIAGNOSIS — F418 Other specified anxiety disorders: Secondary | ICD-10-CM | POA: Diagnosis not present
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
## 2019-07-30 ENCOUNTER — Ambulatory Visit: Payer: 59 | Admitting: Psychiatry

## 2019-07-30 NOTE — Progress Notes (Signed)
Subjective:    51 yrs Divorced Caucasian G2P2002  female here to discuss recent BMD obtained 06/08/2019 showing osteoporosis in her left femur with t score -2.8 and -2.6.  Spine measurements and right femur showed only osteopenia.  Results discussed.  Fracture risks discussed.  Copy of results shared with pt.  Osteoporosis Risk Factors  Personal Hx of fracture as an adult: no Family hx of osteoporosis in her mother Caucasian race: yes Advanced age: no Female sex: yes Dementia: no Poor health/frailty: no  Potentially modifiable: Tobacco use: no Low body weight (<127 lbs): no Estrogen deficiency:  no Low calcium intake (lifelong): yes Alcohol use more than 2 drinks per day: no Recurrent falls: no Inadequate physical activity: yes  Current calcium and Vit D intake: about two servings of calcium in diet.  No supplements.  Review of Systems A comprehensive review of systems was negative.     Objective:   PHYSICAL EXAM BP 128/70 (BP Location: Left Arm, Patient Position: Sitting, Cuff Size: Normal)   Pulse 68   Resp 12   Ht 5' 6.75" (1.695 m)   Wt 148 lb (67.1 kg)   LMP 06/05/2014   BMI 23.35 kg/m  General appearance: alert and no distress                                        Assessment:   Osteoporosis with T score -2.8 in left femur   Plan:   1.  We discussed increasing calcium and Vit D intake to try and get 1200mg  total calcium and at least [phone] IU Vit D.   2.  Exercise recommended at least 30 minutes 3 times per week.  She is active but does not do any dedicated weight bearing exercise.  She is going to start. 3.  Recommendation to avoid heavy ETOH use.  4.  Counseled to avoid tobacco and second had smoke. 5.  Pharmacologic therapy therapy below discussed including risks and benefits:   Bisphosphonates po (Fosamax, Actonel, Boniva)  Bisphosphonate IV (Reclast)  Evista  Prolia subcutaneous   Forteo subcutaneous  Calcitonin nasal spray  Estrogen/progesterone  therapy She does not want to start any medications at this time. 6.  PTH and intact calcium obtained today 7.  Repeat bone density in 2 years.  About 20 minutes total spent with pt

## 2019-07-31 ENCOUNTER — Ambulatory Visit (INDEPENDENT_AMBULATORY_CARE_PROVIDER_SITE_OTHER): Payer: 59 | Admitting: Obstetrics & Gynecology

## 2019-07-31 ENCOUNTER — Encounter: Payer: Self-pay | Admitting: Obstetrics & Gynecology

## 2019-07-31 ENCOUNTER — Other Ambulatory Visit: Payer: Self-pay

## 2019-07-31 VITALS — BP 128/70 | HR 68 | Resp 12 | Ht 66.75 in | Wt 148.0 lb

## 2019-07-31 DIAGNOSIS — M810 Age-related osteoporosis without current pathological fracture: Secondary | ICD-10-CM

## 2019-08-01 LAB — PTH, INTACT AND CALCIUM
Calcium: 9.2 mg/dL (ref 8.7–10.3)
PTH: 40 pg/mL (ref 15–65)

## 2019-08-02 ENCOUNTER — Encounter: Payer: Self-pay | Admitting: Obstetrics & Gynecology

## 2019-08-07 ENCOUNTER — Encounter: Payer: Self-pay | Admitting: Obstetrics & Gynecology

## 2019-08-10 ENCOUNTER — Encounter: Payer: Self-pay | Admitting: Psychiatry

## 2019-08-10 ENCOUNTER — Ambulatory Visit (INDEPENDENT_AMBULATORY_CARE_PROVIDER_SITE_OTHER): Payer: 59 | Admitting: Psychiatry

## 2019-08-10 ENCOUNTER — Other Ambulatory Visit: Payer: Self-pay

## 2019-08-10 DIAGNOSIS — F418 Other specified anxiety disorders: Secondary | ICD-10-CM
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
## 2019-08-21 ENCOUNTER — Other Ambulatory Visit: Payer: Self-pay

## 2019-08-21 ENCOUNTER — Ambulatory Visit (INDEPENDENT_AMBULATORY_CARE_PROVIDER_SITE_OTHER): Payer: 59 | Admitting: Psychiatry

## 2019-08-21 ENCOUNTER — Encounter: Payer: Self-pay | Admitting: Psychiatry

## 2019-08-21 DIAGNOSIS — F418 Other specified anxiety disorders: Secondary | ICD-10-CM | POA: Diagnosis not present

## 2019-08-21 NOTE — Progress Notes (Signed)
      [facility] Counselor/Therapist Progress Note  Patient ID: Yvonne Hoover, MRN: [id number],    Date: 08/21/2019  Time Spent: 50 minutes   Treatment Type: Individual Therapy  Reported Symptoms: sad, anxious  Mental Status Exam:  Appearance:   Casual     Behavior:  Appropriate  Motor:  Normal  Speech/Language:   Clear and Coherent  Affect:  Appropriate  Mood:  anxious and sad  Thought process:  normal  Thought content:    WNL  Sensory/Perceptual disturbances:    WNL  Orientation:  oriented to person, place, time/date and situation  Attention:  Good  Concentration:  Good  Memory:  WNL  Fund of knowledge:   Good  Insight:    Good  Judgment:   Good  Impulse Control:  Good   Risk Assessment: Danger to Self:  No Self-injurious Behavior: No Danger to Others: No Duty to Warn:no Physical Aggression / Violence:No  Access to Firearms a concern: No  Gang Involvement:No   Subjective: The client states she has done a better job of getting rest and drinking water.  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  We discussed the clients health and how her lifestyle can either improve it or impair it.  The client agrees that she needs to do more things that are going to improve her mental health as well as her physical health.  She agreed to start hanging out with more friends.  Finds some companions that she can decompress with at the end of the day.  Make sure she spends time doing activities that refresh and replenish her.  Pay attention to her sleep hygiene.  Get more exercise.  Restart her omega-3 fatty acids.  "I really have not been taking care of myself."  Her stress around this is a subjective units of distress of 5.  I used eye-movement with the client to reduce her stress to less than 2.  As that happened she felt like it would be easier to do all the things she has agreed to do.  She will focus on that  for next session. She was also going to talk to her primary care physician about tapering off of her Cymbalta.  Interventions: Mindfulness Meditation, Motivational Interviewing, Solution-Oriented/Positive Psychology, [employer] Group Desensitization and Reprocessing (EMDR) and Insight-Oriented  Diagnosis:   ICD-10-CM   1. Depression with anxiety  F41.8     Plan: Be more consistent with her meditation, increase her mindfulness practices, sun exposure, social network, exercise, sleep hygiene, omega-3 fatty acids, talk to her physician about tapering off of the Cymbalta, explore supplements that can be helpful for mood stabilization.  [name], [facility]

## 2019-10-01 ENCOUNTER — Ambulatory Visit (INDEPENDENT_AMBULATORY_CARE_PROVIDER_SITE_OTHER): Payer: 59 | Admitting: Psychiatry

## 2019-10-01 ENCOUNTER — Other Ambulatory Visit: Payer: Self-pay

## 2019-10-01 ENCOUNTER — Encounter: Payer: Self-pay | Admitting: Psychiatry

## 2019-10-01 DIAGNOSIS — F418 Other specified anxiety disorders: Secondary | ICD-10-CM | POA: Diagnosis not present
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
## 2019-10-08 ENCOUNTER — Encounter: Payer: Self-pay | Admitting: Adult Health

## 2019-10-08 ENCOUNTER — Other Ambulatory Visit: Payer: Self-pay

## 2019-10-08 ENCOUNTER — Ambulatory Visit (INDEPENDENT_AMBULATORY_CARE_PROVIDER_SITE_OTHER): Payer: 59 | Admitting: Adult Health

## 2019-10-08 DIAGNOSIS — F331 Major depressive disorder, recurrent, moderate: Secondary | ICD-10-CM

## 2019-10-08 DIAGNOSIS — F411 Generalized anxiety disorder: Secondary | ICD-10-CM

## 2019-10-08 NOTE — Progress Notes (Signed)
Yvonne Hoover [id number] 26-May-1960 59 y.o.  Subjective:   Patient ID:  Yvonne Hoover is a 59 y.o. (DOB [birth year]) female.  Chief Complaint: No chief complaint on file.   HPI Yvonne Hoover presents to the office today for follow-up of MDD and GAD.  Describes mood today as "ok". Mood symptoms - reports some depression, anxiety and irritability. Stating "I feels vaguely depressed" - "there is a presence of sadness". Not sure if it is situational or not. Aunts visiting from [place] - "one aunt really challenges me". PCP discontinued Wellbutrin and Celexa since last visit. History of a seizure in childhood. Was started on Cymbalta - has taken 30mg  and 60mg  daily - currently at 60mg  daily. Some concerns with switching medications. Seeing therapist. Decreased interest and motivation. Taking medications as prescribed.  Energy levels decreased. Active, does not have a regular exercise routine.  Enjoys some usual interests and activities. Divorced. Single. Lives with elderly parents and her 5 cats. Has 2 elderly aunts visiting from [place] for a month. Spending time with family. Appetite adequate - "I know I need to eat and will eat". Weight stable. Sleeps well most nights. Averages 6 hours. Occasionally naps. Focus and concentration difficulties.Taking care of elderly parents. Works for an Health and safety inspector. Completing tasks. Managing aspects of household. Has wanted to drink alcohol.  Denies SI or HI. Denies AH or VH.  Previous medication trials: Celexa 20mg  daily. Wellbutrin XL 150mg , Adderall XR 20mg  daily   PHQ2-9     Office Visit from 10/25/2017 in [place] Office Visit from 03/22/2017 in [place] Office Visit from 02/16/2016 in [place] Office Visit from 08/18/2015 in [place] Office Visit from 02/25/2015 in [place]  PHQ-2 Total Score 0 0 0 0 1        Review of Systems:  Review of Systems  Musculoskeletal: Negative for gait problem.  Neurological: Negative for tremors.  Psychiatric/Behavioral:       Please refer to HPI    Medications: I have reviewed the patient's current medications.  Current Outpatient Medications  Medication Sig Dispense Refill  . amphetamine-dextroamphetamine (ADDERALL XR) 20 MG 24 hr capsule Take by mouth.    . ciprofloxacin (CIPRO) 500 MG tablet Take 500 mg by mouth 2 (two) times daily. (Patient not taking: Reported on 07/31/2019)    . DULoxetine (CYMBALTA) 60 MG capsule Take by mouth.    . fluocinonide ointment (LIDEX) 0.05 % APPLY TO AFFECTED AREA TWICE A DAY  0   No current facility-administered medications for this visit.    Medication Side Effects: None  Allergies:  Allergies  Allergen Reactions  . Amoxicillin-Pot Clavulanate Hives    Past Medical History:  Diagnosis Date  . Abnormal Pap smear of cervix    in her 20's, just repeat done, 2019 neg HPV HR +, 18/45 +  . Abnormal uterine bleeding   . Alcohol abuse, in remission   . Bronchiolitis   . Cancer ([place]) 07/2007   breast  . Cellulitis   . Depression   . Depression with anxiety 11/16/2010  . Fibroid 1992  . ITP (idiopathic thrombocytopenic purpura)    chronic  . STD (sexually transmitted disease)    positive HPV    Family History  Problem Relation Age of Onset  . Hypertension Mother   . Thyroid disease Mother   . Irregular heart beat Father   . Breast cancer  Maternal Aunt   . Brain cancer Maternal Aunt   . Tuberculosis Maternal Grandmother   . Diabetes Paternal Grandmother   . Cancer Maternal Grandfather        lung  . Uterine cancer Maternal Aunt     Social History   Socioeconomic History  . Marital status: Divorced    Spouse name: Not on file  . Number of children: Not on file  . Years of education: Not on file  . Highest education level: Not on file  Occupational History  . Not on file  Tobacco Use  .  Smoking status: Former Smoker    Quit date: 11/04/2012    Years since quitting: 6.9  . Smokeless tobacco: Never Used  . Tobacco comment: very rare  Vaping Use  . Vaping Use: Never used  Substance and Sexual Activity  . Alcohol use: Not Currently  . Drug use: No  . Sexual activity: Not Currently    Partners: Male    Birth control/protection: Post-menopausal  Other Topics Concern  . Not on file  Social History Narrative  . Not on file   Social Determinants of Health   Financial Resource Strain:   . Difficulty of Paying Living Expenses: Not on file  Food Insecurity:   . Worried About Charity fundraiser in the Last Year: Not on file  . Ran Out of Food in the Last Year: Not on file  Transportation Needs:   . Lack of Transportation (Medical): Not on file  . Lack of Transportation (Non-Medical): Not on file  Physical Activity:   . Days of Exercise per Week: Not on file  . Minutes of Exercise per Session: Not on file  Stress:   . Feeling of Stress : Not on file  Social Connections:   . Frequency of Communication with Friends and Family: Not on file  . Frequency of Social Gatherings with Friends and Family: Not on file  . Attends Religious Services: Not on file  . Active Member of Clubs or Organizations: Not on file  . Attends Archivist Meetings: Not on file  . Marital Status: Not on file  Intimate Partner Violence:   . Fear of Current or Ex-Partner: Not on file  . Emotionally Abused: Not on file  . Physically Abused: Not on file  . Sexually Abused: Not on file    Past Medical History, Surgical history, Social history, and Family history were reviewed and updated as appropriate.   Please see review of systems for further details on the patient's review from today.   Objective:   Physical Exam:  LMP 06/05/2014   Physical Exam Constitutional:      General: She is not in acute distress. Musculoskeletal:        General: No deformity.  Neurological:      Mental Status: She is alert and oriented to person, place, and time.     Coordination: Coordination normal.  Psychiatric:        Attention and Perception: Attention and perception normal. She does not perceive auditory or visual hallucinations.        Mood and Affect: Mood normal. Mood is not anxious or depressed. Affect is not labile, blunt, angry or inappropriate.        Speech: Speech normal.        Behavior: Behavior normal.        Thought Content: Thought content normal. Thought content is not paranoid or delusional. Thought content does not include homicidal or suicidal  ideation. Thought content does not include homicidal or suicidal plan.        Cognition and Memory: Cognition and memory normal.        Judgment: Judgment normal.     Comments: Insight intact     Lab Review:     Component Value Date/Time   NA 144 01/18/2017 1015   NA 141 12/20/2013 1004   K 3.9 01/18/2017 1015   K 4.1 12/20/2013 1004   CL 105 01/18/2017 1015   CL 107 02/16/2012 1436   CO2 24 01/18/2017 1015   CO2 28 12/20/2013 1004   GLUCOSE 81 01/18/2017 1015   GLUCOSE 87 06/06/2016 0601   GLUCOSE 60 (L) 12/20/2013 1004   GLUCOSE 83 02/16/2012 1436   BUN 11 01/18/2017 1015   BUN 6.9 (L) 12/20/2013 1004   CREATININE 0.75 01/18/2017 1015   CREATININE 0.7 12/20/2013 1004   CALCIUM 9.2 07/31/2019 1705   CALCIUM 9.3 12/20/2013 1004   PROT 6.8 01/18/2017 1015   PROT 6.7 12/20/2013 1004   ALBUMIN 4.3 01/18/2017 1015   ALBUMIN 3.5 12/20/2013 1004   AST 18 01/18/2017 1015   AST 19 12/20/2013 1004   ALT 11 01/18/2017 1015   ALT 12 12/20/2013 1004   ALKPHOS 73 01/18/2017 1015   ALKPHOS 73 12/20/2013 1004   BILITOT 0.3 01/18/2017 1015   BILITOT 0.39 12/20/2013 1004   GFRNONAA 88 01/18/2017 1015   GFRAA 102 01/18/2017 1015       Component Value Date/Time   WBC 7.8 10/25/2017 1138   WBC 6.4 08/03/2017 0855   RBC 4.70 10/25/2017 1138   RBC 4.78 08/03/2017 0855   HGB 14.3 10/25/2017 1138   HGB 14.1  01/02/2015 0825   HCT 42.8 10/25/2017 1138   HCT 43.7 01/02/2015 0825   PLT 51 (LL) 10/25/2017 1138   MCV 91 10/25/2017 1138   MCV 91.3 01/02/2015 0825   MCH 30.4 10/25/2017 1138   MCH 29.7 08/03/2017 0855   MCHC 33.4 10/25/2017 1138   MCHC 32.3 08/03/2017 0855   RDW 13.3 10/25/2017 1138   RDW 14.2 01/02/2015 0825   LYMPHSABS 2.2 10/25/2017 1138   LYMPHSABS 2.1 01/02/2015 0825   MONOABS 0.8 08/03/2017 0855   MONOABS 0.9 01/02/2015 0825   EOSABS 0.2 10/25/2017 1138   BASOSABS 0.2 10/25/2017 1138   BASOSABS 0.2 (H) 01/02/2015 0825    No results found for: POCLITH, LITHIUM   No results found for: PHENYTOIN, PHENOBARB, VALPROATE, CBMZ   .res Assessment: Plan:    Plan:  PDMP reviewed  1. Adderall XR 10mg  every morning - may switch to IR 2. Cymbalta 60mg  daily  RTC 4 weeks  Patient advised to contact office with any questions, adverse effects, or acute worsening in signs and symptoms.  Discussed potential benefits, risks, and side effects of stimulants with patient to include increased heart rate, palpitations, insomnia, increased anxiety, increased irritability, or decreased appetite.  Instructed patient to contact office if experiencing any significant tolerability issues.   Diagnoses and all orders for this visit:  Generalized anxiety disorder  Major depressive disorder, recurrent episode, moderate ([place])     Please see After Visit Summary for patient specific instructions.  Future Appointments  Date Time Provider [place]  10/15/2019 11:00 AM May, [place], [facility] CP-CP None  10/29/2019  9:00 AM May, [place], [facility] CP-CP None  11/19/2019 10:00 AM May, [place], [facility] CP-CP None  12/10/2019 10:00 AM May, [place], [facility] CP-CP None  12/18/2019 11:00 AM MayAlbertina Parr, [facility] CP-CP None  12/25/2019  9:00 AM MayAlbertina Parr, [facility] CP-CP None  01/01/2020 10:00 AM MayAlbertina Parr, [facility] CP-CP None    No orders of the defined types were placed in  this encounter.   -------------------------------

## 2019-10-15 ENCOUNTER — Encounter: Payer: Self-pay | Admitting: Psychiatry

## 2019-10-15 ENCOUNTER — Ambulatory Visit (INDEPENDENT_AMBULATORY_CARE_PROVIDER_SITE_OTHER): Payer: 59 | Admitting: Psychiatry

## 2019-10-15 ENCOUNTER — Other Ambulatory Visit: Payer: Self-pay

## 2019-10-15 DIAGNOSIS — F418 Other specified anxiety disorders: Secondary | ICD-10-CM | POA: Diagnosis not present
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
## 2019-10-25 ENCOUNTER — Telehealth: Payer: Self-pay | Admitting: Adult Health
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
## 2019-10-25 NOTE — Telephone Encounter (Signed)
Please review

## 2019-10-25 NOTE — Telephone Encounter (Signed)
Have her stop the Adderall with the negative side effects.

## 2019-10-26 NOTE — Telephone Encounter (Signed)
Yvonne Hoover, please let her know to stop Adderall.
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
## 2019-10-26 NOTE — Telephone Encounter (Signed)
Barnett Applebaum, please review follow up phone call with recommendations

## 2019-10-29 ENCOUNTER — Encounter: Payer: Self-pay | Admitting: Psychiatry

## 2019-10-29 ENCOUNTER — Ambulatory Visit (INDEPENDENT_AMBULATORY_CARE_PROVIDER_SITE_OTHER): Payer: 59 | Admitting: Psychiatry

## 2019-10-29 ENCOUNTER — Other Ambulatory Visit: Payer: Self-pay

## 2019-10-29 DIAGNOSIS — F418 Other specified anxiety disorders: Secondary | ICD-10-CM

## 2019-10-29 NOTE — Telephone Encounter (Signed)
Are you agreeing with the medication changes from her PCP as well?

## 2019-10-29 NOTE — Telephone Encounter (Signed)
Noted  
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
## 2019-10-29 NOTE — Telephone Encounter (Signed)
Reviewed note. We can have her referred to Neurology if PCP has not.

## 2019-11-05 ENCOUNTER — Ambulatory Visit: Payer: 59 | Admitting: Adult Health

## 2019-11-05 ENCOUNTER — Telehealth: Payer: Self-pay | Admitting: Adult Health

## 2019-11-05 NOTE — Telephone Encounter (Signed)
Noted  

## 2019-11-05 NOTE — Telephone Encounter (Signed)
Pt wants you to know that  She was able to get a last minute appointment with a neurologist so she rs her appointment from today

## 2019-11-05 NOTE — Telephone Encounter (Signed)
FYI

## 2019-11-13 ENCOUNTER — Ambulatory Visit (INDEPENDENT_AMBULATORY_CARE_PROVIDER_SITE_OTHER): Payer: 59 | Admitting: Adult Health

## 2019-11-13 ENCOUNTER — Encounter: Payer: Self-pay | Admitting: Adult Health

## 2019-11-13 ENCOUNTER — Other Ambulatory Visit: Payer: Self-pay

## 2019-11-13 DIAGNOSIS — F411 Generalized anxiety disorder: Secondary | ICD-10-CM | POA: Diagnosis not present

## 2019-11-13 DIAGNOSIS — F418 Other specified anxiety disorders: Secondary | ICD-10-CM | POA: Diagnosis not present

## 2019-11-13 NOTE — Progress Notes (Signed)
[name] [id number] 12-23-1960 59 y.o.  Subjective:   Patient ID:  Yvonne Hoover is a 59 y.o. (DOB [birth year]) female.  Chief Complaint: No chief complaint on file.   HPI [name] presents to the office today for follow-up of MDD and GAD.  Describes mood today as "ok". Mood symptoms - denies depression or irritability. Feels anxious. Stating "I have way too much on my plate right now". Seen by [facility] - has an MRI scheduled this afternoon. Has started decreasing Cymbalta to 30mg  every other day. Continues to take Lexapro at 10mg  daily. Seeing therapist - Josph Macho May. Decreased interest and motivation. Taking medications as prescribed.  Energy levels "ok". Active, does not have a regular exercise routine.  Enjoys some usual interests and activities. Divorced. Single. Lives with elderly parents and her 5 cats. Spending time with family. Appetite adequate. Weight stable. Sleeps well most nights. Averages 7 hours. Focus and concentration difficulties.Taking care of elderly parents. Works for an Health and safety inspector. Completing tasks. Managing aspects of household. Denies SI or HI. Denies AH or VH.  Previous medication trials: Celexa 20mg  daily. Wellbutrin XL 150mg , Adderall XR 20mg  daily    PHQ2-9     Office Visit from 10/25/2017 in [place] Office Visit from 03/22/2017 in [place] Office Visit from 02/16/2016 in [place] Office Visit from 08/18/2015 in [place] Office Visit from 02/25/2015 in [place]  PHQ-2 Total Score 0 0 0 0 1       Review of Systems:  Review of Systems  Musculoskeletal: Negative for gait problem.  Neurological: Negative for tremors.  Psychiatric/Behavioral:       Please refer to HPI    Medications: I have reviewed the patient's current medications.  Current Outpatient Medications  Medication Sig Dispense Refill  .  ciprofloxacin (CIPRO) 500 MG tablet Take 500 mg by mouth 2 (two) times daily. (Patient not taking: Reported on 07/31/2019)    . DULoxetine (CYMBALTA) 60 MG capsule Take by mouth.    . fluocinonide ointment (LIDEX) 0.05 % APPLY TO AFFECTED AREA TWICE A DAY  0   No current facility-administered medications for this visit.    Medication Side Effects: None  Allergies:  Allergies  Allergen Reactions  . Amoxicillin-Pot Clavulanate Hives    Past Medical History:  Diagnosis Date  . Abnormal Pap smear of cervix    in her 20's, just repeat done, 2019 neg HPV HR +, 18/45 +  . Abnormal uterine bleeding   . Alcohol abuse, in remission   . Bronchiolitis   . Cancer ([place]) 07/2007   breast  . Cellulitis   . Depression   . Depression with anxiety 11/16/2010  . Fibroid 1992  . ITP (idiopathic thrombocytopenic purpura)    chronic  . STD (sexually transmitted disease)    positive HPV    Family History  Problem Relation Age of Onset  . Hypertension Mother   . Thyroid disease Mother   . Irregular heart beat Father   . Breast cancer Maternal Aunt   . Brain cancer Maternal Aunt   . Tuberculosis Maternal Grandmother   . Diabetes Paternal Grandmother   . Cancer Maternal Grandfather        lung  . Uterine cancer Maternal Aunt     Social History   Socioeconomic History  . Marital status: Divorced    Spouse name: Not on file  . Number  of children: Not on file  . Years of education: Not on file  . Highest education level: Not on file  Occupational History  . Not on file  Tobacco Use  . Smoking status: Former Smoker    Quit date: 11/04/2012    Years since quitting: 7.0  . Smokeless tobacco: Never Used  . Tobacco comment: very rare  Vaping Use  . Vaping Use: Never used  Substance and Sexual Activity  . Alcohol use: Not Currently  . Drug use: No  . Sexual activity: Not Currently    Partners: Male    Birth control/protection: Post-menopausal  Other Topics Concern  . Not on file   Social History Narrative  . Not on file   Social Determinants of Health   Financial Resource Strain:   . Difficulty of Paying Living Expenses: Not on file  Food Insecurity:   . Worried About Charity fundraiser in the Last Year: Not on file  . Ran Out of Food in the Last Year: Not on file  Transportation Needs:   . Lack of Transportation (Medical): Not on file  . Lack of Transportation (Non-Medical): Not on file  Physical Activity:   . Days of Exercise per Week: Not on file  . Minutes of Exercise per Session: Not on file  Stress:   . Feeling of Stress : Not on file  Social Connections:   . Frequency of Communication with Friends and Family: Not on file  . Frequency of Social Gatherings with Friends and Family: Not on file  . Attends Religious Services: Not on file  . Active Member of Clubs or Organizations: Not on file  . Attends Archivist Meetings: Not on file  . Marital Status: Not on file  Intimate Partner Violence:   . Fear of Current or Ex-Partner: Not on file  . Emotionally Abused: Not on file  . Physically Abused: Not on file  . Sexually Abused: Not on file    Past Medical History, Surgical history, Social history, and Family history were reviewed and updated as appropriate.   Please see review of systems for further details on the patient's review from today.   Objective:   Physical Exam:  LMP 06/05/2014   Physical Exam Constitutional:      General: She is not in acute distress. Musculoskeletal:        General: No deformity.  Neurological:     Mental Status: She is alert and oriented to person, place, and time.     Coordination: Coordination normal.  Psychiatric:        Attention and Perception: Attention and perception normal. She does not perceive auditory or visual hallucinations.        Mood and Affect: Mood normal. Mood is not anxious or depressed. Affect is not labile, blunt, angry or inappropriate.        Speech: Speech normal.         Behavior: Behavior normal.        Thought Content: Thought content normal. Thought content is not paranoid or delusional. Thought content does not include homicidal or suicidal ideation. Thought content does not include homicidal or suicidal plan.        Cognition and Memory: Cognition and memory normal.        Judgment: Judgment normal.     Comments: Insight intact     Lab Review:     Component Value Date/Time   NA 144 01/18/2017 1015   NA 141 12/20/2013 1004  K 3.9 01/18/2017 1015   K 4.1 12/20/2013 1004   CL 105 01/18/2017 1015   CL 107 02/16/2012 1436   CO2 24 01/18/2017 1015   CO2 28 12/20/2013 1004   GLUCOSE 81 01/18/2017 1015   GLUCOSE 87 06/06/2016 0601   GLUCOSE 60 (L) 12/20/2013 1004   GLUCOSE 83 02/16/2012 1436   BUN 11 01/18/2017 1015   BUN 6.9 (L) 12/20/2013 1004   CREATININE 0.75 01/18/2017 1015   CREATININE 0.7 12/20/2013 1004   CALCIUM 9.2 07/31/2019 1705   CALCIUM 9.3 12/20/2013 1004   PROT 6.8 01/18/2017 1015   PROT 6.7 12/20/2013 1004   ALBUMIN 4.3 01/18/2017 1015   ALBUMIN 3.5 12/20/2013 1004   AST 18 01/18/2017 1015   AST 19 12/20/2013 1004   ALT 11 01/18/2017 1015   ALT 12 12/20/2013 1004   ALKPHOS 73 01/18/2017 1015   ALKPHOS 73 12/20/2013 1004   BILITOT 0.3 01/18/2017 1015   BILITOT 0.39 12/20/2013 1004   GFRNONAA 88 01/18/2017 1015   GFRAA 102 01/18/2017 1015       Component Value Date/Time   WBC 7.8 10/25/2017 1138   WBC 6.4 08/03/2017 0855   RBC 4.70 10/25/2017 1138   RBC 4.78 08/03/2017 0855   HGB 14.3 10/25/2017 1138   HGB 14.1 01/02/2015 0825   HCT 42.8 10/25/2017 1138   HCT 43.7 01/02/2015 0825   PLT 51 (LL) 10/25/2017 1138   MCV 91 10/25/2017 1138   MCV 91.3 01/02/2015 0825   MCH 30.4 10/25/2017 1138   MCH 29.7 08/03/2017 0855   MCHC 33.4 10/25/2017 1138   MCHC 32.3 08/03/2017 0855   RDW 13.3 10/25/2017 1138   RDW 14.2 01/02/2015 0825   LYMPHSABS 2.2 10/25/2017 1138   LYMPHSABS 2.1 01/02/2015 0825   MONOABS 0.8  08/03/2017 0855   MONOABS 0.9 01/02/2015 0825   EOSABS 0.2 10/25/2017 1138   BASOSABS 0.2 10/25/2017 1138   BASOSABS 0.2 (H) 01/02/2015 0825    No results found for: POCLITH, LITHIUM   No results found for: PHENYTOIN, PHENOBARB, VALPROATE, CBMZ   .res Assessment: Plan:    Plan:  PDMP reviewed  1. Cymbalta 30mg  - taking every other day for a week and leave off. 2. Continue Lexapro 10mg  daily for now.   RTC 4 weeks  Patient advised to contact office with any questions, adverse effects, or acute worsening in signs and symptoms.  Discussed potential benefits, risks, and side effects of stimulants with patient to include increased heart rate, palpitations, insomnia, increased anxiety, increased irritability, or decreased appetite.  Instructed patient to contact office if experiencing any significant tolerability issues.   Diagnoses and all orders for this visit:  Generalized anxiety disorder  Depression with anxiety     Please see After Visit Summary for patient specific instructions.  Future Appointments  Date Time Provider [place]  11/19/2019 10:00 AM May, [place], [facility] CP-CP None  12/10/2019 10:00 AM May, [place], [facility] CP-CP None  12/18/2019 11:00 AM May, [place], [facility] CP-CP None  12/25/2019  9:00 AM May, [place], [facility] CP-CP None  01/01/2020 10:00 AM May, [place], [facility] CP-CP None    No orders of the defined types were placed in this encounter.   -------------------------------

## 2019-11-19 ENCOUNTER — Ambulatory Visit (INDEPENDENT_AMBULATORY_CARE_PROVIDER_SITE_OTHER): Payer: 59 | Admitting: Psychiatry

## 2019-11-19 ENCOUNTER — Other Ambulatory Visit: Payer: Self-pay

## 2019-11-19 ENCOUNTER — Encounter: Payer: Self-pay | Admitting: Psychiatry

## 2019-11-19 DIAGNOSIS — F418 Other specified anxiety disorders: Secondary | ICD-10-CM

## 2019-11-19 NOTE — Progress Notes (Signed)
[facility] Counselor/Therapist Progress Note  Patient ID: Yvonne Hoover, MRN: [id number],    Date: 11/19/2019  Time Spent: 50 minutes   Treatment Type: Individual Therapy  Reported Symptoms: anger, sadness, anxiety  Mental Status Exam:  Appearance:   Casual     Behavior:  Appropriate  Motor:  Normal  Speech/Language:   Clear and Coherent  Affect:  Appropriate  Mood:  angry, anxious and sad  Thought process:  normal  Thought content:    WNL  Sensory/Perceptual disturbances:    WNL  Orientation:  oriented to person, place, time/date and situation  Attention:  Good  Concentration:  Good  Memory:  WNL  Fund of knowledge:   Good  Insight:    Good  Judgment:   Good  Impulse Control:  Good   Risk Assessment: Danger to Self:  No Self-injurious Behavior: No Danger to Others: No Duty to Warn:no Physical Aggression / Violence:No  Access to Firearms a concern: No  Gang Involvement:No   Subjective: The client has been concerned with cognitive issues.  She recently had an MRI ordered by her physician.  It came back as a "normal study."  She also has an EEG scheduled.  The client is in the process of weaning off her Cymbalta using a low-dose of Lexapro "I have less side effects."  I pointed out to the client that her high level of stress Ralph Benavidez be contributing to what she sees as cognitive issues. The client states that she feels a lot of suppressed anger coming up especially concerning her ex-boyfriend.  "He keeps texting me and sending me cute videos.  He is trying to reel me back in."  Today I used eye movement with the client focusing on her ex boyfriend.  Her negative cognition is, "I am not respected."  She feels anger in her biceps.  Her subjective units of distress is a 10.  As the client processed she stated that she feels this age old issue coming up inside of her about not being heard.  "It goes back to age 59.  I do not have to put up with him."  The client had set a boundary  asking him not to text her.  The client also realized that this triggers stuff about her mother as well.  She talked about how passive aggressively her mother would communicate with her.  Her mom cannot be direct about what she needs or wants.  The client realizes that her mother never had a voice.  The way that she responds to the client, walking around on eggshells, is probably the same way she responded to her stepmother.  This made sense to the client and she had more compassion for her mom.  I pointed out to the client that she has been much more intentional and assertive in her behavior with others.  She does need to work on her social network and connect with other people that respect and value her.  She agreed she needed to do that.  Her positive cognition at the end of the session was, "I do have a voice."  Her subjective units of distress was less than 2.  Interventions: Assertiveness/Communication, Motivational Interviewing, Solution-Oriented/Positive Psychology, [employer] Desensitization and Reprocessing (EMDR) and Insight-Oriented  Diagnosis:   ICD-10-CM   1. Depression with anxiety  F41.8     Plan: Assertiveness, boundaries, mood independent behavior, self-care, social network, radical acceptance with mom.  Kaiden Dardis, [facility]

## 2019-11-27 ENCOUNTER — Encounter: Payer: Self-pay | Admitting: Psychiatry

## 2019-11-27 ENCOUNTER — Ambulatory Visit (INDEPENDENT_AMBULATORY_CARE_PROVIDER_SITE_OTHER): Payer: 59 | Admitting: Psychiatry

## 2019-11-27 ENCOUNTER — Other Ambulatory Visit: Payer: Self-pay

## 2019-11-27 DIAGNOSIS — F418 Other specified anxiety disorders: Secondary | ICD-10-CM
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
## 2019-12-10 ENCOUNTER — Ambulatory Visit (INDEPENDENT_AMBULATORY_CARE_PROVIDER_SITE_OTHER): Payer: 59 | Admitting: Psychiatry

## 2019-12-10 ENCOUNTER — Encounter: Payer: Self-pay | Admitting: Psychiatry

## 2019-12-10 ENCOUNTER — Other Ambulatory Visit: Payer: Self-pay

## 2019-12-10 DIAGNOSIS — F418 Other specified anxiety disorders: Secondary | ICD-10-CM | POA: Diagnosis not present
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
## 2019-12-18 ENCOUNTER — Other Ambulatory Visit: Payer: Self-pay

## 2019-12-18 ENCOUNTER — Ambulatory Visit (INDEPENDENT_AMBULATORY_CARE_PROVIDER_SITE_OTHER): Payer: 59 | Admitting: Psychiatry

## 2019-12-18 DIAGNOSIS — F418 Other specified anxiety disorders: Secondary | ICD-10-CM

## 2019-12-19 ENCOUNTER — Encounter: Payer: Self-pay | Admitting: Psychiatry

## 2019-12-19 NOTE — Progress Notes (Signed)
      [facility] Counselor/Therapist Progress Note  Patient ID: Yvonne Hoover, MRN: [id number],    Date: 12/18/2019  Time Spent: 45 minutes   Treatment Type: Individual Therapy  Reported Symptoms: sad, irritable, anxious  Mental Status Exam:  Appearance:   Casual     Behavior:  Appropriate  Motor:  Normal  Speech/Language:   Clear and Coherent  Affect:  Depressed  Mood:  anxious, depressed, euthymic and sad  Thought process:  normal  Thought content:    WNL  Sensory/Perceptual disturbances:    WNL  Orientation:  oriented to person, place, time/date and situation  Attention:  Good  Concentration:  Good  Memory:  WNL  Fund of knowledge:   Good  Insight:    Good  Judgment:   Good  Impulse Control:  Good   Risk Assessment: Danger to Self:  No Self-injurious Behavior: No Danger to Others: No Duty to Warn:no Physical Aggression / Violence:No  Access to Firearms a concern: No  Gang Involvement:No   Subjective: The client states that her self talk has been fairly negative in her head.  "How did I contribute to my collapse?" What the client means by collapse is her emotionally falling apart.  "I could not seem to maintain a stable core with my siblings or with my female friend."  She says that her mistake was not telling her female friend not to come during the holidays because that was a bad idea.  She does not feel like she was heard by him or by her siblings.  She is hurt that her family does not love her in a way that makes sense to her.  I discussed with the client while using eye-movement with her, if her family has ever really loved her the way she needed?  She states that they have not.  I suggested it Tashay Bozich be wiser to release her expectations that they will somehow start loving her.  She will have to have radical acceptance with the way they are but accept the fact that they will never give her what she really needs.  I pointed out to the client that she has other friends outside  of her family that do meet that criteria.  They love her and care for her in a significant way.  She agrees.  As the client came to terms with this her subjective units of distress went from an 8 to less than 2.  She is planning for a beach trip to the [place] for 10 days.  She will spend that time journaling and caring for herself.  Her positive cognition at the end of the session was, "I deserve forgiveness."  Interventions: Assertiveness/Communication, Motivational Interviewing, Solution-Oriented/Positive Psychology, [employer] Group Desensitization and Reprocessing (EMDR) and Insight-Oriented  Diagnosis:   ICD-10-CM   1. Depression with anxiety  F41.8     Plan: Positive self talk, boundaries, assertiveness, journaling, mood independent behavior, positive self talk, 10 days at the beach.  Briana Farner, [facility]

## 2019-12-25 ENCOUNTER — Ambulatory Visit: Payer: 59 | Admitting: Psychiatry

## 2020-01-01 ENCOUNTER — Ambulatory Visit: Payer: 59 | Admitting: Psychiatry

## 2020-01-10 ENCOUNTER — Other Ambulatory Visit: Payer: Self-pay

## 2020-01-10 ENCOUNTER — Encounter: Payer: Self-pay | Admitting: Psychiatry

## 2020-01-10 ENCOUNTER — Ambulatory Visit (INDEPENDENT_AMBULATORY_CARE_PROVIDER_SITE_OTHER): Payer: 59 | Admitting: Psychiatry

## 2020-01-10 DIAGNOSIS — F418 Other specified anxiety disorders: Secondary | ICD-10-CM | POA: Diagnosis not present

## 2020-01-10 NOTE — Progress Notes (Signed)
      [facility] Counselor/Therapist Progress Note  Patient ID: Yvonne Hoover, MRN: [id number],    Date: 01/10/2020  Time Spent: 50 minutes   Treatment Type: Individual Therapy  Reported Symptoms: anxiety, sadness  Mental Status Exam:  Appearance:   Well Groomed     Behavior:  Appropriate  Motor:  Normal  Speech/Language:   Clear and Coherent  Affect:  Appropriate  Mood:  anxious and sad  Thought process:  normal  Thought content:    WNL  Sensory/Perceptual disturbances:    WNL  Orientation:  oriented to person, place, time/date and situation  Attention:  Good  Concentration:  Good  Memory:  WNL  Fund of knowledge:   Good  Insight:    Good  Judgment:   Good  Impulse Control:  Good   Risk Assessment: Danger to Self:  No Self-injurious Behavior: No Danger to Others: No Duty to Warn:no Physical Aggression / Violence:No  Access to Firearms a concern: No  Gang Involvement:No   Subjective: "I realize I tell myself I am mentally ill.  I cannot handle life.  I actually have had a lot on my plate and needed to grieve."  The client recounted her retreat to the beach.  She found that it really was the perfect fit for her.  She had gone to [place], [place] and found the isolation and the wildness of the place that she really has a resilience she did not realize.  "I know I need to change the narrative."  Today we started with the negative cognition, "I am mentally ill."  Using eye-movement the client began to process her feelings of unworthiness and incompetency.  She feels it in her chest.  It is a subjective units of distress of 5.  As the client processed, she realizes that believing she is mentally ill, "alleviates me responsibility".  The client can then allow life to happen to her rather than her having any control over things.  I challenged the client's belief that she was mentally ill and most likely had more of an information processing problem.  The client agreed that  this could be true.  She realizes that she gets easily triggered.  I suggested that she tends to personalize things too much.  Rather than responding instantly with anger or irritation maybe she could deflect what ever the person was doing and respond in a more neutral way?  The client thinks that this is a good idea.  We role modeled some scenarios which the client found very helpful.  She also realized that while she was at the beach she had been setting goals for herself which she sees as very helpful.  One of her goals heading forward is to do more writing and more art.  She also sees that she needs to find a way to support herself financially.  The client found that she is capable and her subjective units of distress was less than 1.  Interventions: Assertiveness/Communication, Roleplay, Motivational Interviewing, Solution-Oriented/Positive Psychology, [employer] Desensitization and Reprocessing (EMDR) and Insight-Oriented  Diagnosis:   ICD-10-CM   1. Depression with anxiety  F41.8     Plan: Mood independent behavior, role-play, positive self talk, self-care, continue to set goals, assertiveness, boundaries.  Gelene Mink Divine Imber, [facility]

## 2020-02-01 ENCOUNTER — Other Ambulatory Visit: Payer: Self-pay

## 2020-02-01 ENCOUNTER — Ambulatory Visit (INDEPENDENT_AMBULATORY_CARE_PROVIDER_SITE_OTHER): Payer: 59 | Admitting: Psychiatry

## 2020-02-01 ENCOUNTER — Encounter: Payer: Self-pay | Admitting: Psychiatry

## 2020-02-01 DIAGNOSIS — F418 Other specified anxiety disorders: Secondary | ICD-10-CM | POA: Diagnosis not present
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
## 2020-02-05 ENCOUNTER — Ambulatory Visit: Payer: 59 | Admitting: Certified Nurse Midwife

## 2020-02-11 ENCOUNTER — Ambulatory Visit (INDEPENDENT_AMBULATORY_CARE_PROVIDER_SITE_OTHER): Payer: 59 | Admitting: Psychiatry

## 2020-02-11 ENCOUNTER — Encounter: Payer: Self-pay | Admitting: Psychiatry

## 2020-02-11 ENCOUNTER — Other Ambulatory Visit: Payer: Self-pay

## 2020-02-11 DIAGNOSIS — F418 Other specified anxiety disorders: Secondary | ICD-10-CM
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
## 2020-02-18 ENCOUNTER — Encounter: Payer: Self-pay | Admitting: Psychiatry

## 2020-02-18 ENCOUNTER — Ambulatory Visit (INDEPENDENT_AMBULATORY_CARE_PROVIDER_SITE_OTHER): Payer: 59 | Admitting: Psychiatry

## 2020-02-18 ENCOUNTER — Other Ambulatory Visit: Payer: Self-pay

## 2020-02-18 DIAGNOSIS — F418 Other specified anxiety disorders: Secondary | ICD-10-CM | POA: Diagnosis not present
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
## 2020-02-25 ENCOUNTER — Ambulatory Visit: Payer: 59 | Admitting: Psychiatry

## 2020-03-03 ENCOUNTER — Encounter: Payer: Self-pay | Admitting: Psychiatry

## 2020-03-03 ENCOUNTER — Ambulatory Visit (INDEPENDENT_AMBULATORY_CARE_PROVIDER_SITE_OTHER): Payer: 59 | Admitting: Psychiatry

## 2020-03-03 ENCOUNTER — Other Ambulatory Visit: Payer: Self-pay

## 2020-03-03 DIAGNOSIS — F418 Other specified anxiety disorders: Secondary | ICD-10-CM

## 2020-03-03 NOTE — Progress Notes (Signed)
      [facility] Counselor/Therapist Progress Note  Patient ID: KATHARINE ROCHEFORT, MRN: [id number],    Date: 03/03/2020  Time Spent: 30 minutes   Treatment Type: Individual Therapy  Reported Symptoms: sad, anxious  Mental Status Exam:  Appearance:   Casual     Behavior:  Appropriate  Motor:  Normal  Speech/Language:   Clear and Coherent  Affect:  Appropriate  Mood:  anxious and sad  Thought process:  normal  Thought content:    WNL  Sensory/Perceptual disturbances:    WNL  Orientation:  oriented to person, place, time/date and situation  Attention:  Good  Concentration:  Good  Memory:  WNL  Fund of knowledge:   Good  Insight:    Good  Judgment:   Good  Impulse Control:  Good   Risk Assessment: Danger to Self:  No Self-injurious Behavior: No Danger to Others: No Duty to Warn:no Physical Aggression / Violence:No  Access to Firearms a concern: No  Gang Involvement:No   Subjective: The client overslept today.  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  She had signed up for their partners program which would allow her to get paid for her writing.  She was kicked off because she did not have 100 followers.  She said this made her sad and discouraged for a whole day.  "I have decided I am writing for myself.  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  She states she does want to learn how to write and make money.  When she was kicked off the program and felt it was very unjust and arbitrary to her.  We discussed radical acceptance and the need to weather these circumstances more effectively.  I quoted a [place] proverb to the client where they say, "there is no bad weather just bad clothing."  The client brightened considerably at this and felt like she could apply this to her life.  Interventions: Assertiveness/Communication, Motivational  Interviewing, Solution-Oriented/Positive Psychology and Insight-Oriented  Diagnosis:   ICD-10-CM   1. Depression with anxiety  F41.8     Plan: Radical acceptance, mood independent behavior, positive self talk, self-care, assertiveness, boundaries, journaling.  Wanda Cellucci, [facility]

## 2020-03-17 ENCOUNTER — Ambulatory Visit (INDEPENDENT_AMBULATORY_CARE_PROVIDER_SITE_OTHER): Payer: 59 | Admitting: Psychiatry

## 2020-03-17 ENCOUNTER — Other Ambulatory Visit: Payer: Self-pay

## 2020-03-17 ENCOUNTER — Encounter: Payer: Self-pay | Admitting: Psychiatry

## 2020-03-17 DIAGNOSIS — F418 Other specified anxiety disorders: Secondary | ICD-10-CM
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
## 2020-04-03 ENCOUNTER — Encounter: Payer: Self-pay | Admitting: Psychiatry

## 2020-04-03 ENCOUNTER — Ambulatory Visit (INDEPENDENT_AMBULATORY_CARE_PROVIDER_SITE_OTHER): Payer: 59 | Admitting: Psychiatry

## 2020-04-03 ENCOUNTER — Other Ambulatory Visit: Payer: Self-pay

## 2020-04-03 DIAGNOSIS — F418 Other specified anxiety disorders: Secondary | ICD-10-CM

## 2020-04-03 NOTE — Progress Notes (Signed)
      [facility] Counselor/Therapist Progress Note  Patient ID: Yvonne Hoover, MRN: [id number],    Date: 04/03/2020  Time Spent: 50 minutes   Treatment Type: Individual Therapy  Reported Symptoms: sad, anxious  Mental Status Exam:  Appearance:   Casual     Behavior:  Appropriate  Motor:  Normal  Speech/Language:   Clear and Coherent  Affect:  Tearful  Mood:  anxious and sad  Thought process:  normal  Thought content:    WNL  Sensory/Perceptual disturbances:    WNL  Orientation:  oriented to person, place, time/date and situation  Attention:  Good  Concentration:  Good  Memory:  WNL  Fund of knowledge:   Good  Insight:    Good  Judgment:   Good  Impulse Control:  Good   Risk Assessment: Danger to Self:  No Self-injurious Behavior: No Danger to Others: No Duty to Warn:no Physical Aggression / Violence:No  Access to Firearms a concern: No  Gang Involvement:No   Subjective: The client reports that her dad recently passed away.  His infected toe resulted in hospitalization with an amputation.  The client stated that at 40 he just did not do well.  He stayed in the hospital for 1 week and within a few days of being home he declined rapidly.  The client was there when her dad passed and was glad that she was.  She stated her siblings talk together, "we laughed, we cried, we yelled."  Everyone discussed the family dynamics and how invisible they all felt with their father.  He was well loved in the community.  The client states his memorial's this weekend and she hopes to have people tell stories about him that she can record. The client's daughter came down from up [place].  She disclosed that she is pregnant but has not told any of her aunts or uncles..  The client is upset with her siblings because of their lack of acceptance of her daughter's partner.  He is of mixed heritage [place] and indigenous [place].  She states that they do not feel he is good for her.  The client states  she likes them just fine.  She is sad that she cannot share the good news though.  I discussed with the client that I will be retiring at the end of April.  We discussed transitioning her to [place].  I use the bilateral stimulation and paddles as the client emoted and discussed her dad's death.  She was able to reduce her sadness from the subjective units of distress of 6 to less than 4.  Interventions: Assertiveness/Communication, Motivational Interviewing, Solution-Oriented/Positive Psychology, [employer] Desensitization and Reprocessing (EMDR) and Insight-Oriented  Diagnosis:   ICD-10-CM   1. Depression with anxiety  F41.8     Plan: Mood independent behavior, positive self talk, self-care, assertiveness, boundaries, grief work.  Yvonne Hoover, [facility]

## 2020-04-17 ENCOUNTER — Other Ambulatory Visit: Payer: Self-pay

## 2020-04-17 ENCOUNTER — Encounter: Payer: Self-pay | Admitting: Psychiatry

## 2020-04-17 ENCOUNTER — Ambulatory Visit (INDEPENDENT_AMBULATORY_CARE_PROVIDER_SITE_OTHER): Payer: 59 | Admitting: Psychiatry

## 2020-04-17 DIAGNOSIS — F418 Other specified anxiety disorders: Secondary | ICD-10-CM | POA: Diagnosis not present

## 2020-04-17 NOTE — Progress Notes (Signed)
      [facility] Counselor/Therapist Progress Note  Patient ID: Yvonne Hoover, MRN: [id number],    Date: 04/17/2020  Time Spent: 45 minutes   Treatment Type: Individual Therapy  Reported Symptoms: anxiety, sad, irritabl  Mental Status Exam:  Appearance:   Casual     Behavior:  Appropriate  Motor:  Normal  Speech/Language:   Clear and Coherent  Affect:  Depressed  Mood:  anxious, depressed, irritable and sad  Thought process:  normal  Thought content:    WNL  Sensory/Perceptual disturbances:    WNL  Orientation:  oriented to person, place, time/date and situation  Attention:  Good  Concentration:  Good  Memory:  WNL  Fund of knowledge:   Good  Insight:    Good  Judgment:   Good  Impulse Control:  Good   Risk Assessment: Danger to Self:  No Self-injurious Behavior: No Danger to Others: No Duty to Warn:no Physical Aggression / Violence:No  Access to Firearms a concern: No  Gang Involvement:No   Subjective: The client has been thinking at length about the whole 8 days that her dad was in the hospital.  "It made me angry."  The client stated that her dad's death was so unexpected.  She stated she got into an argument with her brother-in-law the day after the funeral around politics.  "He was very dogmatic and told me I was cynical."  Her older sister and brother-in-law are going to [place] for 3 weeks and wanted the client to stay on the property in care for their cat.  The client was not up to it emotionally and said no.  The client felt like her sisters response to her was very hurtful.  The client feels that it has been hard to engage the world in general.  Today we used eye-movement focusing on this with the client.  Her subjective units of distress was an 8.  As we processed she is visibly relaxed.  I encouraged the client to make sure that she was taking care of herself.  I asked her to think about what she needed to do to have some solitude to process through things.  The  client is committed to taking care of her mother.  I encouraged the client to revisit her Buddhist principles.  The client agreed.  As she continued to process her subjective units of distress was less than 4.  Interventions: Assertiveness/Communication, Mindfulness Meditation, Motivational Interviewing, Solution-Oriented/Positive Psychology, [employer] Desensitization and Reprocessing (EMDR) and Insight-Oriented  Diagnosis:   ICD-10-CM   1. Depression with anxiety  F41.8     Plan: Mood independent behavior, solitude, revisit her faith, positive self talk, self-care, assertiveness, boundaries.  Temisha Murley, [facility]

## 2020-05-01 ENCOUNTER — Ambulatory Visit: Payer: 59 | Admitting: Psychiatry

## 2020-05-15 ENCOUNTER — Ambulatory Visit: Payer: 59 | Admitting: Psychiatry

## 2020-05-29 ENCOUNTER — Ambulatory Visit: Payer: 59 | Admitting: Psychiatry

## 2020-06-26 ENCOUNTER — Encounter (HOSPITAL_BASED_OUTPATIENT_CLINIC_OR_DEPARTMENT_OTHER): Payer: Self-pay | Admitting: Obstetrics & Gynecology

## 2020-07-28 ENCOUNTER — Ambulatory Visit (INDEPENDENT_AMBULATORY_CARE_PROVIDER_SITE_OTHER): Payer: 59 | Admitting: Psychiatry

## 2020-07-28 ENCOUNTER — Encounter: Payer: Self-pay | Admitting: Psychiatry

## 2020-07-28 ENCOUNTER — Other Ambulatory Visit: Payer: Self-pay

## 2020-07-28 DIAGNOSIS — F411 Generalized anxiety disorder: Secondary | ICD-10-CM
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
## 2020-08-27 ENCOUNTER — Ambulatory Visit: Payer: 59 | Admitting: Psychiatry

## 2020-09-09 ENCOUNTER — Ambulatory Visit: Payer: 59 | Admitting: Psychiatry

## 2020-09-29 ENCOUNTER — Ambulatory Visit: Payer: 59 | Admitting: Psychiatry

## 2020-10-13 ENCOUNTER — Ambulatory Visit: Payer: 59 | Admitting: Psychiatry

## 2020-10-27 ENCOUNTER — Ambulatory Visit: Payer: 59 | Admitting: Psychiatry

## 2021-03-05 ENCOUNTER — Ambulatory Visit (HOSPITAL_BASED_OUTPATIENT_CLINIC_OR_DEPARTMENT_OTHER): Payer: Self-pay | Admitting: Obstetrics & Gynecology
